# Patient Record
Sex: Female | Born: 1940
Health system: Southern US, Community
[De-identification: ages and names within clinical notes are randomized; demographics above are authoritative.]

## PROBLEM LIST (undated history)

## (undated) HISTORY — PX: APPENDECTOMY: SHX54

## (undated) HISTORY — PX: FOOT SURGERY: SHX648

## (undated) HISTORY — PX: CHOLECYSTECTOMY: SHX55

## (undated) HISTORY — PX: OTHER SURGICAL HISTORY: SHX169

---

## 1999-01-30 ENCOUNTER — Other Ambulatory Visit: Admission: RE | Admit: 1999-01-30 | Discharge: 1999-01-30 | Payer: Self-pay | Admitting: Family Medicine

## 2002-05-28 ENCOUNTER — Other Ambulatory Visit: Admission: RE | Admit: 2002-05-28 | Discharge: 2002-05-28 | Payer: Self-pay | Admitting: Family Medicine

## 2002-12-24 ENCOUNTER — Encounter: Admission: RE | Admit: 2002-12-24 | Discharge: 2002-12-24 | Payer: Self-pay | Admitting: Internal Medicine

## 2004-03-06 ENCOUNTER — Ambulatory Visit: Payer: Self-pay | Admitting: Family Medicine

## 2004-04-20 ENCOUNTER — Ambulatory Visit: Payer: Self-pay | Admitting: Family Medicine

## 2004-04-23 ENCOUNTER — Ambulatory Visit: Payer: Self-pay | Admitting: Family Medicine

## 2004-05-04 ENCOUNTER — Ambulatory Visit: Payer: Self-pay | Admitting: Family Medicine

## 2004-12-25 ENCOUNTER — Ambulatory Visit: Payer: Self-pay | Admitting: Family Medicine

## 2005-04-06 ENCOUNTER — Ambulatory Visit: Payer: Self-pay | Admitting: Family Medicine

## 2015-08-10 DIAGNOSIS — K219 Gastro-esophageal reflux disease without esophagitis: Secondary | ICD-10-CM

## 2015-08-10 DIAGNOSIS — J309 Allergic rhinitis, unspecified: Secondary | ICD-10-CM

## 2015-08-10 DIAGNOSIS — F3340 Major depressive disorder, recurrent, in remission, unspecified: Secondary | ICD-10-CM

## 2015-08-10 DIAGNOSIS — M1990 Unspecified osteoarthritis, unspecified site: Secondary | ICD-10-CM

## 2015-08-10 DIAGNOSIS — G894 Chronic pain syndrome: Secondary | ICD-10-CM

## 2015-08-10 HISTORY — DX: Allergic rhinitis, unspecified: J30.9

## 2015-08-10 HISTORY — DX: Major depressive disorder, recurrent, in remission, unspecified: F33.40

## 2015-08-10 HISTORY — DX: Unspecified osteoarthritis, unspecified site: M19.90

## 2015-08-10 HISTORY — DX: Gastro-esophageal reflux disease without esophagitis: K21.9

## 2015-08-10 HISTORY — DX: Chronic pain syndrome: G89.4

## 2015-08-18 DIAGNOSIS — I10 Essential (primary) hypertension: Secondary | ICD-10-CM

## 2015-08-18 HISTORY — DX: Essential (primary) hypertension: I10

## 2015-10-03 DIAGNOSIS — Z131 Encounter for screening for diabetes mellitus: Secondary | ICD-10-CM | POA: Insufficient documentation

## 2015-10-03 DIAGNOSIS — E782 Mixed hyperlipidemia: Secondary | ICD-10-CM

## 2015-10-03 DIAGNOSIS — M810 Age-related osteoporosis without current pathological fracture: Secondary | ICD-10-CM

## 2015-10-03 HISTORY — DX: Mixed hyperlipidemia: E78.2

## 2015-10-03 HISTORY — DX: Age-related osteoporosis without current pathological fracture: M81.0

## 2015-10-03 HISTORY — DX: Encounter for screening for diabetes mellitus: Z13.1

## 2016-07-17 DIAGNOSIS — J0100 Acute maxillary sinusitis, unspecified: Secondary | ICD-10-CM | POA: Diagnosis not present

## 2016-07-17 DIAGNOSIS — J209 Acute bronchitis, unspecified: Secondary | ICD-10-CM | POA: Diagnosis not present

## 2016-08-18 DIAGNOSIS — M722 Plantar fascial fibromatosis: Secondary | ICD-10-CM

## 2016-08-18 HISTORY — DX: Plantar fascial fibromatosis: M72.2

## 2016-08-21 DIAGNOSIS — I10 Essential (primary) hypertension: Secondary | ICD-10-CM | POA: Diagnosis not present

## 2016-08-21 DIAGNOSIS — R739 Hyperglycemia, unspecified: Secondary | ICD-10-CM | POA: Diagnosis not present

## 2016-08-29 DIAGNOSIS — F3340 Major depressive disorder, recurrent, in remission, unspecified: Secondary | ICD-10-CM | POA: Diagnosis not present

## 2016-08-29 DIAGNOSIS — G894 Chronic pain syndrome: Secondary | ICD-10-CM | POA: Diagnosis not present

## 2016-08-29 DIAGNOSIS — R7303 Prediabetes: Secondary | ICD-10-CM | POA: Diagnosis not present

## 2016-08-29 DIAGNOSIS — K219 Gastro-esophageal reflux disease without esophagitis: Secondary | ICD-10-CM | POA: Diagnosis not present

## 2016-08-29 DIAGNOSIS — Z13220 Encounter for screening for lipoid disorders: Secondary | ICD-10-CM | POA: Diagnosis not present

## 2016-08-29 DIAGNOSIS — Z298 Encounter for other specified prophylactic measures: Secondary | ICD-10-CM | POA: Diagnosis not present

## 2016-09-19 DIAGNOSIS — Z1231 Encounter for screening mammogram for malignant neoplasm of breast: Secondary | ICD-10-CM | POA: Diagnosis not present

## 2017-03-01 DIAGNOSIS — K219 Gastro-esophageal reflux disease without esophagitis: Secondary | ICD-10-CM | POA: Diagnosis not present

## 2017-03-01 DIAGNOSIS — Z131 Encounter for screening for diabetes mellitus: Secondary | ICD-10-CM | POA: Diagnosis not present

## 2017-03-01 DIAGNOSIS — R7303 Prediabetes: Secondary | ICD-10-CM | POA: Diagnosis not present

## 2017-03-01 DIAGNOSIS — F3340 Major depressive disorder, recurrent, in remission, unspecified: Secondary | ICD-10-CM | POA: Diagnosis not present

## 2017-03-01 DIAGNOSIS — I10 Essential (primary) hypertension: Secondary | ICD-10-CM | POA: Diagnosis not present

## 2017-03-01 DIAGNOSIS — Z13220 Encounter for screening for lipoid disorders: Secondary | ICD-10-CM | POA: Diagnosis not present

## 2017-03-01 DIAGNOSIS — G894 Chronic pain syndrome: Secondary | ICD-10-CM | POA: Diagnosis not present

## 2017-03-08 DIAGNOSIS — Z961 Presence of intraocular lens: Secondary | ICD-10-CM | POA: Diagnosis not present

## 2017-06-03 DIAGNOSIS — Z9181 History of falling: Secondary | ICD-10-CM | POA: Diagnosis not present

## 2017-06-03 DIAGNOSIS — F419 Anxiety disorder, unspecified: Secondary | ICD-10-CM | POA: Diagnosis not present

## 2017-06-03 DIAGNOSIS — Z6825 Body mass index (BMI) 25.0-25.9, adult: Secondary | ICD-10-CM | POA: Diagnosis not present

## 2017-06-03 DIAGNOSIS — K219 Gastro-esophageal reflux disease without esophagitis: Secondary | ICD-10-CM | POA: Diagnosis not present

## 2017-06-03 DIAGNOSIS — J45909 Unspecified asthma, uncomplicated: Secondary | ICD-10-CM | POA: Diagnosis not present

## 2017-06-03 DIAGNOSIS — Z1389 Encounter for screening for other disorder: Secondary | ICD-10-CM | POA: Diagnosis not present

## 2017-06-03 DIAGNOSIS — M1990 Unspecified osteoarthritis, unspecified site: Secondary | ICD-10-CM | POA: Diagnosis not present

## 2017-06-03 DIAGNOSIS — Z1331 Encounter for screening for depression: Secondary | ICD-10-CM | POA: Diagnosis not present

## 2017-06-03 DIAGNOSIS — E663 Overweight: Secondary | ICD-10-CM | POA: Diagnosis not present

## 2017-06-03 DIAGNOSIS — H9193 Unspecified hearing loss, bilateral: Secondary | ICD-10-CM | POA: Diagnosis not present

## 2017-06-03 DIAGNOSIS — F329 Major depressive disorder, single episode, unspecified: Secondary | ICD-10-CM | POA: Diagnosis not present

## 2017-09-10 DIAGNOSIS — Z0000 Encounter for general adult medical examination without abnormal findings: Secondary | ICD-10-CM | POA: Diagnosis not present

## 2017-09-10 DIAGNOSIS — E782 Mixed hyperlipidemia: Secondary | ICD-10-CM | POA: Diagnosis not present

## 2017-09-10 DIAGNOSIS — G894 Chronic pain syndrome: Secondary | ICD-10-CM | POA: Diagnosis not present

## 2017-09-10 DIAGNOSIS — I10 Essential (primary) hypertension: Secondary | ICD-10-CM | POA: Diagnosis not present

## 2017-09-10 DIAGNOSIS — F3340 Major depressive disorder, recurrent, in remission, unspecified: Secondary | ICD-10-CM | POA: Diagnosis not present

## 2017-09-10 DIAGNOSIS — Z131 Encounter for screening for diabetes mellitus: Secondary | ICD-10-CM | POA: Diagnosis not present

## 2017-09-10 DIAGNOSIS — K219 Gastro-esophageal reflux disease without esophagitis: Secondary | ICD-10-CM | POA: Diagnosis not present

## 2017-09-14 DIAGNOSIS — R21 Rash and other nonspecific skin eruption: Secondary | ICD-10-CM | POA: Diagnosis not present

## 2017-09-14 DIAGNOSIS — S40869A Insect bite (nonvenomous) of unspecified upper arm, initial encounter: Secondary | ICD-10-CM | POA: Diagnosis not present

## 2017-11-04 ENCOUNTER — Ambulatory Visit (INDEPENDENT_AMBULATORY_CARE_PROVIDER_SITE_OTHER): Payer: Medicare Other

## 2017-11-04 ENCOUNTER — Encounter: Payer: Self-pay | Admitting: Podiatry

## 2017-11-04 ENCOUNTER — Ambulatory Visit (INDEPENDENT_AMBULATORY_CARE_PROVIDER_SITE_OTHER): Payer: Medicare Other | Admitting: Podiatry

## 2017-11-04 VITALS — BP 141/71 | HR 79 | Temp 98.9°F | Resp 16 | Ht 66.0 in | Wt 160.0 lb

## 2017-11-04 DIAGNOSIS — M2041 Other hammer toe(s) (acquired), right foot: Secondary | ICD-10-CM

## 2017-11-04 DIAGNOSIS — Q828 Other specified congenital malformations of skin: Secondary | ICD-10-CM

## 2017-11-04 DIAGNOSIS — M2011 Hallux valgus (acquired), right foot: Secondary | ICD-10-CM | POA: Diagnosis not present

## 2017-11-04 NOTE — Progress Notes (Signed)
   Subjective:    Patient ID: Maria Navarro, female    DOB: August 06, 1940, 77 y.o.   MRN: [id number]  HPI    Review of Systems  All other systems reviewed and are negative.      Objective:   Physical Exam        Assessment & Plan:

## 2017-11-20 NOTE — Progress Notes (Signed)
  Subjective:  Patient ID: Maria Navarro, female    DOB: 1941-02-06,  MRN: [id number]  Chief Complaint  Patient presents with  . Callouses    R hallux lateral side and 2nd toe medial side x 15 years; tender Tx: trimming  and OTC padding   77 y.o. female presents with the above complaint.  Reports pain to the outside of the right great toe.  And inside of the second toe.  Present for 15 years tender.  Reports she gets calluses to the area.  Has tried trimming and over-the-counter pads. ABN on file.  No past medical history on file.   Current Outpatient Medications:  .  aspirin 81 MG chewable tablet, Chew by mouth daily., Disp: , Rfl:  .  buPROPion (WELLBUTRIN XL) 300 MG 24 hr tablet, Take 300 mg by mouth daily., Disp: , Rfl:  .  traMADol (ULTRAM) 50 MG tablet, Take by mouth every 6 (six) hours as needed., Disp: , Rfl:   Not on File Review of Systems: Negative except as noted in the HPI. Denies N/V/F/Ch. Objective:   Vitals:   11/04/17 1511  BP: (!) 141/71  Pulse: 79  Resp: 16  Temp: 98.9 F (37.2 C)   General AA&O x3. Normal mood and affect.  Vascular Dorsalis pedis and posterior tibial pulses  present 2+ bilaterally  Capillary refill normal to all digits. Pedal hair growth normal.  Neurologic Epicritic sensation grossly present.  Dermatologic No open lesions. Interspaces clear of maceration. Nails well groomed and normal in appearance.  Orthopedic: MMT 5/5 in dorsiflexion, plantarflexion, inversion, and eversion. Normal joint ROM without pain or crepitus.   Assessment & Plan:  Patient was evaluated and treated and all questions answered.  Heloma molle first second toes right foot.  Underlying hammertoe deformity second toe -X-rays taken reviewed.  Evidence of prior bunionectomy.  Degenerative changes noted to the first MPJ head.  Cerclage wire present.  Lesion markers pointing to the effacing sides of the first IPJ and second PIPJ -Lesion palliatively pared.s discussed  with patient that due to the above deformities would benefit from surgery to straighten the first toe.  Would likely benefit from first MPJ replacement with second hammertoe correction.  Patient states that she cannot pursue surgery at this time she has no one to assist her.  We will plan for surgery early next year.  Procedure: Paring of Lesion Rationale: painful hyperkeratotic lesion Type of Debridement: manual, sharp debridement. Instrumentation: 312 blade Number of Lesions: 2   Return in about 2 months (around 01/05/2018) for surgery planning.

## 2017-12-12 ENCOUNTER — Other Ambulatory Visit: Payer: Self-pay | Admitting: Podiatry

## 2017-12-12 DIAGNOSIS — M2011 Hallux valgus (acquired), right foot: Secondary | ICD-10-CM

## 2017-12-12 DIAGNOSIS — Q828 Other specified congenital malformations of skin: Secondary | ICD-10-CM

## 2017-12-12 DIAGNOSIS — M2041 Other hammer toe(s) (acquired), right foot: Secondary | ICD-10-CM

## 2018-01-06 ENCOUNTER — Ambulatory Visit (INDEPENDENT_AMBULATORY_CARE_PROVIDER_SITE_OTHER): Payer: Medicare Other | Admitting: Podiatry

## 2018-01-06 DIAGNOSIS — Q828 Other specified congenital malformations of skin: Secondary | ICD-10-CM | POA: Diagnosis not present

## 2018-01-06 DIAGNOSIS — M2011 Hallux valgus (acquired), right foot: Secondary | ICD-10-CM | POA: Diagnosis not present

## 2018-01-06 DIAGNOSIS — M2041 Other hammer toe(s) (acquired), right foot: Secondary | ICD-10-CM | POA: Diagnosis not present

## 2018-01-06 DIAGNOSIS — T8484XA Pain due to internal orthopedic prosthetic devices, implants and grafts, initial encounter: Secondary | ICD-10-CM

## 2018-01-06 DIAGNOSIS — M205X1 Other deformities of toe(s) (acquired), right foot: Secondary | ICD-10-CM

## 2018-01-06 DIAGNOSIS — M2021 Hallux rigidus, right foot: Secondary | ICD-10-CM | POA: Diagnosis not present

## 2018-01-06 NOTE — Progress Notes (Signed)
  Subjective:  Patient ID: Maria Navarro, female    DOB: 1940-08-30,  MRN: [id number]  Chief Complaint  Patient presents with  . surgery consultation    Surgery consulation for 1st MPJ replacement and 2nd HT correction    77 y.o. female presents with the above complaint.  Still having pain with the first and second toes especially due to rubbing.  Would like to plan for surgery mid November.  Review of Systems: Negative except as noted in the HPI. Denies N/V/F/Ch.  No past medical history on file.  Current Outpatient Medications:  .  aspirin 81 MG chewable tablet, Chew by mouth daily., Disp: , Rfl:  .  buPROPion (WELLBUTRIN XL) 300 MG 24 hr tablet, Take 300 mg by mouth daily., Disp: , Rfl:  .  traMADol (ULTRAM) 50 MG tablet, Take by mouth every 6 (six) hours as needed., Disp: , Rfl:   Social History   Tobacco Use  Smoking Status Never Smoker  Smokeless Tobacco Never Used    Not on File Objective:  There were no vitals filed for this visit. There is no height or weight on file to calculate BMI. Constitutional Well developed. Well nourished.  Vascular Dorsalis pedis pulses palpable bilaterally. Posterior tibial pulses palpable bilaterally. Capillary refill normal to all digits.  No cyanosis or clubbing noted. Pedal hair growth normal.  Neurologic Normal speech. Oriented to person, place, and time. Epicritic sensation to light touch grossly present bilaterally.  Dermatologic Nails well groomed and normal in appearance. No open wounds. Hyperkeratotic lesions to the lateral hallux IPJ medial second PIPJ  Orthopedic: Normal joint ROM without pain or crepitus bilaterally. HAV deformity right with second hammertoe.   Radiographs: None today prior x-rays reviewed. Assessment:   1. Hallux valgus, right   2. Hammer toe of right foot   3. Porokeratosis   4. Contracture of toe of right foot   5. Hallux rigidus of right foot   6. Painful orthopaedic hardware [facility])    Plan:    Patient was evaluated and treated and all questions answered.  Hallux valgus, hallux limitus, hammertoe second toe right foot -Prior x-rays reviewed. -Discussed surgical plan with patient.  Would benefit from first metatarsophalangeal joint replacement to correct her bunion deformity.  Would benefit from removal of the cerclage wire hardware.  Would benefit from second hammertoe correction with pin versus screw fixation.  Would consider screw for faster recovery.  Given consistent callus formation would benefit from hemi-condylectomy of the hallux to prevent further callus formation. -Patient has failed all conservative therapy and wishes to proceed with surgical intervention. All risks, benefits, and alternatives discussed with patient. No guarantees given. Consent reviewed and signed by patient. -Planned procedures: Right first metatarsal phalangeal joint replacement.  Second hammertoe correction with pin fixation versus screw.  Removal of hardware.  Return for aftter surgery.

## 2018-01-06 NOTE — Patient Instructions (Signed)
Pre-Operative Instructions  Congratulations, you have decided to take an important step towards improving your quality of life.  You can be assured that the doctors and staff at [facility] will be with you every step of the way.  Here are some important things you should know:  1. Plan to be at the surgery center/hospital at least 1 (one) hour prior to your scheduled time, unless otherwise directed by the surgical center/hospital staff.  You must have a responsible adult accompany you, remain during the surgery and drive you home.  Make sure you have directions to the surgical center/hospital to ensure you arrive on time. 2. If you are having surgery at [facility] or [facility], you will need a copy of your medical history and physical form from your family physician within one month prior to the date of surgery. We will give you a form for your primary physician to complete.  3. We make every effort to accommodate the date you request for surgery.  However, there are times where surgery dates or times have to be moved.  We will contact you as soon as possible if a change in schedule is required.   4. No aspirin/ibuprofen for one week before surgery.  If you are on aspirin, any non-steroidal anti-inflammatory medications (Mobic, Aleve, Ibuprofen) should not be taken seven (7) days prior to your surgery.  You make take Tylenol for pain prior to surgery.  5. Medications - If you are taking daily heart and blood pressure medications, seizure, reflux, allergy, asthma, anxiety, pain or diabetes medications, make sure you notify the surgery center/hospital before the day of surgery so they can tell you which medications you should take or avoid the day of surgery. 6. No food or drink after midnight the night before surgery unless directed otherwise by surgical center/hospital staff. 7. No alcoholic beverages 24-hours prior to surgery.  No smoking 24-hours prior or 24-hours after  surgery. 8. Wear loose pants or shorts. They should be loose enough to fit over bandages, boots, and casts. 9. Don't wear slip-on shoes. Sneakers are preferred. 10. Bring your boot with you to the surgery center/hospital.  Also bring crutches or a walker if your physician has prescribed it for you.  If you do not have this equipment, it will be provided for you after surgery. 11. If you have not been contacted by the surgery center/hospital by the day before your surgery, call to confirm the date and time of your surgery. 12. Leave-time from work may vary depending on the type of surgery you have.  Appropriate arrangements should be made prior to surgery with your employer. 13. Prescriptions will be provided immediately following surgery by your doctor.  Fill these as soon as possible after surgery and take the medication as directed. Pain medications will not be refilled on weekends and must be approved by the doctor. 14. Remove nail polish on the operative foot and avoid getting pedicures prior to surgery. 15. Wash the night before surgery.  The night before surgery wash the foot and leg well with water and the antibacterial soap provided. Be sure to pay special attention to beneath the toenails and in between the toes.  Wash for at least three (3) minutes. Rinse thoroughly with water and dry well with a towel.  Perform this wash unless told not to do so by your physician.  Enclosed: 1 Ice pack (please put in freezer the night before surgery)   1 Hibiclens skin cleaner     Pre-op instructions  If you have any questions regarding the instructions, please do not hesitate to call our office.  [place]: [address], [place], [place] [address] -- [phone]  [place]: [address] [place], [place] [address] -- [phone]  [place]: [address]  [place], [place] [address] -- [phone]  [place]: [address], [address], [place], [place] [address] -- [phone]  Website:  [email or web address] 

## 2018-01-08 ENCOUNTER — Telehealth: Payer: Self-pay | Admitting: *Deleted

## 2018-01-08 NOTE — Telephone Encounter (Signed)
"  I'm calling to schedule my surgery with Dr. Samuella Cota."  Have you signed consent forms?  "Yes, I have."  I need to call you back because I don't have your paperwork at this time.  Did Dr. Samuella Cota tell you which location?  "[facility] is what he told me.  I'd like to do it in mid-October."  I'll give you a call once I get your surgery information.

## 2018-01-09 NOTE — Telephone Encounter (Signed)
"  I'm calling to get a date for [facility] surgery at [facility]."  I can get a tentative date for you.  I can't guarantee because Dr. Samuella CotaPrice doesn't have any block time at [facility].  Do you have a date in mind?  "I'd like to do it mid-October."  He could possibly do it on October 21, 20, 15, or 14.  "I'd like to do it on October 21."  I can't guarantee the date.  Like I mentioned he does not have any block time at [facility].  I would not be able to verify it until the week before the surgery date.  "Well that is not helpful.  Can he do it in [place] quicker and without all these problems and on that date?"  Yes, he can do it at [facility] on October 23.  You will not need the history and physical form.  "Okay, let's do it there.  My surgery will be done that morning?"  Yes, your surgery will be done that morning.  "Anything I need to do?" Can you go by the [place] office to pick up the surgical center's brochure?  "Yes, I can go down there and pick it up."  You need to register with the surgical center online, instructions are in the brochure.  "I don't have a computer."  Well, they will call you to get the needed information.  "Okay, thank you so much."

## 2018-02-11 DIAGNOSIS — M791 Myalgia, unspecified site: Secondary | ICD-10-CM | POA: Diagnosis not present

## 2018-02-11 DIAGNOSIS — M9905 Segmental and somatic dysfunction of pelvic region: Secondary | ICD-10-CM | POA: Diagnosis not present

## 2018-02-11 DIAGNOSIS — M9902 Segmental and somatic dysfunction of thoracic region: Secondary | ICD-10-CM | POA: Diagnosis not present

## 2018-02-11 DIAGNOSIS — M9904 Segmental and somatic dysfunction of sacral region: Secondary | ICD-10-CM | POA: Diagnosis not present

## 2018-02-11 DIAGNOSIS — M9901 Segmental and somatic dysfunction of cervical region: Secondary | ICD-10-CM | POA: Diagnosis not present

## 2018-02-11 DIAGNOSIS — M50322 Other cervical disc degeneration at C5-C6 level: Secondary | ICD-10-CM | POA: Diagnosis not present

## 2018-02-11 DIAGNOSIS — M50321 Other cervical disc degeneration at C4-C5 level: Secondary | ICD-10-CM | POA: Diagnosis not present

## 2018-02-11 DIAGNOSIS — M5383 Other specified dorsopathies, cervicothoracic region: Secondary | ICD-10-CM | POA: Diagnosis not present

## 2018-02-12 DIAGNOSIS — M5383 Other specified dorsopathies, cervicothoracic region: Secondary | ICD-10-CM | POA: Diagnosis not present

## 2018-02-12 DIAGNOSIS — M9905 Segmental and somatic dysfunction of pelvic region: Secondary | ICD-10-CM | POA: Diagnosis not present

## 2018-02-12 DIAGNOSIS — M9904 Segmental and somatic dysfunction of sacral region: Secondary | ICD-10-CM | POA: Diagnosis not present

## 2018-02-12 DIAGNOSIS — M50321 Other cervical disc degeneration at C4-C5 level: Secondary | ICD-10-CM | POA: Diagnosis not present

## 2018-02-12 DIAGNOSIS — M50322 Other cervical disc degeneration at C5-C6 level: Secondary | ICD-10-CM | POA: Diagnosis not present

## 2018-02-12 DIAGNOSIS — M9901 Segmental and somatic dysfunction of cervical region: Secondary | ICD-10-CM | POA: Diagnosis not present

## 2018-02-12 DIAGNOSIS — M9902 Segmental and somatic dysfunction of thoracic region: Secondary | ICD-10-CM | POA: Diagnosis not present

## 2018-02-12 DIAGNOSIS — M791 Myalgia, unspecified site: Secondary | ICD-10-CM | POA: Diagnosis not present

## 2018-02-18 ENCOUNTER — Other Ambulatory Visit: Payer: Self-pay | Admitting: *Deleted

## 2018-02-18 DIAGNOSIS — M2011 Hallux valgus (acquired), right foot: Secondary | ICD-10-CM

## 2018-02-18 DIAGNOSIS — M2021 Hallux rigidus, right foot: Secondary | ICD-10-CM

## 2018-02-18 DIAGNOSIS — M2041 Other hammer toe(s) (acquired), right foot: Secondary | ICD-10-CM

## 2018-02-19 ENCOUNTER — Other Ambulatory Visit: Payer: Self-pay | Admitting: Podiatry

## 2018-02-19 DIAGNOSIS — Z4889 Encounter for other specified surgical aftercare: Secondary | ICD-10-CM

## 2018-02-19 DIAGNOSIS — M2041 Other hammer toe(s) (acquired), right foot: Secondary | ICD-10-CM

## 2018-02-19 DIAGNOSIS — M24574 Contracture, right foot: Secondary | ICD-10-CM | POA: Diagnosis not present

## 2018-02-19 DIAGNOSIS — K219 Gastro-esophageal reflux disease without esophagitis: Secondary | ICD-10-CM | POA: Diagnosis not present

## 2018-02-19 DIAGNOSIS — M2021 Hallux rigidus, right foot: Secondary | ICD-10-CM

## 2018-02-19 DIAGNOSIS — T8484XA Pain due to internal orthopedic prosthetic devices, implants and grafts, initial encounter: Secondary | ICD-10-CM | POA: Diagnosis not present

## 2018-02-19 DIAGNOSIS — Z472 Encounter for removal of internal fixation device: Secondary | ICD-10-CM | POA: Diagnosis not present

## 2018-02-19 DIAGNOSIS — M7751 Other enthesopathy of right foot: Secondary | ICD-10-CM

## 2018-02-19 MED ORDER — HYDROCODONE-ACETAMINOPHEN 10-325 MG PO TABS: 1.0000 | ORAL_TABLET | ORAL | 0 refills | Status: DC | PRN

## 2018-02-19 MED ORDER — ONDANSETRON HCL 4 MG PO TABS: 4.0000 mg | ORAL_TABLET | Freq: Three times a day (TID) | ORAL | 0 refills | Status: DC | PRN

## 2018-02-19 MED ORDER — CEPHALEXIN 500 MG PO CAPS: 500.0000 mg | ORAL_CAPSULE | Freq: Two times a day (BID) | ORAL | 0 refills | Status: DC

## 2018-02-19 NOTE — Progress Notes (Signed)
Rx sent to pharmacy for outpatient surgery. °

## 2018-02-20 DIAGNOSIS — M2011 Hallux valgus (acquired), right foot: Secondary | ICD-10-CM | POA: Diagnosis not present

## 2018-02-20 DIAGNOSIS — Z96698 Presence of other orthopedic joint implants: Secondary | ICD-10-CM | POA: Diagnosis not present

## 2018-02-20 DIAGNOSIS — L565 Disseminated superficial actinic porokeratosis (DSAP): Secondary | ICD-10-CM | POA: Diagnosis not present

## 2018-02-20 DIAGNOSIS — Z471 Aftercare following joint replacement surgery: Secondary | ICD-10-CM | POA: Diagnosis not present

## 2018-02-21 DIAGNOSIS — M2011 Hallux valgus (acquired), right foot: Secondary | ICD-10-CM | POA: Diagnosis not present

## 2018-02-21 DIAGNOSIS — Z471 Aftercare following joint replacement surgery: Secondary | ICD-10-CM | POA: Diagnosis not present

## 2018-02-21 DIAGNOSIS — Z96698 Presence of other orthopedic joint implants: Secondary | ICD-10-CM | POA: Diagnosis not present

## 2018-02-21 DIAGNOSIS — L565 Disseminated superficial actinic porokeratosis (DSAP): Secondary | ICD-10-CM | POA: Diagnosis not present

## 2018-02-24 ENCOUNTER — Ambulatory Visit (INDEPENDENT_AMBULATORY_CARE_PROVIDER_SITE_OTHER): Payer: Medicare Other | Admitting: Podiatry

## 2018-02-24 ENCOUNTER — Ambulatory Visit (INDEPENDENT_AMBULATORY_CARE_PROVIDER_SITE_OTHER): Payer: Medicare Other

## 2018-02-24 DIAGNOSIS — M2011 Hallux valgus (acquired), right foot: Secondary | ICD-10-CM

## 2018-02-24 DIAGNOSIS — L565 Disseminated superficial actinic porokeratosis (DSAP): Secondary | ICD-10-CM | POA: Diagnosis not present

## 2018-02-24 DIAGNOSIS — Z9889 Other specified postprocedural states: Secondary | ICD-10-CM

## 2018-02-24 DIAGNOSIS — M2021 Hallux rigidus, right foot: Secondary | ICD-10-CM

## 2018-02-24 DIAGNOSIS — M2041 Other hammer toe(s) (acquired), right foot: Secondary | ICD-10-CM

## 2018-02-24 DIAGNOSIS — Z96698 Presence of other orthopedic joint implants: Secondary | ICD-10-CM | POA: Diagnosis not present

## 2018-02-24 DIAGNOSIS — Z471 Aftercare following joint replacement surgery: Secondary | ICD-10-CM | POA: Diagnosis not present

## 2018-02-24 NOTE — Progress Notes (Signed)
  Subjective:  Patient ID: Maria Navarro, female    DOB: 1940-10-20,  MRN: [id number]  Chief Complaint  Patient presents with  . Routine Post Op    pov#1 dos 10.23.2019 Keller Bunion Implant Rt, Removal Fixation Deep Kwire/Screw Rt, Condylectomy Hallux Rt, Hammertoe Repair 2nd Rt    DOS: 02/19/18 Procedure: Lorenz Coaster Bunionectomy with Implant, Removal of Deep Fixation, Condylectomy Toe, Hammertoe Repair 2nd, Tenotomy and Capsulotomy  77 y.o. female returns for post-op check. Pain controlled, having issues with her boot but otherwiswe no pain or discomfort.  Review of Systems: Negative except as noted in the HPI. Denies N/V/F/Ch.  No past medical history on file.  Current Outpatient Medications:  .  aspirin 81 MG chewable tablet, Chew by mouth daily., Disp: , Rfl:  .  buPROPion (WELLBUTRIN XL) 300 MG 24 hr tablet, Take 300 mg by mouth daily., Disp: , Rfl:  .  cephALEXin (KEFLEX) 500 MG capsule, Take 1 capsule (500 mg total) by mouth 2 (two) times daily., Disp: 14 capsule, Rfl: 0 .  HYDROcodone-acetaminophen (NORCO) 10-325 MG tablet, Take 1 tablet by mouth every 4 (four) hours as needed., Disp: 20 tablet, Rfl: 0 .  ondansetron (ZOFRAN) 4 MG tablet, Take 1 tablet (4 mg total) by mouth every 8 (eight) hours as needed for nausea or vomiting., Disp: 20 tablet, Rfl: 0 .  traMADol (ULTRAM) 50 MG tablet, Take by mouth every 6 (six) hours as needed., Disp: , Rfl:   Social History   Tobacco Use  Smoking Status Never Smoker  Smokeless Tobacco Never Used    Not on File Objective:  There were no vitals filed for this visit. There is no height or weight on file to calculate BMI. Constitutional Well developed. Well nourished.  Vascular Foot warm and well perfused. Capillary refill normal to all digits.   Neurologic Normal speech. Oriented to person, place, and time. Epicritic sensation to light touch grossly present bilaterally.  Dermatologic Skin healing well without signs of infection. Skin  edges well coapted without signs of infection.  Orthopedic: Tenderness to palpation noted about the surgical site.   Radiographs: Taken and reviewed c/w post-op state Assessment:   1. Acquired hallux valgus of right foot   2. Hallux rigidus of right foot   3. Hammer toe of right foot   4. Status post right foot surgery    Plan:  Patient was evaluated and treated and all questions answered.  S/p foot surgery right -Progressing as expected post-operatively. -XR: As above -WB Status: WBAT in CAM Boot -Sutures: intact. -Medications: none refilled. -Foot redressed.  No follow-ups on file.

## 2018-02-25 DIAGNOSIS — L565 Disseminated superficial actinic porokeratosis (DSAP): Secondary | ICD-10-CM | POA: Diagnosis not present

## 2018-02-25 DIAGNOSIS — M2011 Hallux valgus (acquired), right foot: Secondary | ICD-10-CM | POA: Diagnosis not present

## 2018-02-25 DIAGNOSIS — Z96698 Presence of other orthopedic joint implants: Secondary | ICD-10-CM | POA: Diagnosis not present

## 2018-02-25 DIAGNOSIS — Z471 Aftercare following joint replacement surgery: Secondary | ICD-10-CM | POA: Diagnosis not present

## 2018-02-26 ENCOUNTER — Telehealth: Payer: Self-pay | Admitting: Podiatry

## 2018-02-26 DIAGNOSIS — Z96698 Presence of other orthopedic joint implants: Secondary | ICD-10-CM | POA: Diagnosis not present

## 2018-02-26 DIAGNOSIS — M2011 Hallux valgus (acquired), right foot: Secondary | ICD-10-CM | POA: Diagnosis not present

## 2018-02-26 DIAGNOSIS — Z471 Aftercare following joint replacement surgery: Secondary | ICD-10-CM | POA: Diagnosis not present

## 2018-02-26 DIAGNOSIS — L565 Disseminated superficial actinic porokeratosis (DSAP): Secondary | ICD-10-CM | POA: Diagnosis not present

## 2018-02-26 DIAGNOSIS — M2041 Other hammer toe(s) (acquired), right foot: Secondary | ICD-10-CM

## 2018-02-26 DIAGNOSIS — Z9889 Other specified postprocedural states: Secondary | ICD-10-CM

## 2018-02-26 NOTE — Addendum Note (Signed)
Addended by: Alphia Kava D on: 02/26/2018 10:57 AM   Modules accepted: Orders

## 2018-02-26 NOTE — Telephone Encounter (Signed)
Faxed order for knee walker to [facility].

## 2018-02-26 NOTE — Telephone Encounter (Signed)
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 I'm calling to see if we can get a script faxed to [place] for a knee walker as she is having trouble using a normal walker because of her shoulder problem. Our fax number is [phone].

## 2018-03-03 ENCOUNTER — Encounter: Payer: Self-pay | Admitting: Podiatry

## 2018-03-03 ENCOUNTER — Ambulatory Visit (INDEPENDENT_AMBULATORY_CARE_PROVIDER_SITE_OTHER): Payer: Medicare Other | Admitting: Podiatry

## 2018-03-03 VITALS — BP 145/74 | HR 74 | Temp 97.6°F | Resp 16

## 2018-03-03 DIAGNOSIS — Z9889 Other specified postprocedural states: Secondary | ICD-10-CM

## 2018-03-03 MED ORDER — HYDROCODONE-ACETAMINOPHEN 5-325 MG PO TABS: 1.0000 | ORAL_TABLET | Freq: Four times a day (QID) | ORAL | 0 refills | Status: DC | PRN

## 2018-03-03 NOTE — Progress Notes (Signed)
  Subjective:  Patient ID: Maria Navarro, female    DOB: 30-Jan-1941,  MRN: [id number]  Chief Complaint  Patient presents with  . Routine Post Op    PT. states," it's doing good, it'll swell up some and with a little soreness, otherwise it's doing fine." Tx: cam boot, and pain med -pt dneies N/V/F?Ch     DOS: 02/19/18 Procedure: Lorenz Coaster Bunionectomy with Implant, Removal of Deep Fixation, Condylectomy Toe, Hammertoe Repair 2nd, Tenotomy and Capsulotomy  77 y.o. female returns for post-op check. History as above. States she fell last week at home.  Review of Systems: Negative except as noted in the HPI. Denies N/V/F/Ch.  No past medical history on file.  Current Outpatient Medications:  .  aspirin 81 MG chewable tablet, Chew by mouth daily., Disp: , Rfl:  .  buPROPion (WELLBUTRIN XL) 300 MG 24 hr tablet, Take 300 mg by mouth daily., Disp: , Rfl:  .  cephALEXin (KEFLEX) 500 MG capsule, Take 1 capsule (500 mg total) by mouth 2 (two) times daily., Disp: 14 capsule, Rfl: 0 .  FLUAD 0.5 ML SUSY, , Disp: , Rfl:  .  ondansetron (ZOFRAN) 4 MG tablet, Take 1 tablet (4 mg total) by mouth every 8 (eight) hours as needed for nausea or vomiting., Disp: 20 tablet, Rfl: 0 .  traMADol (ULTRAM) 50 MG tablet, Take by mouth every 6 (six) hours as needed., Disp: , Rfl:  .  HYDROcodone-acetaminophen (NORCO) 5-325 MG tablet, Take 1 tablet by mouth every 6 (six) hours as needed for moderate pain., Disp: 30 tablet, Rfl: 0  Social History   Tobacco Use  Smoking Status Never Smoker  Smokeless Tobacco Never Used    Not on File Objective:   Vitals:   03/03/18 1324  BP: (!) 145/74  Pulse: 74  Resp: 16  Temp: 97.6 F (36.4 C)   There is no height or weight on file to calculate BMI. Constitutional Well developed. Well nourished.  Vascular Foot warm and well perfused. Capillary refill normal to all digits.   Neurologic Normal speech. Oriented to person, place, and time. Epicritic sensation to light  touch grossly present bilaterally.  Dermatologic Skin healing well without signs of infection. Skin edges well coapted without signs of infection.  Orthopedic: Tenderness to palpation noted about the surgical site.   Radiographs: Taken and reviewed c/w post-op state Assessment:   1. Status post right foot surgery    Plan:  Patient was evaluated and treated and all questions answered.  S/p foot surgery right -Progressing as expected post-operatively. -XR: As above -WB Status: WBAT in CAM Boot -Sutures: intact. -Medications: pain med refilled, lowered to Norco 5. -Foot redressed.  Return in about 1 week (around 03/10/2018) for Post-op. For suture removal

## 2018-03-04 DIAGNOSIS — M2011 Hallux valgus (acquired), right foot: Secondary | ICD-10-CM | POA: Diagnosis not present

## 2018-03-04 DIAGNOSIS — L565 Disseminated superficial actinic porokeratosis (DSAP): Secondary | ICD-10-CM | POA: Diagnosis not present

## 2018-03-04 DIAGNOSIS — Z471 Aftercare following joint replacement surgery: Secondary | ICD-10-CM | POA: Diagnosis not present

## 2018-03-04 DIAGNOSIS — Z96698 Presence of other orthopedic joint implants: Secondary | ICD-10-CM | POA: Diagnosis not present

## 2018-03-06 DIAGNOSIS — M2011 Hallux valgus (acquired), right foot: Secondary | ICD-10-CM | POA: Diagnosis not present

## 2018-03-06 DIAGNOSIS — Z96698 Presence of other orthopedic joint implants: Secondary | ICD-10-CM | POA: Diagnosis not present

## 2018-03-06 DIAGNOSIS — Z471 Aftercare following joint replacement surgery: Secondary | ICD-10-CM | POA: Diagnosis not present

## 2018-03-06 DIAGNOSIS — L565 Disseminated superficial actinic porokeratosis (DSAP): Secondary | ICD-10-CM | POA: Diagnosis not present

## 2018-03-10 ENCOUNTER — Encounter: Payer: Self-pay | Admitting: Podiatry

## 2018-03-10 ENCOUNTER — Ambulatory Visit (INDEPENDENT_AMBULATORY_CARE_PROVIDER_SITE_OTHER): Payer: Medicare Other | Admitting: Podiatry

## 2018-03-10 VITALS — BP 135/67 | HR 75 | Temp 98.2°F | Resp 16

## 2018-03-10 DIAGNOSIS — Z9889 Other specified postprocedural states: Secondary | ICD-10-CM

## 2018-03-10 DIAGNOSIS — M2011 Hallux valgus (acquired), right foot: Secondary | ICD-10-CM

## 2018-03-10 NOTE — Progress Notes (Signed)
  Subjective:  Patient ID: Maria Navarro, female    DOB: 1940-12-28,  MRN: [id number]  Chief Complaint  Patient presents with  . Routine Post Op    Pt. states," R 2nd toe is very tender, mostly at church and it still swells up." tx: narco, elevation and Cam boot -pt denies N/V/F/Ch    DOS: 02/19/18 Procedure: Lorenz Coaster Bunionectomy with Implant, Removal of Deep Fixation, Condylectomy Toe, Hammertoe Repair 2nd, Tenotomy and Capsulotomy  77 y.o. female returns for post-op check. History as above.   Review of Systems: Negative except as noted in the HPI. Denies N/V/F/Ch.  No past medical history on file.  Current Outpatient Medications:  .  aspirin 81 MG chewable tablet, Chew by mouth daily., Disp: , Rfl:  .  buPROPion (WELLBUTRIN XL) 300 MG 24 hr tablet, Take 300 mg by mouth daily., Disp: , Rfl:  .  cephALEXin (KEFLEX) 500 MG capsule, Take 1 capsule (500 mg total) by mouth 2 (two) times daily., Disp: 14 capsule, Rfl: 0 .  FLUAD 0.5 ML SUSY, , Disp: , Rfl:  .  HYDROcodone-acetaminophen (NORCO) 5-325 MG tablet, Take 1 tablet by mouth every 6 (six) hours as needed for moderate pain., Disp: 30 tablet, Rfl: 0 .  ondansetron (ZOFRAN) 4 MG tablet, Take 1 tablet (4 mg total) by mouth every 8 (eight) hours as needed for nausea or vomiting., Disp: 20 tablet, Rfl: 0 .  traMADol (ULTRAM) 50 MG tablet, Take by mouth every 6 (six) hours as needed., Disp: , Rfl:   Social History   Tobacco Use  Smoking Status Never Smoker  Smokeless Tobacco Never Used    Not on File Objective:   Vitals:   03/10/18 1338  BP: 135/67  Pulse: 75  Resp: 16  Temp: 98.2 F (36.8 C)   There is no height or weight on file to calculate BMI. Constitutional Well developed. Well nourished.  Vascular Foot warm and well perfused. Capillary refill normal to all digits.   Neurologic Normal speech. Oriented to person, place, and time. Epicritic sensation to light touch grossly present bilaterally.  Dermatologic Skin  healing well without signs of infection. Skin edges well coapted without signs of infection.  Orthopedic: Tenderness to palpation noted about the surgical site.   Radiographs: Taken and reviewed c/w post-op state Assessment:   1. Status post right foot surgery   2. Acquired hallux valgus of right foot    Plan:  Patient was evaluated and treated and all questions answered.  S/p foot surgery right -Progressing as expected post-operatively. -XR: As above -WB Status: WBAT in CAM Boot -Sutures: every other suture removed. -Medications: none refilled today -Foot redressed.  Return in about 1 week (around 03/17/2018) for Post-op.

## 2018-03-17 ENCOUNTER — Ambulatory Visit (INDEPENDENT_AMBULATORY_CARE_PROVIDER_SITE_OTHER): Payer: Medicare Other | Admitting: Podiatry

## 2018-03-17 ENCOUNTER — Ambulatory Visit (INDEPENDENT_AMBULATORY_CARE_PROVIDER_SITE_OTHER): Payer: Medicare Other

## 2018-03-17 DIAGNOSIS — Z9889 Other specified postprocedural states: Secondary | ICD-10-CM

## 2018-03-17 DIAGNOSIS — M7751 Other enthesopathy of right foot: Secondary | ICD-10-CM | POA: Diagnosis not present

## 2018-03-23 NOTE — Progress Notes (Signed)
  Subjective:  Patient ID: Maria Navarro, female    DOB: 10-12-1940,  MRN: [id number]  Chief Complaint  Patient presents with  . Routine Post Op    POst op return    DOS: 02/19/18 Procedure: Lorenz CoasterKeller Bunionectomy with Implant, Removal of Deep Fixation, Condylectomy Toe, Hammertoe Repair 2nd, Tenotomy and Capsulotomy  77 y.o. female returns for post-op check. History as above. Still having some pain in the second toe but otherwise doing okay.  Review of Systems: Negative except as noted in the HPI. Denies N/V/F/Ch.  No past medical history on file.  Current Outpatient Medications:  .  aspirin 81 MG chewable tablet, Chew by mouth daily., Disp: , Rfl:  .  buPROPion (WELLBUTRIN XL) 300 MG 24 hr tablet, Take 300 mg by mouth daily., Disp: , Rfl:  .  FLUAD 0.5 ML SUSY, , Disp: , Rfl:  .  HYDROcodone-acetaminophen (NORCO) 5-325 MG tablet, Take 1 tablet by mouth every 6 (six) hours as needed for moderate pain., Disp: 30 tablet, Rfl: 0 .  ondansetron (ZOFRAN) 4 MG tablet, Take 1 tablet (4 mg total) by mouth every 8 (eight) hours as needed for nausea or vomiting., Disp: 20 tablet, Rfl: 0 .  traMADol (ULTRAM) 50 MG tablet, Take by mouth every 6 (six) hours as needed., Disp: , Rfl:   Social History   Tobacco Use  Smoking Status Never Smoker  Smokeless Tobacco Never Used    Not on File Objective:   There were no vitals filed for this visit. There is no height or weight on file to calculate BMI. Constitutional Well developed. Well nourished.  Vascular Foot warm and well perfused. Capillary refill normal to all digits.   Neurologic Normal speech. Oriented to person, place, and time. Epicritic sensation to light touch grossly present bilaterally.  Dermatologic Skin healing well without signs of infection. Skin edges well coapted without signs of infection.  Orthopedic: Tenderness to palpation noted about the surgical site.   Radiographs: Taken and reviewed c/w post-op state Assessment:     1. Status post right foot surgery    Plan:  Patient was evaluated and treated and all questions answered.  S/p foot surgery right -Progressing as expected post-operatively. -XR: As above -WB Status: WBAT in surgical shoe -Sutures: remaining sutures removed, steris applied. -Medications: none refilled today -Foot redressed.  No follow-ups on file.

## 2018-03-31 ENCOUNTER — Encounter: Payer: Self-pay | Admitting: Podiatry

## 2018-03-31 ENCOUNTER — Ambulatory Visit (INDEPENDENT_AMBULATORY_CARE_PROVIDER_SITE_OTHER): Payer: Medicare Other | Admitting: Podiatry

## 2018-03-31 VITALS — BP 109/72 | HR 78 | Temp 97.7°F | Resp 16

## 2018-03-31 DIAGNOSIS — Z9889 Other specified postprocedural states: Secondary | ICD-10-CM

## 2018-03-31 DIAGNOSIS — M2011 Hallux valgus (acquired), right foot: Secondary | ICD-10-CM

## 2018-04-14 ENCOUNTER — Ambulatory Visit (INDEPENDENT_AMBULATORY_CARE_PROVIDER_SITE_OTHER): Payer: Medicare Other

## 2018-04-14 ENCOUNTER — Encounter: Payer: Self-pay | Admitting: Podiatry

## 2018-04-14 ENCOUNTER — Ambulatory Visit (INDEPENDENT_AMBULATORY_CARE_PROVIDER_SITE_OTHER): Payer: Medicare Other | Admitting: Podiatry

## 2018-04-14 VITALS — BP 145/70 | HR 78 | Temp 96.7°F | Resp 16

## 2018-04-14 DIAGNOSIS — E782 Mixed hyperlipidemia: Secondary | ICD-10-CM | POA: Diagnosis not present

## 2018-04-14 DIAGNOSIS — Z131 Encounter for screening for diabetes mellitus: Secondary | ICD-10-CM | POA: Diagnosis not present

## 2018-04-14 DIAGNOSIS — F3340 Major depressive disorder, recurrent, in remission, unspecified: Secondary | ICD-10-CM | POA: Diagnosis not present

## 2018-04-14 DIAGNOSIS — M2011 Hallux valgus (acquired), right foot: Secondary | ICD-10-CM

## 2018-04-14 DIAGNOSIS — K219 Gastro-esophageal reflux disease without esophagitis: Secondary | ICD-10-CM | POA: Diagnosis not present

## 2018-04-14 DIAGNOSIS — Z8249 Family history of ischemic heart disease and other diseases of the circulatory system: Secondary | ICD-10-CM

## 2018-04-14 DIAGNOSIS — Z9889 Other specified postprocedural states: Secondary | ICD-10-CM

## 2018-04-14 DIAGNOSIS — I10 Essential (primary) hypertension: Secondary | ICD-10-CM | POA: Diagnosis not present

## 2018-04-14 HISTORY — DX: Family history of ischemic heart disease and other diseases of the circulatory system: Z82.49

## 2018-04-14 NOTE — Progress Notes (Signed)
  Subjective:  Patient ID: Maria Navarro, female    DOB: October 17, 1940,  MRN: [id number]  Chief Complaint  Patient presents with  . Post-op Problem    Injury to sx toe: R hallux (bumped to agains step) x Tues; 7/10 stabbing-sharp pain, swelling, and light redness Tx: icing and neosporin Pt. states," my foot was doing very good before I bumped it." -pt deneis N/V/F?Ch   DOS: 02/19/18 Procedure: Lorenz CoasterKeller Bunionectomy with Implant, Removal of Deep Fixation, Condylectomy Toe, Hammertoe Repair 2nd, Tenotomy and Capsulotomy  77 y.o. female returns for post-op check. History as above. Was doing ok before she bumped it, with only slight pain in the second toe.  Review of Systems: Negative except as noted in the HPI. Denies N/V/F/Ch.  No past medical history on file.  Current Outpatient Medications:  .  aspirin 81 MG chewable tablet, Chew by mouth daily., Disp: , Rfl:  .  buPROPion (WELLBUTRIN XL) 300 MG 24 hr tablet, Take 300 mg by mouth daily., Disp: , Rfl:  .  FLUAD 0.5 ML SUSY, , Disp: , Rfl:  .  HYDROcodone-acetaminophen (NORCO) 5-325 MG tablet, Take 1 tablet by mouth every 6 (six) hours as needed for moderate pain., Disp: 30 tablet, Rfl: 0 .  ondansetron (ZOFRAN) 4 MG tablet, Take 1 tablet (4 mg total) by mouth every 8 (eight) hours as needed for nausea or vomiting., Disp: 20 tablet, Rfl: 0 .  traMADol (ULTRAM) 50 MG tablet, Take by mouth every 6 (six) hours as needed., Disp: , Rfl:   Social History   Tobacco Use  Smoking Status Never Smoker  Smokeless Tobacco Never Used    Not on File Objective:   Vitals:   04/14/18 0946  BP: (!) 145/70  Pulse: 78  Resp: 16  Temp: (!) 96.7 F (35.9 C)   There is no height or weight on file to calculate BMI. Constitutional Well developed. Well nourished.  Vascular Foot warm and well perfused. Capillary refill normal to all digits.   Neurologic Normal speech. Oriented to person, place, and time. Epicritic sensation to light touch grossly  present bilaterally.  Dermatologic Skin healing well without signs of infection. Skin edges well coapted without signs of infection.  Orthopedic: Tenderness to palpation noted about the surgical site.   Radiographs: Taken and reviewed evidence of screw loosening 2nd toe. Consolidation noted across the PIPJ. Assessment:   1. Status post right foot surgery   2. Acquired hallux valgus of right foot    Plan:  Patient was evaluated and treated and all questions answered.  S/p foot surgery right -Progressing as expected post-operatively. -XR: As above -WB Status: WBAT in surgical shoe, will continue in surgical shoe until after screw removal -Medications: none refilled today -Foot redressed.  Pain due to retained orthopedic hardware -XR taken screw loosening noted. No failure. -Having pain in the toe, likely from the screw -Will plan for minor procedure to remove the hardware. Advised it is best done in an OR but she would like to avoid having to go under anesthesia again and has difficulty finding someone to drive her home. Discussed we can do the procedure in the office but if the screw were to strip of have other complications we would have to do the procedure in the operating room. Patient verbalized understanding and wishes to proceed. Will plan for procedure to be done in the office.  No follow-ups on file.

## 2018-04-27 NOTE — Progress Notes (Signed)
  Subjective:  Patient ID: Maria Navarro, female    DOB: 04-17-1941,  MRN: [id number]  Chief Complaint  Patient presents with  . Routine Post Op    Pt. states," I think it's doing good, the only problem I'm having is the pin on my 2nd toe, it hurts when anyting pushes at the end of the toe." tx: toe splint, and post op shoes -pt deneis N/V?F?CH    DOS: 02/19/18 Procedure: Lorenz CoasterKeller Bunionectomy with Implant, Removal of Deep Fixation, Condylectomy Toe, Hammertoe Repair 2nd, Tenotomy and Capsulotomy  77 y.o. female returns for post-op check. History as above.  Review of Systems: Negative except as noted in the HPI. Denies N/V/F/Ch.  No past medical history on file.  Current Outpatient Medications:  .  aspirin 81 MG chewable tablet, Chew by mouth daily., Disp: , Rfl:  .  buPROPion (WELLBUTRIN XL) 300 MG 24 hr tablet, Take 300 mg by mouth daily., Disp: , Rfl:  .  FLUAD 0.5 ML SUSY, , Disp: , Rfl:  .  HYDROcodone-acetaminophen (NORCO) 5-325 MG tablet, Take 1 tablet by mouth every 6 (six) hours as needed for moderate pain., Disp: 30 tablet, Rfl: 0 .  ondansetron (ZOFRAN) 4 MG tablet, Take 1 tablet (4 mg total) by mouth every 8 (eight) hours as needed for nausea or vomiting., Disp: 20 tablet, Rfl: 0 .  traMADol (ULTRAM) 50 MG tablet, Take by mouth every 6 (six) hours as needed., Disp: , Rfl:   Social History   Tobacco Use  Smoking Status Never Smoker  Smokeless Tobacco Never Used    Not on File Objective:   Vitals:   03/31/18 1023  BP: 109/72  Pulse: 78  Resp: 16  Temp: 97.7 F (36.5 C)   There is no height or weight on file to calculate BMI. Constitutional Well developed. Well nourished.  Vascular Foot warm and well perfused. Capillary refill normal to all digits.   Neurologic Normal speech. Oriented to person, place, and time. Epicritic sensation to light touch grossly present bilaterally.  Dermatologic Skin healing well without signs of infection. Skin edges well coapted  without signs of infection.  Orthopedic: Tenderness to palpation noted about the surgical site.   Radiographs: Taken and reviewed c/w post-op state Assessment:   1. Status post right foot surgery   2. Acquired hallux valgus of right foot    Plan:  Patient was evaluated and treated and all questions answered.  S/p foot surgery right -Progressing as expected post-operatively. -XR: As above -WB Status: WBAT in surgical shoe -Sutures: out -Medications: none refilled today -Foot redressed.  Return in about 2 weeks (around 04/14/2018) for Post-op.

## 2018-05-05 ENCOUNTER — Ambulatory Visit (INDEPENDENT_AMBULATORY_CARE_PROVIDER_SITE_OTHER): Payer: Medicare Other | Admitting: Podiatry

## 2018-05-05 DIAGNOSIS — Z969 Presence of functional implant, unspecified: Secondary | ICD-10-CM

## 2018-05-05 MED ORDER — CEPHALEXIN 500 MG PO CAPS: 500.0000 mg | ORAL_CAPSULE | Freq: Two times a day (BID) | ORAL | 0 refills | Status: DC

## 2018-05-05 NOTE — Progress Notes (Signed)
Patient Name: Maria Navarro DOB: 10/14/1940  MRN: [id number]   Date of Service: 05/05/18   Surgeon: Dr. Ventura Sellers, DPM Assistants: None Pre-operative Diagnosis: Retained hardware Post-operative Diagnosis: same Procedures:             1) Removal of hardware Pathology/Specimens: * Cannot find log * Anesthesia: Local with 3 cc 50/50% lidocaine 1% plain, marcaine 0.5% plain Hemostasis: Anatomic Estimated Blood Loss: 3 ml Materials: None Medications: None Complications: Unable to be removed  Indications for Procedure:  This is a 78 y.o. female with a retained screw to the right second toe. Discussed removal but patient wanted to avoid having surgery again. Discussed trial of in-office removal.   Procedure in Detail: Patient was identified in pre-operative holding area. Formal consent was signed and the right lower extremity was marked. Patient was brought back to the operating room and placed on the operating room table in the supine position. Anesthesia was induced.   The extremity was prepped in the usual fashion. Procedure timeout was performed. Attention was then directed to the right second toe where a linear incision was made at the tip of the toe.  Dissection was carried down to the bone. The appropriate driver was introduced but unable to properly seated. The screw was unable to be backed out. It is likely that there was osseous ingrowth into the canulated screw. The incision was irrigated and closed with 4-0 monocryl. The foot was then dressed with 4x4, kerlix, and ACE bandage. Patient tolerated the procedure well.   Disposition: Will plan for operative removal at the surgical center with better equipment available and use of fluoroscopy. Consent reviewed. Will plan for procedure Wednesday.

## 2018-05-05 NOTE — Patient Instructions (Signed)
Pre-Operative Instructions  Congratulations, you have decided to take an important step towards improving your quality of life.  You can be assured that the doctors and staff at [facility] will be with you every step of the way.  Here are some important things you should know:  1. Plan to be at the surgery center/hospital at least 1 (one) hour prior to your scheduled time, unless otherwise directed by the surgical center/hospital staff.  You must have a responsible adult accompany you, remain during the surgery and drive you home.  Make sure you have directions to the surgical center/hospital to ensure you arrive on time. 2. If you are having surgery at [facility] or [facility] hospitals, you will need a copy of your medical history and physical form from your family physician within one month prior to the date of surgery. We will give you a form for your primary physician to complete.  3. We make every effort to accommodate the date you request for surgery.  However, there are times where surgery dates or times have to be moved.  We will contact you as soon as possible if a change in schedule is required.   4. No aspirin/ibuprofen for one week before surgery.  If you are on aspirin, any non-steroidal anti-inflammatory medications (Mobic, Aleve, Ibuprofen) should not be taken seven (7) days prior to your surgery.  You make take Tylenol for pain prior to surgery.  5. Medications - If you are taking daily heart and blood pressure medications, seizure, reflux, allergy, asthma, anxiety, pain or diabetes medications, make sure you notify the surgery center/hospital before the day of surgery so they can tell you which medications you should take or avoid the day of surgery. 6. No food or drink after midnight the night before surgery unless directed otherwise by surgical center/hospital staff. 7. No alcoholic beverages 24-hours prior to surgery.  No smoking 24-hours prior or 24-hours after  surgery. 8. Wear loose pants or shorts. They should be loose enough to fit over bandages, boots, and casts. 9. Don't wear slip-on shoes. Sneakers are preferred. 10. Bring your boot with you to the surgery center/hospital.  Also bring crutches or a walker if your physician has prescribed it for you.  If you do not have this equipment, it will be provided for you after surgery. 11. If you have not been contacted by the surgery center/hospital by the day before your surgery, call to confirm the date and time of your surgery. 12. Leave-time from work may vary depending on the type of surgery you have.  Appropriate arrangements should be made prior to surgery with your employer. 13. Prescriptions will be provided immediately following surgery by your doctor.  Fill these as soon as possible after surgery and take the medication as directed. Pain medications will not be refilled on weekends and must be approved by the doctor. 14. Remove nail polish on the operative foot and avoid getting pedicures prior to surgery. 15. Wash the night before surgery.  The night before surgery wash the foot and leg well with water and the antibacterial soap provided. Be sure to pay special attention to beneath the toenails and in between the toes.  Wash for at least three (3) minutes. Rinse thoroughly with water and dry well with a towel.  Perform this wash unless told not to do so by your physician.  Enclosed: 1 Ice pack (please put in freezer the night before surgery)   1 Hibiclens skin cleaner     Pre-op instructions  If you have any questions regarding the instructions, please do not hesitate to call our office.  [place]: [address], [place], [place] [address] -- [phone]  [place]: [address] [place], [place] [address] -- [phone]  [place]: [address]  , [place] [address] -- [phone]  [place]: [address], [address], [place], [place] [address] -- [phone]  Website:  [email or web address] 

## 2018-05-07 DIAGNOSIS — K219 Gastro-esophageal reflux disease without esophagitis: Secondary | ICD-10-CM | POA: Diagnosis not present

## 2018-05-07 DIAGNOSIS — T8484XA Pain due to internal orthopedic prosthetic devices, implants and grafts, initial encounter: Secondary | ICD-10-CM | POA: Diagnosis not present

## 2018-05-07 DIAGNOSIS — Z472 Encounter for removal of internal fixation device: Secondary | ICD-10-CM | POA: Diagnosis not present

## 2018-05-07 DIAGNOSIS — Z4889 Encounter for other specified surgical aftercare: Secondary | ICD-10-CM | POA: Diagnosis not present

## 2018-05-12 ENCOUNTER — Ambulatory Visit (INDEPENDENT_AMBULATORY_CARE_PROVIDER_SITE_OTHER): Payer: Medicare Other | Admitting: Podiatry

## 2018-05-12 ENCOUNTER — Encounter: Payer: Self-pay | Admitting: Podiatry

## 2018-05-12 ENCOUNTER — Ambulatory Visit (INDEPENDENT_AMBULATORY_CARE_PROVIDER_SITE_OTHER): Payer: Medicare Other

## 2018-05-12 VITALS — BP 145/73 | HR 89 | Temp 98.0°F | Resp 16

## 2018-05-12 DIAGNOSIS — Z9889 Other specified postprocedural states: Secondary | ICD-10-CM

## 2018-05-12 DIAGNOSIS — M2021 Hallux rigidus, right foot: Secondary | ICD-10-CM | POA: Diagnosis not present

## 2018-05-12 DIAGNOSIS — M2011 Hallux valgus (acquired), right foot: Secondary | ICD-10-CM

## 2018-05-12 DIAGNOSIS — M2041 Other hammer toe(s) (acquired), right foot: Secondary | ICD-10-CM

## 2018-05-12 DIAGNOSIS — Z969 Presence of functional implant, unspecified: Secondary | ICD-10-CM | POA: Diagnosis not present

## 2018-05-12 NOTE — Progress Notes (Signed)
  Subjective:  Patient ID: Maria Navarro, female    DOB: 1940/05/24,  MRN: [id number]  Chief Complaint  Patient presents with  . Routine Post Op    Pt.s tates," It's still a little sore; 3/10 soreness  but it's doing fine." Tx: post op shoe and tylenol --pt deneis N/V/?FCH    DOS: 05/07/2018 Procedure: Removal of hardware right 2nd toe  78 y.o. female returns for post-op check. History as above doing well only a little soreness noted.  Review of Systems: Negative except as noted in the HPI. Denies N/V/F/Ch.  No past medical history on file.  Current Outpatient Medications:  .  aspirin 81 MG chewable tablet, Chew by mouth daily., Disp: , Rfl:  .  buPROPion (WELLBUTRIN XL) 300 MG 24 hr tablet, Take 300 mg by mouth daily., Disp: , Rfl:  .  cephALEXin (KEFLEX) 500 MG capsule, Take 1 capsule (500 mg total) by mouth 2 (two) times daily., Disp: 14 capsule, Rfl: 0 .  FLUAD 0.5 ML SUSY, , Disp: , Rfl:  .  HYDROcodone-acetaminophen (NORCO) 5-325 MG tablet, Take 1 tablet by mouth every 6 (six) hours as needed for moderate pain., Disp: 30 tablet, Rfl: 0 .  ondansetron (ZOFRAN) 4 MG tablet, Take 1 tablet (4 mg total) by mouth every 8 (eight) hours as needed for nausea or vomiting., Disp: 20 tablet, Rfl: 0 .  traMADol (ULTRAM) 50 MG tablet, Take by mouth every 6 (six) hours as needed., Disp: , Rfl:   Social History   Tobacco Use  Smoking Status Never Smoker  Smokeless Tobacco Never Used    Not on File Objective:   Vitals:   05/12/18 0911  BP: (!) 145/73  Pulse: 89  Resp: 16  Temp: 98 F (36.7 C)   There is no height or weight on file to calculate BMI. Constitutional Well developed. Well nourished.  Vascular Foot warm and well perfused. Capillary refill normal to all digits.   Neurologic Normal speech. Oriented to person, place, and time. Epicritic sensation to light touch grossly present bilaterally.  Dermatologic Skin healing well without signs of infection. Skin edges well  coapted without signs of infection.  Orthopedic: Tenderness to palpation noted about the surgical site.   Radiographs: Taken and reviewed sucessful removal of the screw noted. Assessment:   1. Retained orthopedic hardware   2. Status post right foot surgery   3. Acquired hallux valgus of right foot   4. Hammer toe of right foot   5. Hallux rigidus of right foot    Plan:  Patient was evaluated and treated and all questions answered.  S/p foot surgery right -Progressing as expected post-operatively. -XR: As above -WB Status: WBAT in surgical shoe -Sutures: intact. Ok to shower. Apply abx ointment and band-aid daily.. -Medications: none refilled -Foot redressed.  No follow-ups on file.

## 2018-05-19 ENCOUNTER — Ambulatory Visit (INDEPENDENT_AMBULATORY_CARE_PROVIDER_SITE_OTHER): Payer: Medicare Other | Admitting: Podiatry

## 2018-05-19 ENCOUNTER — Encounter: Payer: Self-pay | Admitting: Podiatry

## 2018-05-19 VITALS — BP 128/105 | HR 84 | Temp 98.0°F | Resp 16

## 2018-05-19 DIAGNOSIS — Z969 Presence of functional implant, unspecified: Secondary | ICD-10-CM

## 2018-05-19 NOTE — Progress Notes (Signed)
  Subjective:  Patient ID: Maria Navarro, female    DOB: [birth year],  MRN: [id number]  Chief Complaint  Patient presents with  . Routine Post Op    POV # 2 PT. states," it's doing good, still when I step down it wants to hurt; 2/10 pain." tx: neosporin and bandaid -pt deneis N/V//F/HC    DOS: 05/07/2018 Procedure: Removal of hardware right 2nd toe  78 y.o. female returns for post-op check. Doing well states the toe is feeling better than it was prior to surgery.  Review of Systems: Negative except as noted in the HPI. Denies N/V/F/Ch.  No past medical history on file.  Current Outpatient Medications:  .  aspirin 81 MG chewable tablet, Chew by mouth daily., Disp: , Rfl:  .  buPROPion (WELLBUTRIN XL) 300 MG 24 hr tablet, Take 300 mg by mouth daily., Disp: , Rfl:  .  cephALEXin (KEFLEX) 500 MG capsule, Take 1 capsule (500 mg total) by mouth 2 (two) times daily., Disp: 14 capsule, Rfl: 0 .  FLUAD 0.5 ML SUSY, , Disp: , Rfl:  .  HYDROcodone-acetaminophen (NORCO) 5-325 MG tablet, Take 1 tablet by mouth every 6 (six) hours as needed for moderate pain., Disp: 30 tablet, Rfl: 0 .  ondansetron (ZOFRAN) 4 MG tablet, Take 1 tablet (4 mg total) by mouth every 8 (eight) hours as needed for nausea or vomiting., Disp: 20 tablet, Rfl: 0 .  traMADol (ULTRAM) 50 MG tablet, Take by mouth every 6 (six) hours as needed., Disp: , Rfl:   Social History   Tobacco Use  Smoking Status Never Smoker  Smokeless Tobacco Never Used    Not on File Objective:   Vitals:   05/19/18 0955  BP: (!) 128/105  Pulse: 84  Resp: 16  Temp: 98 F (36.7 C)   There is no height or weight on file to calculate BMI. Constitutional Well developed. Well nourished.  Vascular Foot warm and well perfused. Capillary refill normal to all digits.   Neurologic Normal speech. Oriented to person, place, and time. Epicritic sensation to light touch grossly present bilaterally.  Dermatologic Skin healing well without signs of  infection. Skin edges well coapted without signs of infection.  Orthopedic: Tenderness to palpation noted about the surgical site.   Radiographs: Taken and reviewed sucessful removal of the screw noted. Assessment:   1. Retained orthopedic hardware    Plan:  Patient was evaluated and treated and all questions answered.  S/p foot surgery right -Progressing as expected post-operatively. -XR: As above -WB Status: WBAT in normal shoe -Sutures: intact. Ok to shower. Apply abx ointment and band-aid daily.. -Medications: none refilled -Foot redressed.  Return in about 1 week (around 05/26/2018) for Suture removal..

## 2018-05-26 ENCOUNTER — Encounter: Payer: Self-pay | Admitting: Podiatry

## 2018-05-26 ENCOUNTER — Ambulatory Visit (INDEPENDENT_AMBULATORY_CARE_PROVIDER_SITE_OTHER): Payer: Medicare Other | Admitting: Podiatry

## 2018-05-26 VITALS — BP 130/71 | HR 80 | Temp 97.8°F | Resp 16

## 2018-05-26 DIAGNOSIS — Z9889 Other specified postprocedural states: Secondary | ICD-10-CM

## 2018-05-26 NOTE — Progress Notes (Signed)
  Subjective:  Patient ID: Maria Navarro, female    DOB: 09-25-1940,  MRN: [id number]  Chief Complaint  Patient presents with  . Routine Post Op    POV #3 PT.s tates," toe have been very tender (3/10) mostly when I twist my foot certain ways." Tx: abx and bandaid -pt denies N/V/F/CH    DOS: 05/07/2018 Procedure: Removal of hardware right 2nd toe  78 y.o. female returns for post-op check. Has some tenderness in the toe when she twists the toe certain ways otherwise doing ok.  Review of Systems: Negative except as noted in the HPI. Denies N/V/F/Ch.  No past medical history on file.  Current Outpatient Medications:  .  aspirin 81 MG chewable tablet, Chew by mouth daily., Disp: , Rfl:  .  buPROPion (WELLBUTRIN XL) 300 MG 24 hr tablet, Take 300 mg by mouth daily., Disp: , Rfl:  .  cephALEXin (KEFLEX) 500 MG capsule, Take 1 capsule (500 mg total) by mouth 2 (two) times daily., Disp: 14 capsule, Rfl: 0 .  FLUAD 0.5 ML SUSY, , Disp: , Rfl:  .  HYDROcodone-acetaminophen (NORCO) 5-325 MG tablet, Take 1 tablet by mouth every 6 (six) hours as needed for moderate pain., Disp: 30 tablet, Rfl: 0 .  ondansetron (ZOFRAN) 4 MG tablet, Take 1 tablet (4 mg total) by mouth every 8 (eight) hours as needed for nausea or vomiting., Disp: 20 tablet, Rfl: 0 .  traMADol (ULTRAM) 50 MG tablet, Take by mouth every 6 (six) hours as needed., Disp: , Rfl:   Social History   Tobacco Use  Smoking Status Never Smoker  Smokeless Tobacco Never Used    Not on File Objective:   Vitals:   05/26/18 1325  BP: 130/71  Pulse: 80  Resp: 16  Temp: 97.8 F (36.6 C)   There is no height or weight on file to calculate BMI. Constitutional Well developed. Well nourished.  Vascular Foot warm and well perfused. Capillary refill normal to all digits.   Neurologic Normal speech. Oriented to person, place, and time. Epicritic sensation to light touch grossly present bilaterally.  Dermatologic Skin healing well without  signs of infection. Skin edges well coapted without signs of infection.  Orthopedic: Slight tenderness to palpation noted about the surgical site.   Radiographs: Taken and reviewed sucessful removal of the screw noted. Assessment:   No diagnosis found. Plan:  Patient was evaluated and treated and all questions answered.  S/p foot surgery right -Progressing as expected post-operatively. -XR: As above -WB Status: WBAT in normal shoe -Sutures: out. Steri strips applied. -Medications: none refilled -Foot redressed.  Return in about 4 weeks (around 06/23/2018) for XRs, Post-op.

## 2018-06-23 ENCOUNTER — Other Ambulatory Visit: Payer: Self-pay

## 2018-06-23 ENCOUNTER — Ambulatory Visit (INDEPENDENT_AMBULATORY_CARE_PROVIDER_SITE_OTHER): Payer: Medicare Other | Admitting: Podiatry

## 2018-06-23 ENCOUNTER — Ambulatory Visit (INDEPENDENT_AMBULATORY_CARE_PROVIDER_SITE_OTHER): Payer: Medicare Other

## 2018-06-23 ENCOUNTER — Encounter: Payer: Self-pay | Admitting: Podiatry

## 2018-06-23 VITALS — BP 143/73 | HR 74 | Temp 98.0°F | Resp 16

## 2018-06-23 DIAGNOSIS — M2011 Hallux valgus (acquired), right foot: Secondary | ICD-10-CM | POA: Diagnosis not present

## 2018-06-23 DIAGNOSIS — Z9889 Other specified postprocedural states: Secondary | ICD-10-CM

## 2018-06-23 NOTE — Progress Notes (Signed)
  Subjective:  Patient ID: Maria Navarro, female    DOB: 05-24-1940,  MRN: [id number]  Chief Complaint  Patient presents with  . Routine Post Op    POV PT. states," doing fine." Tx: none -pt denies N/V/F/Ch -pt deneis any issues    DOS: 05/07/2018 Procedure: Removal of hardware right 2nd toe  78 y.o. female returns for post-op check. Denies pain or issues. Ambulating in normal shoes without problems.  Review of Systems: Negative except as noted in the HPI. Denies N/V/F/Ch.  No past medical history on file.  Current Outpatient Medications:  .  aspirin 81 MG chewable tablet, Chew by mouth daily., Disp: , Rfl:  .  buPROPion (WELLBUTRIN XL) 300 MG 24 hr tablet, Take 300 mg by mouth daily., Disp: , Rfl:  .  cephALEXin (KEFLEX) 500 MG capsule, Take 1 capsule (500 mg total) by mouth 2 (two) times daily., Disp: 14 capsule, Rfl: 0 .  FLUAD 0.5 ML SUSY, , Disp: , Rfl:  .  HYDROcodone-acetaminophen (NORCO) 5-325 MG tablet, Take 1 tablet by mouth every 6 (six) hours as needed for moderate pain., Disp: 30 tablet, Rfl: 0 .  ondansetron (ZOFRAN) 4 MG tablet, Take 1 tablet (4 mg total) by mouth every 8 (eight) hours as needed for nausea or vomiting., Disp: 20 tablet, Rfl: 0 .  pantoprazole (PROTONIX) 40 MG tablet, , Disp: , Rfl:  .  traMADol (ULTRAM) 50 MG tablet, Take by mouth every 6 (six) hours as needed., Disp: , Rfl:   Social History   Tobacco Use  Smoking Status Never Smoker  Smokeless Tobacco Never Used    Not on File Objective:   Vitals:   06/23/18 1055  BP: (!) 143/73  Pulse: 74  Resp: 16  Temp: 98 F (36.7 C)   There is no height or weight on file to calculate BMI. Constitutional Well developed. Well nourished.  Vascular Foot warm and well perfused. Capillary refill normal to all digits.   Neurologic Normal speech. Oriented to person, place, and time. Epicritic sensation to light touch grossly present bilaterally.  Dermatologic Skin healing well without signs of  infection. Skin edges well coapted without signs of infection.  Orthopedic: Slight tenderness to palpation noted about the surgical site.   Radiographs: Taken and reviewed slight bridging across 2nd PIPJ. Assessment:   1. Post-operative state    Plan:  Patient was evaluated and treated and all questions answered.  S/p foot surgery right -Progressing as expected post-operatively. -XR: As above -WB Status: WBAT in normal shoe -F/u PRN  No follow-ups on file.

## 2018-06-26 ENCOUNTER — Other Ambulatory Visit: Payer: Self-pay | Admitting: Podiatry

## 2018-06-26 DIAGNOSIS — Z969 Presence of functional implant, unspecified: Secondary | ICD-10-CM

## 2018-06-26 DIAGNOSIS — Z9889 Other specified postprocedural states: Secondary | ICD-10-CM

## 2018-08-29 DIAGNOSIS — N201 Calculus of ureter: Secondary | ICD-10-CM | POA: Diagnosis not present

## 2018-08-29 DIAGNOSIS — N132 Hydronephrosis with renal and ureteral calculous obstruction: Secondary | ICD-10-CM | POA: Diagnosis not present

## 2018-11-05 DIAGNOSIS — Z0000 Encounter for general adult medical examination without abnormal findings: Secondary | ICD-10-CM | POA: Diagnosis not present

## 2018-11-05 DIAGNOSIS — I10 Essential (primary) hypertension: Secondary | ICD-10-CM | POA: Diagnosis not present

## 2018-11-05 DIAGNOSIS — K219 Gastro-esophageal reflux disease without esophagitis: Secondary | ICD-10-CM | POA: Diagnosis not present

## 2018-11-05 DIAGNOSIS — F3340 Major depressive disorder, recurrent, in remission, unspecified: Secondary | ICD-10-CM | POA: Diagnosis not present

## 2018-11-05 DIAGNOSIS — Z131 Encounter for screening for diabetes mellitus: Secondary | ICD-10-CM | POA: Diagnosis not present

## 2018-11-05 DIAGNOSIS — E782 Mixed hyperlipidemia: Secondary | ICD-10-CM | POA: Diagnosis not present

## 2019-02-03 DIAGNOSIS — Z23 Encounter for immunization: Secondary | ICD-10-CM | POA: Diagnosis not present

## 2019-05-14 DIAGNOSIS — F3340 Major depressive disorder, recurrent, in remission, unspecified: Secondary | ICD-10-CM | POA: Diagnosis not present

## 2019-05-14 DIAGNOSIS — G894 Chronic pain syndrome: Secondary | ICD-10-CM | POA: Diagnosis not present

## 2019-05-14 DIAGNOSIS — I10 Essential (primary) hypertension: Secondary | ICD-10-CM | POA: Diagnosis not present

## 2019-05-14 DIAGNOSIS — E782 Mixed hyperlipidemia: Secondary | ICD-10-CM | POA: Diagnosis not present

## 2019-05-14 DIAGNOSIS — I358 Other nonrheumatic aortic valve disorders: Secondary | ICD-10-CM

## 2019-05-14 DIAGNOSIS — Z131 Encounter for screening for diabetes mellitus: Secondary | ICD-10-CM | POA: Diagnosis not present

## 2019-05-14 DIAGNOSIS — K219 Gastro-esophageal reflux disease without esophagitis: Secondary | ICD-10-CM | POA: Diagnosis not present

## 2019-05-14 HISTORY — DX: Other nonrheumatic aortic valve disorders: I35.8

## 2019-11-19 DIAGNOSIS — K222 Esophageal obstruction: Secondary | ICD-10-CM | POA: Diagnosis not present

## 2019-11-19 DIAGNOSIS — R1310 Dysphagia, unspecified: Secondary | ICD-10-CM | POA: Diagnosis not present

## 2019-11-19 DIAGNOSIS — K644 Residual hemorrhoidal skin tags: Secondary | ICD-10-CM | POA: Diagnosis not present

## 2019-11-23 DIAGNOSIS — I10 Essential (primary) hypertension: Secondary | ICD-10-CM | POA: Diagnosis not present

## 2019-11-23 DIAGNOSIS — Z79899 Other long term (current) drug therapy: Secondary | ICD-10-CM | POA: Diagnosis not present

## 2019-11-23 DIAGNOSIS — Z131 Encounter for screening for diabetes mellitus: Secondary | ICD-10-CM | POA: Diagnosis not present

## 2019-11-23 DIAGNOSIS — G894 Chronic pain syndrome: Secondary | ICD-10-CM | POA: Diagnosis not present

## 2019-11-23 DIAGNOSIS — E782 Mixed hyperlipidemia: Secondary | ICD-10-CM | POA: Diagnosis not present

## 2019-11-23 DIAGNOSIS — F3340 Major depressive disorder, recurrent, in remission, unspecified: Secondary | ICD-10-CM | POA: Diagnosis not present

## 2019-11-23 DIAGNOSIS — M810 Age-related osteoporosis without current pathological fracture: Secondary | ICD-10-CM | POA: Diagnosis not present

## 2019-12-04 DIAGNOSIS — Z1159 Encounter for screening for other viral diseases: Secondary | ICD-10-CM | POA: Diagnosis not present

## 2019-12-11 DIAGNOSIS — K529 Noninfective gastroenteritis and colitis, unspecified: Secondary | ICD-10-CM | POA: Diagnosis not present

## 2019-12-11 DIAGNOSIS — Z8711 Personal history of peptic ulcer disease: Secondary | ICD-10-CM | POA: Diagnosis not present

## 2019-12-11 DIAGNOSIS — Z800 Family history of malignant neoplasm of digestive organs: Secondary | ICD-10-CM | POA: Diagnosis not present

## 2019-12-11 DIAGNOSIS — K222 Esophageal obstruction: Secondary | ICD-10-CM | POA: Diagnosis not present

## 2019-12-11 DIAGNOSIS — Z8719 Personal history of other diseases of the digestive system: Secondary | ICD-10-CM | POA: Diagnosis not present

## 2019-12-11 DIAGNOSIS — K55049 Acute infarction of large intestine, extent unspecified: Secondary | ICD-10-CM | POA: Diagnosis not present

## 2019-12-11 DIAGNOSIS — R1310 Dysphagia, unspecified: Secondary | ICD-10-CM | POA: Diagnosis not present

## 2019-12-11 DIAGNOSIS — K644 Residual hemorrhoidal skin tags: Secondary | ICD-10-CM | POA: Diagnosis not present

## 2019-12-11 DIAGNOSIS — K633 Ulcer of intestine: Secondary | ICD-10-CM | POA: Diagnosis not present

## 2019-12-11 DIAGNOSIS — Z931 Gastrostomy status: Secondary | ICD-10-CM | POA: Diagnosis not present

## 2019-12-11 DIAGNOSIS — Z87442 Personal history of urinary calculi: Secondary | ICD-10-CM | POA: Diagnosis not present

## 2019-12-11 DIAGNOSIS — F329 Major depressive disorder, single episode, unspecified: Secondary | ICD-10-CM | POA: Diagnosis not present

## 2019-12-11 DIAGNOSIS — K219 Gastro-esophageal reflux disease without esophagitis: Secondary | ICD-10-CM | POA: Diagnosis not present

## 2019-12-11 DIAGNOSIS — K449 Diaphragmatic hernia without obstruction or gangrene: Secondary | ICD-10-CM | POA: Diagnosis not present

## 2019-12-11 DIAGNOSIS — K5900 Constipation, unspecified: Secondary | ICD-10-CM | POA: Diagnosis not present

## 2019-12-14 DIAGNOSIS — N959 Unspecified menopausal and perimenopausal disorder: Secondary | ICD-10-CM | POA: Diagnosis not present

## 2019-12-14 DIAGNOSIS — Z780 Asymptomatic menopausal state: Secondary | ICD-10-CM | POA: Diagnosis not present

## 2020-01-18 DIAGNOSIS — U071 COVID-19: Secondary | ICD-10-CM | POA: Diagnosis not present

## 2020-01-18 DIAGNOSIS — Z20822 Contact with and (suspected) exposure to covid-19: Secondary | ICD-10-CM | POA: Diagnosis not present

## 2020-01-21 ENCOUNTER — Other Ambulatory Visit: Payer: Self-pay | Admitting: Oncology

## 2020-01-21 DIAGNOSIS — U071 COVID-19: Secondary | ICD-10-CM

## 2020-01-21 NOTE — Progress Notes (Signed)
I connected by phone with  Maria Navarro to discuss the potential use of an new treatment for mild to moderate COVID-19 viral infection in non-hospitalized patients.   This patient is a age/sex that meets the FDA criteria for Emergency Use Authorization of casirivimab\imdevimab.  Has a (+) direct SARS-CoV-2 viral test result 1. Has mild or moderate COVID-19  2. Is ? 78 years of age and weighs ? 40 kg 3. Is NOT hospitalized due to COVID-19 4. Is NOT requiring oxygen therapy or requiring an increase in baseline oxygen flow rate due to COVID-19 5. Is within 10 days of symptom onset 6. Has at least one of the high risk factor(s) for progression to severe COVID-19 and/or hospitalization as defined in EUA. ? Specific high risk criteria :No past medical history on file. ? HIGH RISK- AGE   Symptom onset  01/17/2020   I have spoken and communicated the following to the patient or parent/caregiver:   1. FDA has authorized the emergency use of casirivimab\imdevimab for the treatment of mild to moderate COVID-19 in adults and pediatric patients with positive results of direct SARS-CoV-2 viral testing who are 70 years of age and older weighing at least 40 kg, and who are at high risk for progressing to severe COVID-19 and/or hospitalization.   2. The significant known and potential risks and benefits of casirivimab\imdevimab, and the extent to which such potential risks and benefits are unknown.   3. Information on available alternative treatments and the risks and benefits of those alternatives, including clinical trials.   4. Patients treated with casirivimab\imdevimab should continue to self-isolate and use infection control measures (e.g., wear mask, isolate, social distance, avoid sharing personal items, clean and disinfect "high touch" surfaces, and frequent handwashing) according to CDC guidelines.    5. The patient or parent/caregiver has the option to accept or refuse casirivimab\imdevimab .    After reviewing this information with the patient, The patient agreed to proceed with receiving casirivimab\imdevimab infusion and will be provided a copy of the Fact sheet prior to receiving the infusion.Mignon Pine, AGNP-C [phone] (Infusion Center Hotline)

## 2020-01-22 ENCOUNTER — Ambulatory Visit (HOSPITAL_COMMUNITY): Payer: Self-pay

## 2020-01-22 DIAGNOSIS — U071 COVID-19: Secondary | ICD-10-CM | POA: Diagnosis not present

## 2020-03-28 DIAGNOSIS — T1512XA Foreign body in conjunctival sac, left eye, initial encounter: Secondary | ICD-10-CM | POA: Diagnosis not present

## 2020-03-28 DIAGNOSIS — S0502XA Injury of conjunctiva and corneal abrasion without foreign body, left eye, initial encounter: Secondary | ICD-10-CM | POA: Diagnosis not present

## 2020-05-30 DIAGNOSIS — M4722 Other spondylosis with radiculopathy, cervical region: Secondary | ICD-10-CM | POA: Insufficient documentation

## 2020-05-30 DIAGNOSIS — M545 Low back pain, unspecified: Secondary | ICD-10-CM

## 2020-05-30 DIAGNOSIS — G8929 Other chronic pain: Secondary | ICD-10-CM | POA: Insufficient documentation

## 2020-05-30 DIAGNOSIS — M519 Unspecified thoracic, thoracolumbar and lumbosacral intervertebral disc disorder: Secondary | ICD-10-CM

## 2020-05-30 HISTORY — DX: Low back pain, unspecified: M54.50

## 2020-05-30 HISTORY — DX: Unspecified thoracic, thoracolumbar and lumbosacral intervertebral disc disorder: M51.9

## 2020-05-30 HISTORY — DX: Other chronic pain: G89.29

## 2020-06-01 DIAGNOSIS — I361 Nonrheumatic tricuspid (valve) insufficiency: Secondary | ICD-10-CM | POA: Diagnosis not present

## 2020-10-26 DIAGNOSIS — I361 Nonrheumatic tricuspid (valve) insufficiency: Secondary | ICD-10-CM | POA: Diagnosis not present

## 2020-10-26 DIAGNOSIS — I340 Nonrheumatic mitral (valve) insufficiency: Secondary | ICD-10-CM | POA: Diagnosis not present

## 2020-10-26 DIAGNOSIS — I4891 Unspecified atrial fibrillation: Secondary | ICD-10-CM

## 2020-10-26 HISTORY — DX: Unspecified atrial fibrillation: I48.91

## 2020-10-27 DIAGNOSIS — I10 Essential (primary) hypertension: Secondary | ICD-10-CM | POA: Diagnosis not present

## 2020-10-27 DIAGNOSIS — I350 Nonrheumatic aortic (valve) stenosis: Secondary | ICD-10-CM | POA: Diagnosis not present

## 2020-10-27 DIAGNOSIS — I4891 Unspecified atrial fibrillation: Secondary | ICD-10-CM | POA: Diagnosis not present

## 2020-10-28 DIAGNOSIS — I4891 Unspecified atrial fibrillation: Secondary | ICD-10-CM | POA: Diagnosis not present

## 2020-10-28 DIAGNOSIS — I350 Nonrheumatic aortic (valve) stenosis: Secondary | ICD-10-CM | POA: Diagnosis not present

## 2020-10-28 DIAGNOSIS — I10 Essential (primary) hypertension: Secondary | ICD-10-CM | POA: Diagnosis not present

## 2020-11-01 ENCOUNTER — Telehealth: Payer: Self-pay | Admitting: Cardiology

## 2020-11-01 NOTE — Telephone Encounter (Signed)
New Message:     Pt needs a [facility] appt with Dr Bing Matter.He does not have any appointments until September at either office. Please advise.

## 2020-11-14 ENCOUNTER — Other Ambulatory Visit: Payer: Self-pay

## 2020-11-15 ENCOUNTER — Ambulatory Visit (INDEPENDENT_AMBULATORY_CARE_PROVIDER_SITE_OTHER): Payer: Medicare Other | Admitting: Cardiology

## 2020-11-15 ENCOUNTER — Encounter: Payer: Self-pay | Admitting: Cardiology

## 2020-11-15 ENCOUNTER — Other Ambulatory Visit: Payer: Self-pay

## 2020-11-15 VITALS — BP 112/62 | HR 109 | Ht 67.0 in | Wt 138.2 lb

## 2020-11-15 DIAGNOSIS — I358 Other nonrheumatic aortic valve disorders: Secondary | ICD-10-CM

## 2020-11-15 DIAGNOSIS — D649 Anemia, unspecified: Secondary | ICD-10-CM

## 2020-11-15 DIAGNOSIS — I10 Essential (primary) hypertension: Secondary | ICD-10-CM | POA: Diagnosis not present

## 2020-11-15 DIAGNOSIS — I4891 Unspecified atrial fibrillation: Secondary | ICD-10-CM

## 2020-11-15 HISTORY — DX: Anemia, unspecified: D64.9

## 2020-11-15 NOTE — Progress Notes (Signed)
Cardiology Office Note:    Date:  11/15/2020   ID:  [name], DOB [birth year], MRN [id number]  PCP:  Olive Bass, MD  Cardiologist:  Gypsy Balsam, MD    Referring MD: Olive Bass, MD   Chief Complaint  Patient presents with   rapid HR   Palpitations    History of Present Illness:    Maria Navarro is a 80 y.o. female with past medical history significant for aortic stenosis, GERD, osteoarthritis, recently she was admitted to [facility] for elective neck surgery however she was noted to be in atrial fibrillation with fast ventricular rate.  Surgery has been consulted with being asked to see her in evaluation.  She was noted to be in new onset of atrial fibrillation with poorly controlled ventricular rate.  Echocardiogram was done at that time which showed only very mild stenosis however valve appears to be quite significantly calcified.  There was no critical stenosis, she is being put on anticoagulation her AV node has been controlled with calcium channel blocker and beta-blocker.  She is coming today to our office to talk about options.  Overall she is doing well still described to have some palpitations no dizziness no passing out no chest pain.  Past Medical History:  Diagnosis Date   Allergic rhinitis 08/10/2015   Aortic valve sclerosis 05/14/2019   Formatting of this note might be different from the original. 06/02/2020: ECHO, mild   Atrial fibrillation (HCC) 10/26/2020   Formatting of this note might be different from the original. 10/26/2020: noted pre-op by anesthesia, rate 118, to ED   Benign hypertension 08/18/2015   Chronic bilateral low back pain without sciatica 05/30/2020   Family history of premature coronary artery disease 04/14/2018   GERD (gastroesophageal reflux disease) 08/10/2015   Lumbar disc disease 05/30/2020   Mixed hyperlipidemia 10/03/2015   2018: declined rx 2019: declinded rx   Osteoarthritis 08/10/2015   Osteoporosis 10/03/2015   2008: -1.5  2017: -2.5 hip  Formatting of this note might be different from the original. 2008: -1.5 2017: -2.5 hip 2021: -2.6   Pain syndrome, chronic 08/10/2015   (2004) DX/RX: NSAID (2006) DX: NSAID Gastric ulcer with esoph stricture  (2006) RX: tramadol   Plantar fasciitis 08/18/2016   Recurrent major depressive disorder, in remission (HCC) 08/10/2015   Screening for diabetes mellitus (DM) 10/03/2015    History reviewed. No pertinent surgical history.  Current Medications: Current Meds  Medication Sig   BOSWELLIA-GLUCOSAMINE-VIT D PO Take 1 tablet by mouth daily. Unknown strength   buPROPion (WELLBUTRIN XL) 300 MG 24 hr tablet Take 300 mg by mouth daily.   cefdinir (OMNICEF) 300 MG capsule Take 300 mg by mouth 2 (two) times daily.   diltiazem (CARDIZEM CD) 180 MG 24 hr capsule Take 180 mg by mouth daily.   DULoxetine (CYMBALTA) 30 MG capsule Take 30 mg by mouth 2 (two) times daily.   ELIQUIS 5 MG TABS tablet Take 5 mg by mouth 2 (two) times daily.   fexofenadine (ALLEGRA) 180 MG tablet Take 180 mg by mouth daily.   iron polysaccharides (NIFEREX) 150 MG capsule Take 150 mg by mouth daily.   metoprolol tartrate (LOPRESSOR) 50 MG tablet Take 50 mg by mouth 2 (two) times daily.   pantoprazole (PROTONIX) 40 MG tablet Take 40 mg by mouth 2 (two) times daily.   PERCOCET 5-325 MG tablet Take 1 tablet by mouth every 6 (six) hours as needed for pain.   traMADol Janean Sark)  50 MG tablet Take 50 mg by mouth every 6 (six) hours as needed for moderate pain or severe pain.   Turmeric (QC TUMERIC COMPLEX PO) Take 1 tablet by mouth daily. Unknown strenght     Allergies:   Escitalopram   Social History   Socioeconomic History   Marital status: Widowed    Spouse name: Not on file   Number of children: Not on file   Years of education: Not on file   Highest education level: Not on file  Occupational History   Not on file  Tobacco Use   Smoking status: Never   Smokeless tobacco: Never  Substance and Sexual  Activity   Alcohol use: Not on file   Drug use: Not on file   Sexual activity: Not on file  Other Topics Concern   Not on file  Social History Narrative   Not on file   Social Determinants of Health   Financial Resource Strain: Not on file  Food Insecurity: Not on file  Transportation Needs: Not on file  Physical Activity: Not on file  Stress: Not on file  Social Connections: Not on file     Family History: The patient's family history is not on file. ROS:   Please see the history of present illness.    All 14 point review of systems negative except as described per history of present illness  EKGs/Labs/Other Studies Reviewed:    Echocardiogram done in the hospital in October 26, 2020 showed overall left ventricle ejection fraction be normal, left atrium however was severely dilated by volume.  Right atrium was moderately dilated.  There is aortic stenosis with mean gradient being very low however dimensionless index seems to be significant.  We will make arrangements for repeating echocardiogram with specific look at aortic stenosis. Recent Labs: No results found for requested labs within last 8760 hours.  Recent Lipid Panel No results found for: CHOL, TRIG, HDL, CHOLHDL, VLDL, LDLCALC, LDLDIRECT  Physical Exam:    VS:  BP 112/62 (BP Location: Left Arm, Patient Position: Sitting)   Pulse (!) 109   Ht 5\' 7"  (1.702 m)   Wt 138 lb 3.2 oz (62.7 kg) Comment: 138 lb  SpO2 94%   BMI 21.65 kg/m     Wt Readings from Last 3 Encounters:  11/15/20 138 lb 3.2 oz (62.7 kg)  11/04/17 160 lb (72.6 kg)     GEN:  Well nourished, well developed in no acute distress HEENT: Normal NECK: No JVD; No carotid bruits LYMPHATICS: No lymphadenopathy CARDIAC: Irregularly irregular, there is systolic ejection murmur best heard at right upper portion of the sternum, no rubs, no gallops RESPIRATORY:  Clear to auscultation without rales, wheezing or rhonchi  ABDOMEN: Soft, non-tender,  non-distended MUSCULOSKELETAL:  No edema; No deformity  SKIN: Warm and dry LOWER EXTREMITIES: no swelling NEUROLOGIC:  Alert and oriented x 3 PSYCHIATRIC:  Normal affect   ASSESSMENT:    1. Atrial fibrillation, unspecified type (HCC)   2. Aortic valve sclerosis    PLAN:    In order of problems listed above:  Atrial fibrillation which is persistent.  Her CHADS2 vascular is 4, continue anticoagulation.  We did talk about options for her scenario I think the best option would be to perform electrical cardioversion she will be ready in about 10 days.  Procedure being explained to her including all risk benefits as well as alternative she is ready to continue.  We will continue Eliquis.  Her rate appears to be  controlled continue AV blockade. Aortic stenosis only mild.  Continue present management. Essential hypertension blood pressure seems well controlled continue present management. Chronic neck pain.  I told her should be ready for surgery in about 3 weeks after cardioversion we will but will be able to interrupt anticoagulation for few days   Medication Adjustments/Labs and Tests Ordered: Current medicines are reviewed at length with the patient today.  Concerns regarding medicines are outlined above.  Orders Placed This Encounter  Procedures   Basic metabolic panel   CBC with Differential/Platelet   EKG 12-Lead   Medication changes: No orders of the defined types were placed in this encounter.   Signed, Georgeanna Lea, MD, [facility] 11/15/2020 11:54 AM    [facility]

## 2020-11-15 NOTE — Patient Instructions (Signed)
Medication Instructions:  Your physician recommends that you continue on your current medications as directed. Please refer to the Current Medication list given to you today.  *If you need a refill on your cardiac medications before your next appointment, please call your pharmacy*   Lab Work: Your physician recommends that you return for lab work in:  NEXT WEEK: BMET, CBC If you have labs (blood work) drawn today and your tests are completely normal, you will receive your results only by: MyChart Message (if you have MyChart) OR A paper copy in the mail If you have any lab test that is abnormal or we need to change your treatment, we will call you to review the results.   Testing/Procedures: Your physician has recommended that you have a Cardioversion (DCCV). Electrical Cardioversion uses a jolt of electricity to your heart either through paddles or wired patches attached to your chest. This is a controlled, usually prescheduled, procedure. Defibrillation is done under light anesthesia in the hospital, and you usually go home the day of the procedure. This is done to get your heart back into a normal rhythm. You are not awake for the procedure. Please see the instruction sheet given to you today.  You are scheduled for a Cardioversion on August 1st at [facility]. They will call you with further information.   DIET: Nothing to eat or drink after midnight except a sip of water with medications (see medication instructions below)  FYI: For your safety, and to allow [place] to monitor your vital signs accurately during the surgery/procedure we request that   if you have artificial nails, gel coating, SNS etc. Please have those removed prior to your surgery/procedure. Not having the nail coverings /polish removed may result in cancellation or delay of your surgery/procedure.   Medication Instructions: Continue your anticoagulant: Eliquis You will need to continue your anticoagulant after your  procedure until you  are told by your provider that it is safe to stop   Labs: NEXT WEEK  You must have a responsible person to drive you home and stay in the waiting area during your procedure. Failure to do so could result in cancellation.  Bring your insurance cards.  *Special Note: Every effort is made to have your procedure done on time. Occasionally there are emergencies that occur at the hospital that may cause delays. Please be patient if a delay does occur.     Follow-Up: At [facility] you and your health needs are our priority.  As part of our continuing mission to provide you with exceptional heart care, we have created designated Provider Care Teams.  These Care Teams include your primary Cardiologist (physician) and Advanced Practice Providers (APPs -  Physician Assistants and Nurse Practitioners) who all work together to provide you with the care you need, when you need it.  We recommend signing up for the patient portal called "MyChart".  Sign up information is provided on this After Visit Summary.  MyChart is used to connect with patients for Virtual Visits (Telemedicine).  Patients are able to view lab/test results, encounter notes, upcoming appointments, etc.  Non-urgent messages can be sent to your provider as well.   To learn more about what you can do with MyChart, go to [email or web address].    Your next appointment:   3 week(s) after Cardioversion  The format for your next appointment:   In Person  Provider:   Gypsy Balsam, MD   Other Instructions

## 2020-11-16 ENCOUNTER — Other Ambulatory Visit: Payer: Self-pay

## 2020-11-16 DIAGNOSIS — I358 Other nonrheumatic aortic valve disorders: Secondary | ICD-10-CM

## 2020-11-24 ENCOUNTER — Ambulatory Visit (INDEPENDENT_AMBULATORY_CARE_PROVIDER_SITE_OTHER): Payer: Medicare Other

## 2020-11-24 ENCOUNTER — Other Ambulatory Visit: Payer: Self-pay

## 2020-11-24 ENCOUNTER — Other Ambulatory Visit: Payer: Self-pay | Admitting: Cardiology

## 2020-11-24 DIAGNOSIS — I358 Other nonrheumatic aortic valve disorders: Secondary | ICD-10-CM

## 2020-11-24 LAB — ECHOCARDIOGRAM COMPLETE
AR max vel: 0.84 cm2
AV Area VTI: 0.74 cm2
AV Area mean vel: 0.8 cm2
AV Mean grad: 8 mmHg
AV Peak grad: 13.5 mmHg
Ao pk vel: 1.84 m/s
Area-P 1/2: 10.99 cm2
Calc EF: 50.5 %
S' Lateral: 3.5 cm
Single Plane A2C EF: 64.6 %
Single Plane A4C EF: 39.2 %

## 2020-11-24 NOTE — Progress Notes (Signed)
Complete echocardiogram performed.  Jimmy Asanti Craigo RDCS, RVT  

## 2020-11-28 ENCOUNTER — Telehealth: Payer: Self-pay

## 2020-11-28 DIAGNOSIS — I4891 Unspecified atrial fibrillation: Secondary | ICD-10-CM | POA: Diagnosis not present

## 2020-11-28 MED ORDER — DILTIAZEM HCL ER COATED BEADS 180 MG PO CP24: 180.0000 mg | ORAL_CAPSULE | Freq: Every day | ORAL | 2 refills | Status: DC

## 2020-11-28 MED ORDER — METOPROLOL TARTRATE 50 MG PO TABS: 50.0000 mg | ORAL_TABLET | Freq: Two times a day (BID) | ORAL | 2 refills | Status: DC

## 2020-11-28 MED ORDER — DILTIAZEM HCL ER COATED BEADS 180 MG PO CP24: 180.0000 mg | ORAL_CAPSULE | Freq: Every day | ORAL | 0 refills | Status: DC

## 2020-11-28 MED ORDER — ELIQUIS 5 MG PO TABS
5.0000 mg | ORAL_TABLET | Freq: Two times a day (BID) | ORAL | 2 refills | Status: DC
Start: 2020-11-28 — End: 2021-08-07

## 2020-11-28 NOTE — Telephone Encounter (Signed)
   [facility] Pre-operative Risk Assessment    Patient Name: Maria Navarro  DOB: [birth year] MRN: [id number]  HEARTCARE STAFF:  - IMPORTANT!!!!!! Under Visit Info/Reason for Call, type in Other and utilize the format Clearance MM/DD/YY or Clearance TBD. Do not use dashes or single digits. - Please review there is not already an duplicate clearance open for this procedure. - If request is for dental extraction, please clarify the # of teeth to be extracted. - If the patient is currently at the dentist's office, call Pre-Op Callback Staff (MA/nurse) to input urgent request.  - If the patient is not currently in the dentist office, please route to the Pre-Op pool.  Request for surgical clearance:  What type of surgery is being performed? Cervical 4-7 anterior cervial discectomy and interbody fusion and other procedures as indicated.   When is this surgery scheduled? TBD  What type of clearance is required (medical clearance vs. Pharmacy clearance to hold med vs. Both)? Both  Are there any medications that need to be held prior to surgery and how long? None noted  Practice name and name of physician performing surgery? [facility]. Dr. Charlott Holler  What is the office phone number? [phone]   7.   What is the office fax number? [phone]  8.   Anesthesia type (None, local, MAC, general) ? General   Gita Kudo 11/28/2020, 4:30 PM  _________________________________________________________________   (provider comments below)

## 2020-11-28 NOTE — Telephone Encounter (Signed)
Called and spoke with patient about making sure she is taking her medications in order for her to be cardioverted as she was supposed to be cardioverted today and unfortunately could not due to lack of medication and not taking it properly.  I have sent refills as the patient requested to pharmacy of choice and she will pick them up and take them accordingly.  Patient has a follow up appointment with Dr. Bing Matter on 12/19/20.

## 2020-11-30 NOTE — Telephone Encounter (Signed)
Based on the previous cardiology note, the current plan is for the patient to undergo cardioversion and delay the neck surgery until several weeks after the cardioversion.  Will defer ultimate timing of the surgery to MD. Probably will be September at the earliest time.   Also note that her cardioversion on 11/28/2020 was canceled as she missed her medication.  This will likely further delay the surgery.

## 2020-12-08 NOTE — Telephone Encounter (Signed)
   Patient Name: Maria Navarro  DOB: Jul 24, 1940 MRN: [id number]  Primary Cardiologist: Dr. Bing Matter  Chart reviewed as part of pre-operative protocol coverage. I am coming on board covering the box today. Per prior cardiologist notes, the plan was to address her atrial fib then consider clearing for surgery. It would be best for this to be formally addressed in follow-up. Would keep appt as scheduled 12/19/20 with MD to discuss further but in the meantime this message has also been routed to MD regarding missed cardioversion.  I have added pre-op clearance to her appt notes on 12/19/20, otherwise no separate APP input needed at this time. Per office protocol, the provider should assess clearance at time of office visit and should forward their finalized clearance decision to requesting party below. I will remove this message from the pre-op box and route update to requesting surgeon.  Laurann Montana, PA-C 12/08/2020, 9:52 AM

## 2020-12-19 ENCOUNTER — Ambulatory Visit (INDEPENDENT_AMBULATORY_CARE_PROVIDER_SITE_OTHER): Payer: Medicare Other | Admitting: Cardiology

## 2020-12-19 ENCOUNTER — Other Ambulatory Visit: Payer: Self-pay

## 2020-12-19 ENCOUNTER — Encounter: Payer: Self-pay | Admitting: Cardiology

## 2020-12-19 VITALS — BP 118/68 | HR 72 | Ht 65.0 in | Wt 128.8 lb

## 2020-12-19 DIAGNOSIS — I358 Other nonrheumatic aortic valve disorders: Secondary | ICD-10-CM

## 2020-12-19 DIAGNOSIS — K219 Gastro-esophageal reflux disease without esophagitis: Secondary | ICD-10-CM | POA: Diagnosis not present

## 2020-12-19 DIAGNOSIS — I10 Essential (primary) hypertension: Secondary | ICD-10-CM | POA: Diagnosis not present

## 2020-12-19 DIAGNOSIS — I4819 Other persistent atrial fibrillation: Secondary | ICD-10-CM | POA: Diagnosis not present

## 2020-12-19 NOTE — Patient Instructions (Addendum)
Medication Instructions:  Your physician recommends that you continue on your current medications as directed. Please refer to the Current Medication list given to you today.  *If you need a refill on your cardiac medications before your next appointment, please call your pharmacy*   Lab Work: Your physician recommends that you return for lab work in: next week for bmp and cbc  If you have labs (blood work) drawn today and your tests are completely normal, you will receive your results only by: MyChart Message (if you have MyChart) OR A paper copy in the mail If you have any lab test that is abnormal or we need to change your treatment, we will call you to review the results.   Testing/Procedures: Your physician has recommended that you have a Cardioversion (DCCV). Electrical Cardioversion uses a jolt of electricity to your heart either through paddles or wired patches attached to your chest. This is a controlled, usually prescheduled, procedure. Defibrillation is done under light anesthesia in the hospital, and you usually go home the day of the procedure. This is done to get your heart back into a normal rhythm. You are not awake for the procedure. Please see the instruction sheet given to you today. [facility] will call day before procedure with time to arrive. DATE OF CARDIOVERSION IS SEPT. 1ST AT [facility] HOSPTAL    Follow-Up: At [facility], you and your health needs are our priority.  As part of our continuing mission to provide you with exceptional heart care, we have created designated Provider Care Teams.  These Care Teams include your primary Cardiologist (physician) and Advanced Practice Providers (APPs -  Physician Assistants and Nurse Practitioners) who all work together to provide you with the care you need, when you need it.  We recommend signing up for the patient portal called "MyChart".  Sign up information is provided on this After Visit Summary.  MyChart is  used to connect with patients for Virtual Visits (Telemedicine).  Patients are able to view lab/test results, encounter notes, upcoming appointments, etc.  Non-urgent messages can be sent to your provider as well.   To learn more about what you can do with MyChart, go to [email or web address].    Your next appointment:   1 month(s)  The format for your next appointment:   In Person  Provider:   Gypsy Balsam, MD   Other Instructions  DIET: Nothing to eat or drink after midnight except a sip of water with medications (see medication instructions below)  Continue your anticoagulant: Eliquis You will need to continue your anticoagulant after your procedure until you  are told by your  Provider that it is safe to stop   Labs: IEarly next week You must have a responsible person to drive you home and stay in the waiting area during your procedure. Failure to do so could result in cancellation.  Bring your insurance cards.  *Special Note: Every effort is made to have your procedure done on time. Occasionally there are emergencies that occur at the hospital that may cause delays. Please be patient if a delay does occur.

## 2020-12-19 NOTE — Progress Notes (Signed)
Cardiology Office Note:    Date:  12/19/2020   ID:  Maria Navarro, DOB 1940-05-11, MRN [id number]  PCP:  Olive Bass, MD  Cardiologist:  Gypsy Balsam, MD    Referring MD: Olive Bass, MD   Chief Complaint  Patient presents with   Pre op for cardioversion    History of Present Illness:    Maria Navarro is a 80 y.o. female with past medical history significant for mild aortic stenosis, GERD, she does have a neck problem she was scheduled to have, elective neck surgery she came to hospital she was found to be atrial fibrillation with a controlled ventricular rate.  That being appropriately controlled with AV blocking agents she was put on anticoagulation she was scheduled to have cardioversion done however when she went for cardioversion it came to realization that she missed few dosages of Eliquis.  Therefore cardioversion has been canceled.  She comes today to my office talk about potential cardioversion again we will try to do it next week who will be 4 weeks of uninterrupted anticoagulation.  Overall she is doing well.  She denies have any chest pain tightness squeezing pressure burning chest.  She even tried to work in the garden because problem is neck pain and she is anxious where we are waiting for neck surgery.  Past Medical History:  Diagnosis Date   Allergic rhinitis 08/10/2015   Anemia 11/15/2020   Formatting of this note might be different from the original. 10/2020: incidental HGB 11.1, MCV 96, RDW 14   Aortic valve sclerosis 05/14/2019   Formatting of this note might be different from the original. 06/02/2020: ECHO, mild   Atrial fibrillation (HCC) 10/26/2020   Formatting of this note might be different from the original. 10/26/2020: noted pre-op by anesthesia, rate 118, to ED   Benign hypertension 08/18/2015   Chronic bilateral low back pain without sciatica 05/30/2020   Family history of premature coronary artery disease 04/14/2018   GERD (gastroesophageal reflux  disease) 08/10/2015   Lumbar disc disease 05/30/2020   Mixed hyperlipidemia 10/03/2015   2018: declined rx 2019: declinded rx   Osteoarthritis 08/10/2015   Osteoporosis 10/03/2015   2008: -1.5 2017: -2.5 hip  Formatting of this note might be different from the original. 2008: -1.5 2017: -2.5 hip 2021: -2.6   Pain syndrome, chronic 08/10/2015   (2004) DX/RX: NSAID (2006) DX: NSAID Gastric ulcer with esoph stricture  (2006) RX: tramadol   Plantar fasciitis 08/18/2016   Recurrent major depressive disorder, in remission (HCC) 08/10/2015   Screening for diabetes mellitus (DM) 10/03/2015    History reviewed. No pertinent surgical history.  Current Medications: Current Meds  Medication Sig   BOSWELLIA-GLUCOSAMINE-VIT D PO Take 1 tablet by mouth daily. Unknown strength   buPROPion (WELLBUTRIN XL) 300 MG 24 hr tablet Take 300 mg by mouth daily.   cefdinir (OMNICEF) 300 MG capsule Take 300 mg by mouth 2 (two) times daily.   cephALEXin (KEFLEX) 500 MG capsule Take 1 capsule (500 mg total) by mouth 2 (two) times daily.   diltiazem (CARDIZEM CD) 180 MG 24 hr capsule Take 1 capsule (180 mg total) by mouth daily.   DULoxetine (CYMBALTA) 30 MG capsule Take 30 mg by mouth 2 (two) times daily.   ELIQUIS 5 MG TABS tablet Take 1 tablet (5 mg total) by mouth 2 (two) times daily.   fexofenadine (ALLEGRA) 180 MG tablet Take 180 mg by mouth daily.   iron polysaccharides (NIFEREX) 150 MG capsule  Take 150 mg by mouth daily.   metoprolol tartrate (LOPRESSOR) 50 MG tablet Take 1 tablet (50 mg total) by mouth 2 (two) times daily.   pantoprazole (PROTONIX) 40 MG tablet Take 40 mg by mouth 2 (two) times daily.   PERCOCET 5-325 MG tablet Take 1 tablet by mouth every 6 (six) hours as needed for pain.   traMADol (ULTRAM) 50 MG tablet Take 50 mg by mouth every 6 (six) hours as needed for moderate pain or severe pain.   Turmeric (QC TUMERIC COMPLEX PO) Take 1 tablet by mouth daily. Unknown strenght     Allergies:   Escitalopram    Social History   Socioeconomic History   Marital status: Widowed    Spouse name: Not on file   Number of children: Not on file   Years of education: Not on file   Highest education level: Not on file  Occupational History   Not on file  Tobacco Use   Smoking status: Never   Smokeless tobacco: Never  Substance and Sexual Activity   Alcohol use: Not on file   Drug use: Not on file   Sexual activity: Not on file  Other Topics Concern   Not on file  Social History Narrative   Not on file   Social Determinants of Health   Financial Resource Strain: Not on file  Food Insecurity: Not on file  Transportation Needs: Not on file  Physical Activity: Not on file  Stress: Not on file  Social Connections: Not on file     Family History: The patient's family history is not on file. ROS:   Please see the history of present illness.    All 14 point review of systems negative except as described per history of present illness  EKGs/Labs/Other Studies Reviewed:      Recent Labs: No results found for requested labs within last 8760 hours.  Recent Lipid Panel No results found for: CHOL, TRIG, HDL, CHOLHDL, VLDL, LDLCALC, LDLDIRECT  Physical Exam:    VS:  BP 118/68 (BP Location: Right Arm, Patient Position: Sitting)   Pulse 72   Ht 5\' 5"  (1.651 m)   Wt 128 lb 12.8 oz (58.4 kg)   SpO2 93%   BMI 21.43 kg/m     Wt Readings from Last 3 Encounters:  12/19/20 128 lb 12.8 oz (58.4 kg)  11/15/20 138 lb 3.2 oz (62.7 kg)  11/04/17 160 lb (72.6 kg)     GEN:  Well nourished, well developed in no acute distress HEENT: Normal NECK: No JVD; No carotid bruits LYMPHATICS: No lymphadenopathy CARDIAC: Irregularly irregular, soft systolic murmur grade 2 out of 6 best heard at right upper portion of the sternum, no rubs, no gallops RESPIRATORY:  Clear to auscultation without rales, wheezing or rhonchi  ABDOMEN: Soft, non-tender, non-distended MUSCULOSKELETAL:  No edema; No deformity   SKIN: Warm and dry LOWER EXTREMITIES: no swelling NEUROLOGIC:  Alert and oriented x 3 PSYCHIATRIC:  Normal affect   ASSESSMENT:    1. Persistent atrial fibrillation (HCC)   2. Aortic valve sclerosis   3. Benign hypertension   4. Gastroesophageal reflux disease without esophagitis    PLAN:    In order of problems listed above:  Persistent atrial fibrillation rate appears to be controlled she is anticoagulant Eliquis which I will continue.  We will make arrangements for cardioversion next week. Aortic sclerosis with only mild stenosis we will continue monitoring. Benign essential hypertension blood pressure seems well controlled continue present management Gastroesophageal  reflux disease stable from that point review.  Again we discussed the procedure of cardioversion also told her that 3 to 4 weeks after cardioversion she will be able to have her neck surgery.  Her ability to exercise is decent enough to say that she is able to do 4 METS therefore should be fine for surgery.  I told her also that surgery can be done with atrial fibrillation however risks will be slightly higher therefore, I will be better to keep her in sinus rhythm.  I did review her multiple hospital visits for this appointment   Medication Adjustments/Labs and Tests Ordered: Current medicines are reviewed at length with the patient today.  Concerns regarding medicines are outlined above.  No orders of the defined types were placed in this encounter.  Medication changes: No orders of the defined types were placed in this encounter.   Signed, Georgeanna Lea, MD, [facility] 12/19/2020 12:57 PM    [facility]

## 2020-12-19 NOTE — Addendum Note (Signed)
Addended by: Roosvelt Harps R on: 12/19/2020 01:12 PM   Modules accepted: Orders

## 2020-12-23 LAB — BASIC METABOLIC PANEL
BUN/Creatinine Ratio: 16 (ref 12–28)
BUN: 16 mg/dL (ref 8–27)
CO2: 24 mmol/L (ref 20–29)
Calcium: 9.8 mg/dL (ref 8.7–10.3)
Chloride: 94 mmol/L — ABNORMAL LOW (ref 96–106)
Creatinine, Ser: 0.97 mg/dL (ref 0.57–1.00)
Glucose: 108 mg/dL — ABNORMAL HIGH (ref 65–99)
Potassium: 4.3 mmol/L (ref 3.5–5.2)
Sodium: 132 mmol/L — ABNORMAL LOW (ref 134–144)
eGFR: 59 mL/min/{1.73_m2} — ABNORMAL LOW (ref >59)

## 2020-12-23 LAB — CBC
Hematocrit: 41.5 % (ref 34.0–46.6)
Hemoglobin: 13.6 g/dL (ref 11.1–15.9)
MCH: 30.5 pg (ref 26.6–33.0)
MCHC: 32.8 g/dL (ref 31.5–35.7)
MCV: 93 fL (ref 79–97)
Platelets: 263 10*3/uL (ref 150–450)
RBC: 4.46 x10E6/uL (ref 3.77–5.28)
RDW: 12.6 % (ref 11.7–15.4)
WBC: 14.3 10*3/uL — ABNORMAL HIGH (ref 3.4–10.8)

## 2020-12-27 ENCOUNTER — Telehealth: Payer: Self-pay | Admitting: *Deleted

## 2020-12-27 NOTE — Telephone Encounter (Signed)
Faxed blood work to [facility] for Cardioversion scheduled for Sept. 1st 2022

## 2020-12-29 DIAGNOSIS — I4891 Unspecified atrial fibrillation: Secondary | ICD-10-CM | POA: Diagnosis not present

## 2020-12-29 HISTORY — PX: CARDIOVERSION: SHX1299

## 2021-01-17 ENCOUNTER — Ambulatory Visit (INDEPENDENT_AMBULATORY_CARE_PROVIDER_SITE_OTHER): Payer: Medicare Other | Admitting: Cardiology

## 2021-01-17 ENCOUNTER — Other Ambulatory Visit: Payer: Self-pay

## 2021-01-17 ENCOUNTER — Encounter: Payer: Self-pay | Admitting: Cardiology

## 2021-01-17 ENCOUNTER — Encounter: Payer: Self-pay | Admitting: *Deleted

## 2021-01-17 VITALS — BP 116/70 | HR 61 | Ht 63.5 in | Wt 134.6 lb

## 2021-01-17 DIAGNOSIS — I358 Other nonrheumatic aortic valve disorders: Secondary | ICD-10-CM

## 2021-01-17 DIAGNOSIS — I10 Essential (primary) hypertension: Secondary | ICD-10-CM | POA: Diagnosis not present

## 2021-01-17 DIAGNOSIS — I4819 Other persistent atrial fibrillation: Secondary | ICD-10-CM

## 2021-01-17 NOTE — Telephone Encounter (Signed)
Patient given detailed instructions per Myocardial Perfusion Study Information Sheet for the test on 01/18/21 at 1100. Patient notified to arrive 15 minutes early and that it is imperative to arrive on time for appointment to keep from having the test rescheduled.  If you need to cancel or reschedule your appointment, please call the office within 24 hours of your appointment. . Patient verbalized understanding.Maria Navarro, [name]

## 2021-01-17 NOTE — Progress Notes (Signed)
Cardiology Office Note:    Date:  01/17/2021   ID:  Maria Navarro, DOB 27-Jun-1940, MRN [id number]  PCP:  Olive Bass, MD  Cardiologist:  Gypsy Balsam, MD    Referring MD: Olive Bass, MD   Chief Complaint  Patient presents with   Cardioversion follow up    History of Present Illness:    Maria Navarro is a 80 y.o. female with past medical history significant for mild aortic stenosis, GERD, she does have a neck problem and she was scheduled to have neck surgery when she came to hospital she was find to be in atrial fibrillation with controlled ventricular rate she was appropriately controlled with AV blocking agent and she was put on anticoagulation she was scheduled to have elective cardioversion however when she went for cardioversion we did realize that she missed few dosages of Eliquis.  Therefore, cardioversion has been consult after that she came to me to talk about this issue she being successfully anticoagulated for 4 weeks she was sent for cardioversion.  In spite of 2 shocks of direct kind of 100 J and then 200 J we were unable to convert her to sinus rhythm.  She did not A. fib had 1 normal sinus beats.  She comes today to my office to talk about options for the situation.  Overall she seems to be doing well.  She denies have any chest pain tightness squeezing pressure burning chest no palpitations.  She still complaining of having neck pain and she still wants to have neck surgery done.  Past Medical History:  Diagnosis Date   Allergic rhinitis 08/10/2015   Anemia 11/15/2020   Formatting of this note might be different from the original. 10/2020: incidental HGB 11.1, MCV 96, RDW 14   Aortic valve sclerosis 05/14/2019   Formatting of this note might be different from the original. 06/02/2020: ECHO, mild   Atrial fibrillation (HCC) 10/26/2020   Formatting of this note might be different from the original. 10/26/2020: noted pre-op by anesthesia, rate 118, to ED   Benign  hypertension 08/18/2015   Chronic bilateral low back pain without sciatica 05/30/2020   Family history of premature coronary artery disease 04/14/2018   GERD (gastroesophageal reflux disease) 08/10/2015   Lumbar disc disease 05/30/2020   Mixed hyperlipidemia 10/03/2015   2018: declined rx 2019: declinded rx   Osteoarthritis 08/10/2015   Osteoporosis 10/03/2015   2008: -1.5 2017: -2.5 hip  Formatting of this note might be different from the original. 2008: -1.5 2017: -2.5 hip 2021: -2.6   Pain syndrome, chronic 08/10/2015   (2004) DX/RX: NSAID (2006) DX: NSAID Gastric ulcer with esoph stricture  (2006) RX: tramadol   Plantar fasciitis 08/18/2016   Recurrent major depressive disorder, in remission (HCC) 08/10/2015   Screening for diabetes mellitus (DM) 10/03/2015    Past Surgical History:  Procedure Laterality Date   APPENDECTOMY     CARDIOVERSION  12/29/2020   CHOLECYSTECTOMY     FOOT SURGERY     Hernia repaired      Current Medications: Current Meds  Medication Sig   aspirin 81 MG chewable tablet Chew by mouth daily.   BOSWELLIA-GLUCOSAMINE-VIT D PO Take 1 tablet by mouth daily. Unknown strength   buPROPion (WELLBUTRIN XL) 300 MG 24 hr tablet Take 300 mg by mouth daily.   cefdinir (OMNICEF) 300 MG capsule Take 300 mg by mouth 2 (two) times daily.   cephALEXin (KEFLEX) 500 MG capsule Take 1 capsule (500 mg total)  by mouth 2 (two) times daily.   diltiazem (CARDIZEM CD) 180 MG 24 hr capsule Take 1 capsule (180 mg total) by mouth daily.   DULoxetine (CYMBALTA) 30 MG capsule Take 30 mg by mouth 2 (two) times daily.   ELIQUIS 5 MG TABS tablet Take 1 tablet (5 mg total) by mouth 2 (two) times daily.   fexofenadine (ALLEGRA) 180 MG tablet Take 180 mg by mouth daily.   iron polysaccharides (NIFEREX) 150 MG capsule Take 150 mg by mouth daily.   metoprolol tartrate (LOPRESSOR) 50 MG tablet Take 1 tablet (50 mg total) by mouth 2 (two) times daily.   ondansetron (ZOFRAN) 4 MG tablet Take 1 tablet (4 mg  total) by mouth every 8 (eight) hours as needed for nausea or vomiting.   pantoprazole (PROTONIX) 40 MG tablet Take 40 mg by mouth 2 (two) times daily.   PERCOCET 5-325 MG tablet Take 1 tablet by mouth every 6 (six) hours as needed for pain.   traMADol (ULTRAM) 50 MG tablet Take 50 mg by mouth every 6 (six) hours as needed for moderate pain or severe pain.   Turmeric (QC TUMERIC COMPLEX PO) Take 1 tablet by mouth daily. Unknown strenght     Allergies:   Escitalopram   Social History   Socioeconomic History   Marital status: Widowed    Spouse name: Not on file   Number of children: Not on file   Years of education: Not on file   Highest education level: Not on file  Occupational History   Not on file  Tobacco Use   Smoking status: Never   Smokeless tobacco: Never  Substance and Sexual Activity   Alcohol use: Not on file   Drug use: Not on file   Sexual activity: Not on file  Other Topics Concern   Not on file  Social History Narrative   Not on file   Social Determinants of Health   Financial Resource Strain: Not on file  Food Insecurity: Not on file  Transportation Needs: Not on file  Physical Activity: Not on file  Stress: Not on file  Social Connections: Not on file     Family History: The patient's Family history is unknown by patient. ROS:   Please see the history of present illness.    All 14 point review of systems negative except as described per history of present illness  EKGs/Labs/Other Studies Reviewed:      Recent Labs: 12/23/2020: BUN 16; Creatinine, Ser 0.97; Hemoglobin 13.6; Platelets 263; Potassium 4.3; Sodium 132  Recent Lipid Panel No results found for: CHOL, TRIG, HDL, CHOLHDL, VLDL, LDLCALC, LDLDIRECT  Physical Exam:    VS:  BP 116/70 (BP Location: Right Arm, Patient Position: Sitting)   Pulse 61   Ht 5' 3.5" (1.613 m)   Wt 134 lb 9.6 oz (61.1 kg)   SpO2 95%   BMI 23.47 kg/m     Wt Readings from Last 3 Encounters:  01/17/21 134 lb  9.6 oz (61.1 kg)  12/19/20 128 lb 12.8 oz (58.4 kg)  11/15/20 138 lb 3.2 oz (62.7 kg)     GEN:  Well nourished, well developed in no acute distress HEENT: Normal NECK: No JVD; No carotid bruits LYMPHATICS: No lymphadenopathy CARDIAC: irreg irreg  does some, no murmurs, no rubs, no gallops RESPIRATORY:  Clear to auscultation without rales, wheezing or rhonchi  ABDOMEN: Soft, non-tender, non-distended MUSCULOSKELETAL:  No edema; No deformity  SKIN: Warm and dry LOWER EXTREMITIES: no swelling NEUROLOGIC:  Alert and oriented x 3 PSYCHIATRIC:  Normal affect   ASSESSMENT:    1. Persistent atrial fibrillation (HCC)   2. Aortic valve sclerosis   3. Benign hypertension    PLAN:    In order of problems listed above:  Persistent atrial fibrillation.  Unsuccessful cardioversion.  We discussed option for this scenario option being continuation of AV controlling agents as well as anticoagulation.  Of course she wants to have her neck surgery and I told her it is still visible to do surgery with atrial fibrillation of course anticoagulation need to be interrupted.  She still want to pursue a rhythm control strategy.  Therefore I will schedule her to have a stress test and that will help me to make a choice of antiarrhythmic therapy she is 74 is also probably amiodarone will be the choice. Aortic valve sclerosis no significant stenosis Benign essential hypertension blood pressure seems to be well controlled.   Medication Adjustments/Labs and Tests Ordered: Current medicines are reviewed at length with the patient today.  Concerns regarding medicines are outlined above.  Orders Placed This Encounter  Procedures   MYOCARDIAL PERFUSION IMAGING   EKG 12-Lead   Medication changes: No orders of the defined types were placed in this encounter.   Signed, Georgeanna Lea, MD, [facility] 01/17/2021 1:23 PM    [facility]

## 2021-01-17 NOTE — Patient Instructions (Signed)
Medication Instructions:  Your physician recommends that you continue on your current medications as directed. Please refer to the Current Medication list given to you today.  *If you need a refill on your cardiac medications before your next appointment, please call your pharmacy*   Lab Work: None If you have labs (blood work) drawn today and your tests are completely normal, you will receive your results only by: MyChart Message (if you have MyChart) OR A paper copy in the mail If you have any lab test that is abnormal or we need to change your treatment, we will call you to review the results.   Testing/Procedures: Your physician has requested that you have a lexiscan myoview. For further information please visit [email or web address] Please follow instruction sheet, as given.    Follow-Up: At [facility], you and your health needs are our priority.  As part of our continuing mission to provide you with exceptional heart care, we have created designated Provider Care Teams.  These Care Teams include your primary Cardiologist (physician) and Advanced Practice Providers (APPs -  Physician Assistants and Nurse Practitioners) who all work together to provide you with the care you need, when you need it.  We recommend signing up for the patient portal called "MyChart".  Sign up information is provided on this After Visit Summary.  MyChart is used to connect with patients for Virtual Visits (Telemedicine).  Patients are able to view lab/test results, encounter notes, upcoming appointments, etc.  Non-urgent messages can be sent to your provider as well.   To learn more about what you can do with MyChart, go to [email or web address].    Your next appointment:   2 months  The format for your next appointment:   In Person  Provider:   Gypsy Balsam, MD   Other Instructions

## 2021-01-18 ENCOUNTER — Ambulatory Visit (INDEPENDENT_AMBULATORY_CARE_PROVIDER_SITE_OTHER): Payer: Medicare Other

## 2021-01-18 DIAGNOSIS — I4819 Other persistent atrial fibrillation: Secondary | ICD-10-CM | POA: Diagnosis not present

## 2021-01-18 LAB — MYOCARDIAL PERFUSION IMAGING
LV dias vol: 55 mL (ref 46–106)
LV sys vol: 23 mL
Nuc Stress EF: 58 %
Peak HR: 96 {beats}/min
Rest HR: 86 {beats}/min
Rest Nuclear Isotope Dose: 10.9 mCi
SDS: 0
SRS: 3
SSS: 3
ST Depression (mm): 0 mm
Stress Nuclear Isotope Dose: 31.4 mCi
TID: 0.8

## 2021-01-18 MED ORDER — AMINOPHYLLINE 25 MG/ML IV SOLN
75.0000 mg | Freq: Once | INTRAVENOUS | Status: AC
  Administered 2021-01-18: 75 mg via INTRAVENOUS

## 2021-01-18 MED ORDER — REGADENOSON 0.4 MG/5ML IV SOLN
0.4000 mg | Freq: Once | INTRAVENOUS | Status: AC
  Administered 2021-01-18: 0.4 mg via INTRAVENOUS

## 2021-01-18 MED ORDER — TECHNETIUM TC 99M TETROFOSMIN IV KIT
10.9000 | PACK | Freq: Once | INTRAVENOUS | Status: AC | PRN
  Administered 2021-01-18: 10.9 via INTRAVENOUS

## 2021-01-18 MED ORDER — TECHNETIUM TC 99M TETROFOSMIN IV KIT
31.4000 | PACK | Freq: Once | INTRAVENOUS | Status: AC | PRN
  Administered 2021-01-18: 31.4 via INTRAVENOUS

## 2021-01-19 ENCOUNTER — Telehealth: Payer: Self-pay

## 2021-01-19 NOTE — Telephone Encounter (Signed)
Left message on patients voicemail to please return our call.   

## 2021-01-19 NOTE — Telephone Encounter (Signed)
-----   Message from Georgeanna Lea, MD sent at 01/19/2021  1:32 PM EDT ----- Stress test showing no evidence of ischemia

## 2021-01-19 NOTE — Telephone Encounter (Signed)
Patient is returning call to discuss stress test results. 

## 2021-01-19 NOTE — Telephone Encounter (Signed)
Spoke with patient regarding results and recommendation.  Patient verbalizes understanding and is agreeable to plan of care.  She would like to know if she is cleared for surgery since this came back normal. I told her that I would reach out to Dr. Bing Matter to get his input on this.

## 2021-01-20 NOTE — Telephone Encounter (Signed)
Spoke to the patient just now and let her know Dr. Krasowski's recommendations. She verbalizes understanding and thanks me for the call back.  

## 2021-01-20 NOTE — Telephone Encounter (Signed)
Patient returning call. She states she will be home the rest of the day.

## 2021-01-20 NOTE — Telephone Encounter (Signed)
Left message on patients voicemail to please return our call.   

## 2021-01-20 NOTE — Telephone Encounter (Signed)
Spoke to the patient just now and let her know these two options from Dr. Bing Matter. I let her know that the better/safer option is to start the amiodarone and then get her set up for an outpatient cardioversion. She tells me that she does not want to do this and is determined to get this surgery out of the way. She states that she will worry about her heart rhythm after the surgery if she needs to do so.   I will route to Dr. Bing Matter so that he can get her cleared for surgery with the pre-op team.

## 2021-01-24 ENCOUNTER — Telehealth: Payer: Self-pay | Admitting: Cardiology

## 2021-01-24 NOTE — Telephone Encounter (Signed)
   [facility] Pre-operative Risk Assessment    Patient Name: Maria Navarro  DOB: [birth year] MRN: [id number]  HEARTCARE STAFF:  - IMPORTANT!!!!!! Under Visit Info/Reason for Call, type in Other and utilize the format Clearance MM/DD/YY or Clearance TBD. Do not use dashes or single digits. - Please review there is not already an duplicate clearance open for this procedure. - If request is for dental extraction, please clarify the # of teeth to be extracted. - If the patient is currently at the dentist's office, call Pre-Op Callback Staff (MA/nurse) to input urgent request.  - If the patient is not currently in the dentist office, please route to the Pre-Op pool.  Request for surgical clearance:  What type of surgery is being performed? Neck Fusion C4-C7   When is this surgery scheduled? TBD pending clearance   What type of clearance is required (medical clearance vs. Pharmacy clearance to hold med vs. Both)? Medical   Are there any medications that need to be held prior to surgery and how long? none  Practice name and name of physician performing surgery? Dr. Donivan Scull, [place]  What is the office phone number? [phone]   7.   What is the office fax number? [phone] Attn: Ivin Booty   8.   Anesthesia type (None, local, MAC, general) ? General    Johnna Acosta 01/24/2021, 8:47 AM  _________________________________________________________________   (provider comments below)

## 2021-01-25 NOTE — Telephone Encounter (Signed)
Patient with diagnosis of afib on Eliquis for anticoagulation.    Procedure: Neck Fusion C4-C7  Date of procedure: TBD  CHA2DS2-VASc Score = 4  This indicates a 4.8% annual risk of stroke. The patient's score is based upon: CHF History: 0 HTN History: 1 Diabetes History: 0 Stroke History: 0 Vascular Disease History: 0 Age Score: 2 Gender Score: 1   CrCl 83mL/min Platelet count 263K  Per office protocol, patient can hold Eliquis for 3 days prior to procedure.

## 2021-01-26 NOTE — Telephone Encounter (Signed)
    Patient Name: Maria Navarro  DOB: [birth year] MRN: [id number]  Primary Cardiologist: None  Chart reviewed as part of pre-operative protocol coverage.    The patient was seen by Dr. Bing Matter on 01/17/21. Per note "Persistent atrial fibrillation.  Unsuccessful cardioversion.  We discussed option for this scenario option being continuation of AV controlling agents as well as anticoagulation.  Of course she wants to have her neck surgery and I told her it is still visible to do surgery with atrial fibrillation of course anticoagulation need to be interrupted.  She still want to pursue a rhythm control strategy.  Therefore I will schedule her to have a stress test and that will help me to make a choice of antiarrhythmic therapy she is 14 is also probably amiodarone will be the choice".  Patient had low risk stress test on 01/18/21.  Given past medical history and time since last visit, based on ACC/AHA guidelines, [name] would be at acceptable risk for the planned procedure without further cardiovascular testing.   I will route this recommendation to the requesting party via Epic fax function and remove from pre-op pool.  Please call with questions.  [place], [place] 01/26/2021, 9:25 AM

## 2021-02-02 ENCOUNTER — Telehealth: Payer: Self-pay | Admitting: Cardiology

## 2021-02-02 NOTE — Telephone Encounter (Signed)
Called patient back informed her there is no note from a call. She doesn't need anything further.

## 2021-02-02 NOTE — Telephone Encounter (Signed)
Patient states she is returning a call from today.  

## 2021-03-21 ENCOUNTER — Encounter: Payer: Self-pay | Admitting: Cardiology

## 2021-03-21 ENCOUNTER — Ambulatory Visit (INDEPENDENT_AMBULATORY_CARE_PROVIDER_SITE_OTHER): Payer: Medicare Other | Admitting: Cardiology

## 2021-03-21 ENCOUNTER — Other Ambulatory Visit: Payer: Self-pay

## 2021-03-21 VITALS — BP 120/66 | HR 88 | Ht 63.0 in | Wt 133.0 lb

## 2021-03-21 DIAGNOSIS — I10 Essential (primary) hypertension: Secondary | ICD-10-CM

## 2021-03-21 DIAGNOSIS — I4819 Other persistent atrial fibrillation: Secondary | ICD-10-CM

## 2021-03-21 DIAGNOSIS — M4722 Other spondylosis with radiculopathy, cervical region: Secondary | ICD-10-CM | POA: Diagnosis not present

## 2021-03-21 DIAGNOSIS — I358 Other nonrheumatic aortic valve disorders: Secondary | ICD-10-CM | POA: Diagnosis not present

## 2021-03-21 NOTE — Progress Notes (Signed)
Cardiology Office Note:    Date:  03/21/2021   ID:  Maria Navarro, DOB 21-Jun-1940, MRN [id number]  PCP:  Olive Bass, MD  Cardiologist:  Gypsy Balsam, MD    Referring MD: Olive Bass, MD   Chief Complaint  Patient presents with   Follow-up  Doing much better after my neck surgery  History of Present Illness:    Maria Navarro is a 80 y.o. female  with past medical history significant for mild aortic stenosis, GERD, she does have a neck problem and she was scheduled to have neck surgery when she came to hospital she was find to be in atrial fibrillation with controlled ventricular rate she was appropriately controlled with AV blocking agent and she was put on anticoagulation she was scheduled to have elective cardioversion however when she went for cardioversion we did realize that she missed few dosages of Eliquis.  Therefore, cardioversion has been consult after that she came to me to talk about this issue she being successfully anticoagulated for 4 weeks she was sent for cardioversion.  In spite of 2 shocks of direct kind of 100 J and then 200 J we were unable to convert her to sinus rhythm. She comes today 2 months of follow-up.  She ended up having her surgery on the neck a month ago.  Since that time she is doing well.  Denies have any chest pain tightness squeezing pressure burning chest no palpitations no dizziness no swelling of lower extremities.  Overall she is doing well.  Past Medical History:  Diagnosis Date   Allergic rhinitis 08/10/2015   Anemia 11/15/2020   Formatting of this note might be different from the original. 10/2020: incidental HGB 11.1, MCV 96, RDW 14   Aortic valve sclerosis 05/14/2019   Formatting of this note might be different from the original. 06/02/2020: ECHO, mild   Atrial fibrillation (HCC) 10/26/2020   Formatting of this note might be different from the original. 10/26/2020: noted pre-op by anesthesia, rate 118, to ED   Benign hypertension  08/18/2015   Chronic bilateral low back pain without sciatica 05/30/2020   Family history of premature coronary artery disease 04/14/2018   GERD (gastroesophageal reflux disease) 08/10/2015   Lumbar disc disease 05/30/2020   Mixed hyperlipidemia 10/03/2015   2018: declined rx 2019: declinded rx   Osteoarthritis 08/10/2015   Osteoporosis 10/03/2015   2008: -1.5 2017: -2.5 hip  Formatting of this note might be different from the original. 2008: -1.5 2017: -2.5 hip 2021: -2.6   Pain syndrome, chronic 08/10/2015   (2004) DX/RX: NSAID (2006) DX: NSAID Gastric ulcer with esoph stricture  (2006) RX: tramadol   Plantar fasciitis 08/18/2016   Recurrent major depressive disorder, in remission (HCC) 08/10/2015   Screening for diabetes mellitus (DM) 10/03/2015    Past Surgical History:  Procedure Laterality Date   APPENDECTOMY     CARDIOVERSION  12/29/2020   CHOLECYSTECTOMY     FOOT SURGERY     Hernia repaired      Current Medications: Current Meds  Medication Sig   aspirin 81 MG chewable tablet Chew by mouth daily.   BOSWELLIA-GLUCOSAMINE-VIT D PO Take 1 tablet by mouth daily. Unknown strength   buPROPion (WELLBUTRIN XL) 300 MG 24 hr tablet Take 300 mg by mouth daily.   diltiazem (CARDIZEM CD) 180 MG 24 hr capsule Take 1 capsule (180 mg total) by mouth daily.   DULoxetine (CYMBALTA) 30 MG capsule Take 30 mg by mouth 2 (two) times  daily.   ELIQUIS 5 MG TABS tablet Take 1 tablet (5 mg total) by mouth 2 (two) times daily.   fexofenadine (ALLEGRA) 180 MG tablet Take 180 mg by mouth daily.   iron polysaccharides (NIFEREX) 150 MG capsule Take 150 mg by mouth daily.   metoprolol tartrate (LOPRESSOR) 50 MG tablet Take 1 tablet (50 mg total) by mouth 2 (two) times daily.   ondansetron (ZOFRAN) 4 MG tablet Take 1 tablet (4 mg total) by mouth every 8 (eight) hours as needed for nausea or vomiting.   pantoprazole (PROTONIX) 40 MG tablet Take 40 mg by mouth 2 (two) times daily.   PERCOCET 5-325 MG tablet Take 1  tablet by mouth every 6 (six) hours as needed for pain.   traMADol (ULTRAM) 50 MG tablet Take 50 mg by mouth every 6 (six) hours as needed for moderate pain or severe pain.   Turmeric (QC TUMERIC COMPLEX PO) Take 1 tablet by mouth daily. Unknown strenght     Allergies:   Escitalopram   Social History   Socioeconomic History   Marital status: Widowed    Spouse name: Not on file   Number of children: Not on file   Years of education: Not on file   Highest education level: Not on file  Occupational History   Not on file  Tobacco Use   Smoking status: Never   Smokeless tobacco: Never  Substance and Sexual Activity   Alcohol use: Not on file   Drug use: Not on file   Sexual activity: Not on file  Other Topics Concern   Not on file  Social History Narrative   Not on file   Social Determinants of Health   Financial Resource Strain: Not on file  Food Insecurity: Not on file  Transportation Needs: Not on file  Physical Activity: Not on file  Stress: Not on file  Social Connections: Not on file     Family History: The patient's Family history is unknown by patient. ROS:   Please see the history of present illness.    All 14 point review of systems negative except as described per history of present illness  EKGs/Labs/Other Studies Reviewed:      Recent Labs: 12/23/2020: BUN 16; Creatinine, Ser 0.97; Hemoglobin 13.6; Platelets 263; Potassium 4.3; Sodium 132  Recent Lipid Panel No results found for: CHOL, TRIG, HDL, CHOLHDL, VLDL, LDLCALC, LDLDIRECT  Physical Exam:    VS:  BP 120/66 (BP Location: Right Arm, Patient Position: Sitting, Cuff Size: Normal)   Pulse 88   Ht 5\' 3"  (1.6 m)   Wt 133 lb (60.3 kg)   SpO2 99%   BMI 23.56 kg/m     Wt Readings from Last 3 Encounters:  03/21/21 133 lb (60.3 kg)  01/18/21 134 lb (60.8 kg)  01/17/21 134 lb 9.6 oz (61.1 kg)     GEN:  Well nourished, well developed in no acute distress HEENT: Normal NECK: No JVD; No carotid  bruits LYMPHATICS: No lymphadenopathy CARDIAC: Irregularly irregular, no murmurs, no rubs, no gallops RESPIRATORY:  Clear to auscultation without rales, wheezing or rhonchi  ABDOMEN: Soft, non-tender, non-distended MUSCULOSKELETAL:  No edema; No deformity  SKIN: Warm and dry LOWER EXTREMITIES: no swelling NEUROLOGIC:  Alert and oriented x 3 PSYCHIATRIC:  Normal affect   ASSESSMENT:    1. Persistent atrial fibrillation (HCC)   2. Aortic valve sclerosis   3. Benign hypertension   4. Cervical spondylosis with radiculopathy    PLAN:    In  order of problems listed above:  Persistent atrial fibrillation.  Rate appears to be controlled she is anticoagulated with Eliquis which I will continue.  We will discontinue her aspirin.  We did discuss option for the situation rate control strategy versus rhythm control strategy she still want to explore rhythm control strategy, therefore, I will refer her to our EP colleagues for evaluation for potential antiarrhythmic therapy.  And then hopefully with antiarrhythmic therapy will be able to convert her to sinus rhythm.  In the meantime we will continue anticoagulation Aortic stenosis only mild, echocardiogram reviewed from the hospital.  Continue present management. Essential hypertension, blood pressure well controlled continue present management. Cervical spondylosis with radiculopathy, she did have surgery recovering doing well   Medication Adjustments/Labs and Tests Ordered: Current medicines are reviewed at length with the patient today.  Concerns regarding medicines are outlined above.  No orders of the defined types were placed in this encounter.  Medication changes: No orders of the defined types were placed in this encounter.   Signed, Georgeanna Lea, MD, [facility] 03/21/2021 3:21 PM    [facility]

## 2021-03-21 NOTE — Patient Instructions (Signed)

## 2021-03-22 ENCOUNTER — Telehealth (HOSPITAL_COMMUNITY): Payer: Self-pay

## 2021-03-22 NOTE — Telephone Encounter (Signed)
Tried to reach patient regarding ED follow up appointment to be seen at the [facility]. Left message for patient to call back.

## 2021-03-30 NOTE — Progress Notes (Signed)
Primary Care Physician: Olive Bass, MD Primary Cardiologist: Dr Bing Matter Primary Electrophysiologist: none Referring Physician: Dr Genevieve Norlander is a 80 y.o. female with a history of HTN, aortic stenosis, atrial fibrillation who presents for consultation in the [facility]. She was admitted to [facility] for elective neck surgery however she was noted to be in atrial fibrillation with fast ventricular rate. She surgery was postponed and she was referred to cardiology. She underwent DCCV on 12/29/20 which was unsuccessful. Patient elected to defer rhythm control and have neck surgery. Patient is on Eliquis for a CHADS2VASC score of 4. She remains in rate controlled afib today. She does report intermittent SOB and dizziness.   Today, she denies symptoms of palpitations, chest pain, orthopnea, PND, lower extremity edema, presyncope, syncope, snoring, daytime somnolence, bleeding, or neurologic sequela. The patient is tolerating medications without difficulties and is otherwise without complaint today.    Atrial Fibrillation Risk Factors:  she does not have symptoms or diagnosis of sleep apnea. she does not have a history of rheumatic fever. she does not have a history of alcohol use.   she has a BMI of Body mass index is 23.35 kg/m.Marland Kitchen Filed Weights   03/31/21 0904  Weight: 59.8 kg    Family History  Family history unknown: Yes     Atrial Fibrillation Management history:  Previous antiarrhythmic drugs: none Previous cardioversions: 12/29/20 Previous ablations: none CHADS2VASC score: 4 Anticoagulation history: Eliquis   Past Medical History:  Diagnosis Date   Allergic rhinitis 08/10/2015   Anemia 11/15/2020   Formatting of this note might be different from the original. 10/2020: incidental HGB 11.1, MCV 96, RDW 14   Aortic valve sclerosis 05/14/2019   Formatting of this note might be different from the original. 06/02/2020: ECHO,  mild   Atrial fibrillation (HCC) 10/26/2020   Formatting of this note might be different from the original. 10/26/2020: noted pre-op by anesthesia, rate 118, to ED   Benign hypertension 08/18/2015   Chronic bilateral low back pain without sciatica 05/30/2020   Family history of premature coronary artery disease 04/14/2018   GERD (gastroesophageal reflux disease) 08/10/2015   Lumbar disc disease 05/30/2020   Mixed hyperlipidemia 10/03/2015   2018: declined rx 2019: declinded rx   Osteoarthritis 08/10/2015   Osteoporosis 10/03/2015   2008: -1.5 2017: -2.5 hip  Formatting of this note might be different from the original. 2008: -1.5 2017: -2.5 hip 2021: -2.6   Pain syndrome, chronic 08/10/2015   (2004) DX/RX: NSAID (2006) DX: NSAID Gastric ulcer with esoph stricture  (2006) RX: tramadol   Plantar fasciitis 08/18/2016   Recurrent major depressive disorder, in remission (HCC) 08/10/2015   Screening for diabetes mellitus (DM) 10/03/2015   Past Surgical History:  Procedure Laterality Date   APPENDECTOMY     CARDIOVERSION  12/29/2020   CHOLECYSTECTOMY     FOOT SURGERY     Hernia repaired      Current Outpatient Medications  Medication Sig Dispense Refill   acetaminophen (TYLENOL) 500 MG tablet Take 500 mg by mouth as needed.     Boswellia-Glucosamine-Vit D (OSTEO BI-FLEX ONE PER DAY PO) Take 2 tablets by mouth every morning.     buPROPion (WELLBUTRIN XL) 300 MG 24 hr tablet Take 300 mg by mouth daily.     Casanthranol-Docusate Sodium (LAXATIVE PLUS STOOL SOFTENER PO) Take 2 tablets by mouth every morning.     Cholecalciferol (VITAMIN D3) 50 MCG (2000 UT) TABS  Take 1 tablet by mouth every morning.     Cyanocobalamin (VITAMIN B12) 1000 MCG TBCR Take 1 tablet by mouth every morning.     diltiazem (CARDIZEM CD) 180 MG 24 hr capsule Take 1 capsule (180 mg total) by mouth daily. 90 capsule 2   DOCUSATE SODIUM PO Take by mouth. Taking 2 tablets by mouth daily     DULoxetine (CYMBALTA) 30 MG capsule Take 30 mg  by mouth 2 (two) times daily.     ELIQUIS 5 MG TABS tablet Take 1 tablet (5 mg total) by mouth 2 (two) times daily. 180 tablet 2   fexofenadine (ALLEGRA) 180 MG tablet Take 180 mg by mouth daily.     iron polysaccharides (NIFEREX) 150 MG capsule Take 150 mg by mouth every other day.     metoprolol tartrate (LOPRESSOR) 50 MG tablet Take 1 tablet (50 mg total) by mouth 2 (two) times daily. 180 tablet 2   Multiple Vitamins-Minerals (VISION FORMULA PO) Take 1 tablet by mouth every morning.     Omega-3 Fatty Acids (OMEGA-3 FISH OIL PO) Take 1,000 mg by mouth every morning.     pantoprazole (PROTONIX) 40 MG tablet Take 40 mg by mouth 2 (two) times daily.     Probiotic Product (PHILLIPS COLON HEALTH) CAPS Take 1 capsule by mouth every morning.     traMADol (ULTRAM) 50 MG tablet Take 50 mg by mouth every 6 (six) hours as needed for moderate pain or severe pain.     Turmeric (QC TUMERIC COMPLEX PO) Take 1 tablet by mouth daily. Unknown strenght     No current facility-administered medications for this encounter.    Allergies  Allergen Reactions   Escitalopram Other (See Comments)    sleepy    Social History   Socioeconomic History   Marital status: Widowed    Spouse name: Not on file   Number of children: Not on file   Years of education: Not on file   Highest education level: Not on file  Occupational History   Not on file  Tobacco Use   Smoking status: Never   Smokeless tobacco: Never  Substance and Sexual Activity   Alcohol use: Not on file   Drug use: Not on file   Sexual activity: Not on file  Other Topics Concern   Not on file  Social History Narrative   Not on file   Social Determinants of Health   Financial Resource Strain: Not on file  Food Insecurity: Not on file  Transportation Needs: Not on file  Physical Activity: Not on file  Stress: Not on file  Social Connections: Not on file  Intimate Partner Violence: Not on file     ROS- All systems are reviewed and  negative except as per the HPI above.  Physical Exam: Vitals:   03/31/21 0904  BP: 134/66  Pulse: 94  Weight: 59.8 kg  Height: 5\' 3"  (1.6 m)    GEN- The patient is a well appearing elderly female, alert and oriented x 3 today.   Head- normocephalic, atraumatic Eyes-  Sclera clear, conjunctiva pink Ears- hearing intact Oropharynx- clear Neck- supple  Lungs- Clear to ausculation bilaterally, normal work of breathing Heart- irregular rate and rhythm, no murmurs, rubs or gallops  GI- soft, NT, ND, + BS Extremities- no clubbing, cyanosis, or edema MS- no significant deformity or atrophy Skin- no rash or lesion Psych- euthymic mood, full affect Neuro- strength and sensation are intact  Wt Readings from Last 3 Encounters:  03/31/21 59.8 kg  03/21/21 60.3 kg  01/18/21 60.8 kg    EKG today demonstrates  Atypical atrial flutter with variable block Vent. rate 94 BPM PR interval * ms QRS duration 122 ms QT/QTcB 380/475 ms  Echo 02/13/21 demonstrated  EF 60-65% Severely dilated LA, mod RAE Mild AS Mild MR   Epic records are reviewed at length today  CHA2DS2-VASc Score = 4  The patient's score is based upon: CHF History: 0 HTN History: 1 Diabetes History: 0 Stroke History: 0 Vascular Disease History: 0 Age Score: 2 Gender Score: 1       ASSESSMENT AND PLAN: 1. Persistent Atrial Fibrillation (ICD10:  I48.19) The patient's CHA2DS2-VASc score is 4, indicating a 4.8% annual risk of stroke.   We discussed rate vs rhythm control today. We specifically discussed flecainide, dofetilide, and amiodarone for rhythm control. She would like to avoid amiodarone given off target effects. Could consider flecainide but would be limited to lowest dose given she is on Wellbutrin. Information about dofetilide admission given today. She would like to take time to consider her options. She is unsure if her symptoms warrant a hospitalization for dofetilide at this time.  Continue Lopressor  50 mg BID Continue diltiazem 180 mg daily Continue Eliquis 5 mg BID  2. Secondary Hypercoagulable State (ICD10:  D68.69) The patient is at significant risk for stroke/thromboembolism based upon her CHA2DS2-VASc Score of 4.  Continue Apixaban (Eliquis).   3. HTN Stable, no changes today.   Follow up in the AF clinic in one month to revisit dofetilide loading.    Jorja Loa PA-C Afib [facility] [address] [place], [place] [address] [phone] 03/31/2021 3:37 PM

## 2021-03-31 ENCOUNTER — Telehealth: Payer: Self-pay | Admitting: Cardiology

## 2021-03-31 ENCOUNTER — Ambulatory Visit (HOSPITAL_COMMUNITY)
Admission: RE | Admit: 2021-03-31 | Discharge: 2021-03-31 | Disposition: A | Discharge status: Home / Self Care | Payer: Medicare Other | Source: Ambulatory Visit | Attending: Physician Assistant | Admitting: Physician Assistant

## 2021-03-31 ENCOUNTER — Other Ambulatory Visit: Payer: Self-pay

## 2021-03-31 VITALS — BP 134/66 | HR 94 | Ht 63.0 in | Wt 131.8 lb

## 2021-03-31 DIAGNOSIS — D6869 Other thrombophilia: Secondary | ICD-10-CM | POA: Diagnosis not present

## 2021-03-31 DIAGNOSIS — I4819 Other persistent atrial fibrillation: Secondary | ICD-10-CM | POA: Diagnosis not present

## 2021-03-31 DIAGNOSIS — Z7901 Long term (current) use of anticoagulants: Secondary | ICD-10-CM | POA: Diagnosis not present

## 2021-03-31 DIAGNOSIS — Z79899 Other long term (current) drug therapy: Secondary | ICD-10-CM | POA: Insufficient documentation

## 2021-03-31 DIAGNOSIS — R42 Dizziness and giddiness: Secondary | ICD-10-CM

## 2021-03-31 DIAGNOSIS — I350 Nonrheumatic aortic (valve) stenosis: Secondary | ICD-10-CM | POA: Diagnosis not present

## 2021-03-31 DIAGNOSIS — I10 Essential (primary) hypertension: Secondary | ICD-10-CM | POA: Insufficient documentation

## 2021-03-31 NOTE — Telephone Encounter (Signed)
felt sick got hot and started shaking.. not sure if this is the reaction to a medication or not. She says it feels as if shes drunk... please advise

## 2021-03-31 NOTE — Telephone Encounter (Signed)
Spoke to patient she reports after going to mailbox today she became "sick, feeling shaky and hot. She states she felt drunk. She got cold first then hot. This has been happening for awhile. She is going to come Monday for a monitor.

## 2021-04-03 ENCOUNTER — Other Ambulatory Visit: Payer: Self-pay

## 2021-04-03 ENCOUNTER — Ambulatory Visit (INDEPENDENT_AMBULATORY_CARE_PROVIDER_SITE_OTHER): Payer: Medicare Other

## 2021-04-03 DIAGNOSIS — R42 Dizziness and giddiness: Secondary | ICD-10-CM | POA: Diagnosis not present

## 2021-04-26 MED ORDER — DILTIAZEM HCL ER COATED BEADS 240 MG PO CP24
240.0000 mg | ORAL_CAPSULE | Freq: Every day | ORAL | 3 refills | Status: DC
Start: 2021-04-26 — End: 2021-05-16

## 2021-04-26 NOTE — Addendum Note (Signed)
Encounter addended by: Eleonore Chiquito, RN on: 04/26/2021 2:41 PM  Actions taken: Pharmacy for encounter modified, Order list changed

## 2021-05-10 ENCOUNTER — Ambulatory Visit (HOSPITAL_COMMUNITY)
Admission: RE | Admit: 2021-05-10 | Discharge: 2021-05-10 | Disposition: A | Discharge status: Home / Self Care | Payer: Medicare Other | Source: Ambulatory Visit | Attending: Physician Assistant | Admitting: Physician Assistant

## 2021-05-10 ENCOUNTER — Other Ambulatory Visit: Payer: Self-pay

## 2021-05-10 ENCOUNTER — Telehealth: Payer: Self-pay | Admitting: Pharmacist

## 2021-05-10 VITALS — BP 136/78 | HR 82 | Ht 66.0 in | Wt 137.0 lb

## 2021-05-10 DIAGNOSIS — I10 Essential (primary) hypertension: Secondary | ICD-10-CM | POA: Insufficient documentation

## 2021-05-10 DIAGNOSIS — Z79899 Other long term (current) drug therapy: Secondary | ICD-10-CM | POA: Insufficient documentation

## 2021-05-10 DIAGNOSIS — R040 Epistaxis: Secondary | ICD-10-CM | POA: Insufficient documentation

## 2021-05-10 DIAGNOSIS — D6869 Other thrombophilia: Secondary | ICD-10-CM | POA: Diagnosis not present

## 2021-05-10 DIAGNOSIS — I4819 Other persistent atrial fibrillation: Secondary | ICD-10-CM | POA: Insufficient documentation

## 2021-05-10 DIAGNOSIS — I484 Atypical atrial flutter: Secondary | ICD-10-CM | POA: Diagnosis not present

## 2021-05-10 DIAGNOSIS — I350 Nonrheumatic aortic (valve) stenosis: Secondary | ICD-10-CM | POA: Diagnosis not present

## 2021-05-10 DIAGNOSIS — Z7901 Long term (current) use of anticoagulants: Secondary | ICD-10-CM | POA: Diagnosis not present

## 2021-05-10 DIAGNOSIS — I4510 Unspecified right bundle-branch block: Secondary | ICD-10-CM | POA: Insufficient documentation

## 2021-05-10 LAB — BASIC METABOLIC PANEL
Anion gap: 9 (ref 5–15)
BUN: 17 mg/dL (ref 8–23)
CO2: 29 mmol/L (ref 22–32)
Calcium: 9.8 mg/dL (ref 8.9–10.3)
Chloride: 101 mmol/L (ref 98–111)
Creatinine, Ser: 0.97 mg/dL (ref 0.44–1.00)
GFR, Estimated: 59 mL/min — ABNORMAL LOW (ref >60)
Glucose, Bld: 100 mg/dL — ABNORMAL HIGH (ref 70–99)
Potassium: 4.2 mmol/L (ref 3.5–5.1)
Sodium: 139 mmol/L (ref 135–145)

## 2021-05-10 LAB — MAGNESIUM: Magnesium: 2.2 mg/dL (ref 1.7–2.4)

## 2021-05-10 NOTE — Progress Notes (Signed)
Primary Care Physician: Algis Greenhouse, MD Primary Cardiologist: Dr Agustin Cree Primary Electrophysiologist: none Referring Physician: Dr Selena Lesser is a 81 y.o. female with a history of HTN, aortic stenosis, atrial fibrillation who presents for follow up in the [facility]. She was admitted to [facility] for elective neck surgery however she was noted to be in atrial fibrillation with fast ventricular rate. She surgery was postponed and she was referred to cardiology. She underwent DCCV on 12/29/20 which was unsuccessful. Patient elected to defer rhythm control and have neck surgery. Patient is on Eliquis for a CHADS2VASC score of 4.   On follow up today, patient reports that her fatigue as worsened since her last visit. She is not able to work in her yard like she could 6 months ago. She did miss a dose of Eliquis on 05/02/21.   Today, she denies symptoms of palpitations, chest pain, orthopnea, PND, lower extremity edema, presyncope, syncope, snoring, daytime somnolence, bleeding, or neurologic sequela. The patient is tolerating medications without difficulties and is otherwise without complaint today.    Atrial Fibrillation Risk Factors:  she does not have symptoms or diagnosis of sleep apnea. she does not have a history of rheumatic fever. she does not have a history of alcohol use.   she has a BMI of Body mass index is 22.11 kg/m.Marland Kitchen Filed Weights   05/10/21 1136  Weight: 62.1 kg    Family History  Family history unknown: Yes     Atrial Fibrillation Management history:  Previous antiarrhythmic drugs: none Previous cardioversions: 12/29/20 Previous ablations: none CHADS2VASC score: 4 Anticoagulation history: Eliquis   Past Medical History:  Diagnosis Date   Allergic rhinitis 08/10/2015   Anemia 11/15/2020   Formatting of this note might be different from the original. 10/2020: incidental HGB 11.1, MCV 96, RDW 14   Aortic  valve sclerosis 05/14/2019   Formatting of this note might be different from the original. 06/02/2020: ECHO, mild   Atrial fibrillation ([place]) 10/26/2020   Formatting of this note might be different from the original. 10/26/2020: noted pre-op by anesthesia, rate 118, to ED   Benign hypertension 08/18/2015   Chronic bilateral low back pain without sciatica 05/30/2020   Family history of premature coronary artery disease 04/14/2018   GERD (gastroesophageal reflux disease) 08/10/2015   Lumbar disc disease 05/30/2020   Mixed hyperlipidemia 10/03/2015   2018: declined rx 2019: declinded rx   Osteoarthritis 08/10/2015   Osteoporosis 10/03/2015   2008: -1.5 2017: -2.5 hip  Formatting of this note might be different from the original. 2008: -1.5 2017: -2.5 hip 2021: -2.6   Pain syndrome, chronic 08/10/2015   (2004) DX/RX: NSAID (2006) DX: NSAID Gastric ulcer with esoph stricture  (2006) RX: tramadol   Plantar fasciitis 08/18/2016   Recurrent major depressive disorder, in remission ([place]) 08/10/2015   Screening for diabetes mellitus (DM) 10/03/2015   Past Surgical History:  Procedure Laterality Date   APPENDECTOMY     CARDIOVERSION  12/29/2020   CHOLECYSTECTOMY     FOOT SURGERY     Hernia repaired      Current Outpatient Medications  Medication Sig Dispense Refill   acetaminophen (TYLENOL) 500 MG tablet Take 500 mg by mouth as needed.     Boswellia-Glucosamine-Vit D (OSTEO BI-FLEX ONE PER DAY PO) Take 2 tablets by mouth every morning.     buPROPion (WELLBUTRIN XL) 300 MG 24 hr tablet Take 300 mg by mouth daily.  Casanthranol-Docusate Sodium (LAXATIVE PLUS STOOL SOFTENER PO) Take 2 tablets by mouth every morning.     Cholecalciferol (VITAMIN D3) 50 MCG (2000 UT) TABS Take 1 tablet by mouth every morning.     Cyanocobalamin (VITAMIN B12) 1000 MCG TBCR Take 1 tablet by mouth every morning.     diltiazem (CARDIZEM CD) 240 MG 24 hr capsule Take 1 capsule (240 mg total) by mouth daily. 90 capsule 3   DOCUSATE  SODIUM PO Take by mouth. Taking 2 tablets by mouth daily     DULoxetine (CYMBALTA) 30 MG capsule Take 30 mg by mouth 2 (two) times daily.     ELIQUIS 5 MG TABS tablet Take 1 tablet (5 mg total) by mouth 2 (two) times daily. 180 tablet 2   fexofenadine (ALLEGRA) 180 MG tablet Take 180 mg by mouth daily.     iron polysaccharides (NIFEREX) 150 MG capsule Take 150 mg by mouth every other day.     metoprolol tartrate (LOPRESSOR) 50 MG tablet Take 1 tablet (50 mg total) by mouth 2 (two) times daily. 180 tablet 2   Multiple Vitamins-Minerals (VISION FORMULA PO) Take 1 tablet by mouth every morning.     Omega-3 Fatty Acids (OMEGA-3 FISH OIL PO) Take 1,000 mg by mouth every morning.     pantoprazole (PROTONIX) 40 MG tablet Take 40 mg by mouth 2 (two) times daily.     Probiotic Product ([place]) CAPS Take 1 capsule by mouth every morning.     traMADol (ULTRAM) 50 MG tablet Take 50 mg by mouth every 6 (six) hours as needed for moderate pain or severe pain.     Turmeric (QC TUMERIC COMPLEX PO) Take 1 tablet by mouth daily. Unknown strenght     No current facility-administered medications for this encounter.    Allergies  Allergen Reactions   Escitalopram Other (See Comments)    sleepy    Social History   Socioeconomic History   Marital status: Widowed    Spouse name: Not on file   Number of children: Not on file   Years of education: Not on file   Highest education level: Not on file  Occupational History   Not on file  Tobacco Use   Smoking status: Never   Smokeless tobacco: Never  Substance and Sexual Activity   Alcohol use: Not on file   Drug use: Not on file   Sexual activity: Not on file  Other Topics Concern   Not on file  Social History Narrative   Not on file   Social Determinants of Health   Financial Resource Strain: Not on file  Food Insecurity: Not on file  Transportation Needs: Not on file  Physical Activity: Not on file  Stress: Not on file  Social  Connections: Not on file  Intimate Partner Violence: Not on file     ROS- All systems are reviewed and negative except as per the HPI above.  Physical Exam: Vitals:   05/10/21 1136  BP: 136/78  Pulse: 82  Weight: 62.1 kg  Height: 5\' 6"  (1.676 m)    GEN- The patient is a well appearing elderly female, alert and oriented x 3 today.   HEENT-head normocephalic, atraumatic, sclera clear, conjunctiva pink, hearing intact, trachea midline. Lungs- Clear to ausculation bilaterally, normal work of breathing Heart- irregular rate and rhythm, no murmurs, rubs or gallops  GI- soft, NT, ND, + BS Extremities- no clubbing, cyanosis, or edema MS- no significant deformity or atrophy Skin- no rash  or lesion Psych- euthymic mood, full affect Neuro- strength and sensation are intact   Wt Readings from Last 3 Encounters:  05/10/21 62.1 kg  04/03/21 59.4 kg  03/31/21 59.8 kg    EKG today demonstrates  Atypical atrial flutter with variable block vs coarse afib, RBBB Vent. rate 82 BPM PR interval * ms QRS duration 134 ms QT/QTcB 388/453 ms  Echo 02/13/21 demonstrated  EF 60-65% Severely dilated LA, mod RAE Mild AS Mild MR   Epic records are reviewed at length today  CHA2DS2-VASc Score = 4  The patient's score is based upon: CHF History: 0 HTN History: 1 Diabetes History: 0 Stroke History: 0 Vascular Disease History: 0 Age Score: 2 Gender Score: 1        ASSESSMENT AND PLAN: 1. Persistent Atrial Fibrillation (ICD10:  I48.19) The patient's CHA2DS2-VASc score is 4, indicating a 4.8% annual risk of stroke.   We discussed rate vs rhythm control again today.  She feels her fatigue has worsened and she would like to pursue rhythm control.  Patient wants to pursue dofetilide. Continue Eliquis 5 mg BID, stressed importance of not missing any doses. No recent benadryl use PharmD to screen medications QTc acceptable with RBBB. Check bmet/mag today. Continue Lopressor 50 mg  BID Continue diltiazem 240 mg daily  2. Secondary Hypercoagulable State (ICD10:  D68.69) The patient is at significant risk for stroke/thromboembolism based upon her CHA2DS2-VASc Score of 4.  Continue Apixaban (Eliquis). Patient inquires about Watchman today. She is concerned about her bleeding risk on anticoagulation given her h/o epistaxis. Will d/w Dr Quentin Ore when she comes in for dofetilide loading.   3. HTN Stable, no changes today.   Follow up in the AF clinic 05/30/21 for dofetilide loading.    [facility] [address] [place], [place] [address] [phone] 05/10/2021 12:18 PM

## 2021-05-10 NOTE — Telephone Encounter (Signed)
Medication list reviewed in anticipation of upcoming Tikosyn initiation. Patient is not taking any contraindicated or QTc prolonging medications.   Patient is anticoagulated on Eliquis 5mg BID on the appropriate dose. Please ensure that patient has not missed any anticoagulation doses in the 3 weeks prior to Tikosyn initiation.   Patient will need to be counseled to avoid use of Benadryl while on Tikosyn and in the 2-3 days prior to Tikosyn initiation. 

## 2021-05-16 ENCOUNTER — Telehealth: Payer: Self-pay | Admitting: Cardiology

## 2021-05-16 ENCOUNTER — Telehealth (HOSPITAL_COMMUNITY): Payer: Self-pay | Admitting: *Deleted

## 2021-05-16 DIAGNOSIS — I4819 Other persistent atrial fibrillation: Secondary | ICD-10-CM

## 2021-05-16 MED ORDER — DILTIAZEM HCL ER COATED BEADS 240 MG PO CP24: 240.0000 mg | ORAL_CAPSULE | Freq: Every day | ORAL | 3 refills | Status: DC

## 2021-05-16 NOTE — Telephone Encounter (Signed)
Refill sent in per request.  

## 2021-05-16 NOTE — Telephone Encounter (Signed)
°*  STAT* If patient is at the pharmacy, call can be transferred to refill team.   1. Which medications need to be refilled? (please list name of each medication and dose if known) new prescription for Diltiazem 240 mg  2. Which pharmacy/location (including street and city if local pharmacy) is medication to be sent to? [facility]  3. Do they need a 30 day or 90 day supply? 90 days and refills

## 2021-05-16 NOTE — Telephone Encounter (Addendum)
Patient called in this morning stated she has decided she would like to discuss watchman prior to committing to tikosyn admission. Attempted to educate patient that watchman did nothing to effect her heart rhythm but states she feels getting off Eliquis is her main concern at this time. Stated she cut her finger yesterday and it took 2 hours to completely stop and this is affecting her lifestyle of gardening being on Eliquis.  Admission canceled per patient request and referral placed to discuss with Dr. Lalla Brothers regarding watchman.

## 2021-05-30 ENCOUNTER — Ambulatory Visit (HOSPITAL_COMMUNITY): Payer: Medicare Other | Admitting: Physician Assistant

## 2021-06-13 ENCOUNTER — Encounter (INDEPENDENT_AMBULATORY_CARE_PROVIDER_SITE_OTHER): Payer: Self-pay

## 2021-06-13 ENCOUNTER — Other Ambulatory Visit: Payer: Self-pay

## 2021-06-13 ENCOUNTER — Encounter: Payer: Self-pay | Admitting: *Deleted

## 2021-06-13 ENCOUNTER — Encounter: Payer: Self-pay | Admitting: Cardiology

## 2021-06-13 ENCOUNTER — Ambulatory Visit (INDEPENDENT_AMBULATORY_CARE_PROVIDER_SITE_OTHER): Payer: Medicare Other | Admitting: Cardiology

## 2021-06-13 VITALS — BP 132/64 | HR 88 | Ht 66.0 in | Wt 138.0 lb

## 2021-06-13 DIAGNOSIS — Z01818 Encounter for other preprocedural examination: Secondary | ICD-10-CM

## 2021-06-13 DIAGNOSIS — R040 Epistaxis: Secondary | ICD-10-CM | POA: Diagnosis not present

## 2021-06-13 DIAGNOSIS — I4819 Other persistent atrial fibrillation: Secondary | ICD-10-CM

## 2021-06-13 MED ORDER — AMIODARONE HCL 200 MG PO TABS: ORAL_TABLET | ORAL | 3 refills | Status: DC

## 2021-06-13 NOTE — Progress Notes (Signed)
Electrophysiology Office Note:    Date:  06/13/2021   ID:  [name], DOB 1940/07/14, MRN [id number]  PCP:  Algis Greenhouse, MD  [facility] Cardiologist:  None  [facility] Electrophysiologist:  Vickie Epley, MD   Referring MD: Oliver Barre, PA   Chief Complaint: Consult for Watchman device  History of Present Illness:    Maria Navarro is a 81 y.o. female who presents for an evaluation and discussion of Watchman device at the request of Adline Peals, [place]. Their medical history includes atrial fibrillation, aortic valve sclerosis, hypertension, hyperlipidemia, GERD, anemia,   She was last seen by Adline Peals, PA on 05/10/2021. She was concerned about her bleeding risk on anticoagulation given her history of epistaxis. Therefore she is interested in [place].  Overall, she is feeling very fatigued. She is only able to vacuum a little bit at a time. When walking to the mailbox she is initially okay, but she becomes severely fatigued and short of breath by the time she returns to her house.  She is unsure how long she has been in atrial fibrillation. She states it was first discovered when she was preparing to have a neck fusion procedure.  Also, she notes recent bleeding issues including epistaxis and hematuria.  Usually she remains active with yard work and gardening. Her fatigue and shortness of breath has limited her activity.  She denies any palpitations, chest pain, or peripheral edema. No lightheadedness, headaches, syncope, orthopnea, or PND.      Past Medical History:  Diagnosis Date   Allergic rhinitis 08/10/2015   Anemia 11/15/2020   Formatting of this note might be different from the original. 10/2020: incidental HGB 11.1, MCV 96, RDW 14   Aortic valve sclerosis 05/14/2019   Formatting of this note might be different from the original. 06/02/2020: ECHO, mild   Atrial fibrillation ([place]) 10/26/2020   Formatting of this note might be different from the  original. 10/26/2020: noted pre-op by anesthesia, rate 118, to ED   Benign hypertension 08/18/2015   Chronic bilateral low back pain without sciatica 05/30/2020   Family history of premature coronary artery disease 04/14/2018   GERD (gastroesophageal reflux disease) 08/10/2015   Lumbar disc disease 05/30/2020   Mixed hyperlipidemia 10/03/2015   2018: declined rx 2019: declinded rx   Osteoarthritis 08/10/2015   Osteoporosis 10/03/2015   2008: -1.5 2017: -2.5 hip  Formatting of this note might be different from the original. 2008: -1.5 2017: -2.5 hip 2021: -2.6   Pain syndrome, chronic 08/10/2015   (2004) DX/RX: NSAID (2006) DX: NSAID Gastric ulcer with esoph stricture  (2006) RX: tramadol   Plantar fasciitis 08/18/2016   Recurrent major depressive disorder, in remission ([place]) 08/10/2015   Screening for diabetes mellitus (DM) 10/03/2015    Past Surgical History:  Procedure Laterality Date   APPENDECTOMY     CARDIOVERSION  12/29/2020   CHOLECYSTECTOMY     FOOT SURGERY     Hernia repaired      Current Medications: Current Meds  Medication Sig   acetaminophen (TYLENOL) 500 MG tablet Take 500 mg by mouth as needed.   amiodarone (PACERONE) 200 MG tablet 400 mg (2 tablets) daily for 10 days, then 200 mg (1 tablet) daily from there   Boswellia-Glucosamine-Vit D (OSTEO BI-FLEX ONE PER DAY PO) Take 2 tablets by mouth every morning.   buPROPion (WELLBUTRIN XL) 300 MG 24 hr tablet Take 300 mg by mouth daily.   Casanthranol-Docusate Sodium (LAXATIVE PLUS STOOL  SOFTENER PO) Take 2 tablets by mouth every morning.   Cholecalciferol (VITAMIN D3) 50 MCG (2000 UT) TABS Take 1 tablet by mouth every morning.   Cyanocobalamin (VITAMIN B12) 1000 MCG TBCR Take 1 tablet by mouth every morning.   diltiazem (CARDIZEM CD) 240 MG 24 hr capsule Take 1 capsule (240 mg total) by mouth daily.   DULoxetine (CYMBALTA) 30 MG capsule Take 30 mg by mouth 2 (two) times daily.   ELIQUIS 5 MG TABS tablet Take 1 tablet (5 mg total) by  mouth 2 (two) times daily.   fexofenadine (ALLEGRA) 180 MG tablet Take 180 mg by mouth daily.   iron polysaccharides (NIFEREX) 150 MG capsule Take 150 mg by mouth every other day.   metoprolol tartrate (LOPRESSOR) 50 MG tablet Take 1 tablet (50 mg total) by mouth 2 (two) times daily.   Multiple Vitamins-Minerals (VISION FORMULA PO) Take 1 tablet by mouth every morning.   Omega-3 Fatty Acids (OMEGA-3 FISH OIL PO) Take 1,000 mg by mouth every morning.   pantoprazole (PROTONIX) 40 MG tablet Take 40 mg by mouth 2 (two) times daily.   Probiotic Product ([place]) CAPS Take 1 capsule by mouth every morning.   traMADol (ULTRAM) 50 MG tablet Take 50 mg by mouth every 6 (six) hours as needed for moderate pain or severe pain.   Turmeric (QC TUMERIC COMPLEX PO) Take 1 tablet by mouth daily. Unknown strenght     Allergies:   Escitalopram   Social History   Socioeconomic History   Marital status: Widowed    Spouse name: Not on file   Number of children: Not on file   Years of education: Not on file   Highest education level: Not on file  Occupational History   Not on file  Tobacco Use   Smoking status: Never   Smokeless tobacco: Never  Substance and Sexual Activity   Alcohol use: Not on file   Drug use: Not on file   Sexual activity: Not on file  Other Topics Concern   Not on file  Social History Narrative   Not on file   Social Determinants of Health   Financial Resource Strain: Not on file  Food Insecurity: Not on file  Transportation Needs: Not on file  Physical Activity: Not on file  Stress: Not on file  Social Connections: Not on file     Family History: The patient's Family history is unknown by patient.  ROS:   Please see the history of present illness.    (+) Fatigue (+) Shortness of breath (+) Epistaxis (+) Hematuria All other systems reviewed and are negative.  EKGs/Labs/Other Studies Reviewed:    The following studies were reviewed  today:  Monitor 03/2021: Patch Wear Time:  8 days and 11 hours (2022-12-05T09:05:20-0500 to 2022-12-13T20:45:30-0500)   Atrial Fibrillation occurred continuously (100% burden), ranging from 60-183 bpm (avg of 95 bpm). Isolated VEs were rare (<1.0%), and no VE Couplets or VE Triplets were present.   Summary conclusion: Atrial fibrillation with average heart rate of 95 bpm  Echo 02/13/2021: Conclusions: Overall left ventricular systolic function is normal with an EF between 60-65%. Left atrium is severely dilated by volume. The right atrium is moderately enlarged. There is mild aortic stenosis present. Peak/mean gradient across the valve is Mild mitral regurgitation is present. Mild tricuspid regurgitation present. The right ventricular systolic pressure as measured by Doppler is 37 mmhg.  Lexiscan Myoview 01/18/2021:    The study is normal. The study is low  risk.   No ST deviation was noted.   LV perfusion is normal.   Left ventricular function is normal. Nuclear stress EF: 58 %. The left ventricular ejection fraction is normal (55-65%).   Prior study available for comparison from 10/07/2002.   EKG:   EKG is personally reviewed.  06/13/2021: Atrial fibrillation, right bundle branch block, ventricular rate 88 bpm   Recent Labs: 12/23/2020: Hemoglobin 13.6; Platelets 263 05/10/2021: BUN 17; Creatinine, Ser 0.97; Magnesium 2.2; Potassium 4.2; Sodium 139   Recent Lipid Panel No results found for: CHOL, TRIG, HDL, CHOLHDL, VLDL, LDLCALC, LDLDIRECT  Physical Exam:    VS:  BP 132/64    Pulse 88    Ht 5\' 6"  (1.676 m)    Wt 138 lb (62.6 kg)    LMP  (LMP Unknown)    SpO2 97%    BMI 22.27 kg/m     Wt Readings from Last 3 Encounters:  06/13/21 138 lb (62.6 kg)  05/10/21 137 lb (62.1 kg)  04/03/21 131 lb (59.4 kg)     GEN: Well nourished, well developed in no acute distress HEENT: Normal NECK: No JVD; No carotid bruits LYMPHATICS: No lymphadenopathy CARDIAC: Irregularly irregular,  no murmurs, rubs, gallops RESPIRATORY:  Clear to auscultation without rales, wheezing or rhonchi  ABDOMEN: Soft, non-tender, non-distended MUSCULOSKELETAL:  No edema; No deformity  SKIN: Warm and dry NEUROLOGIC:  Alert and oriented x 3 PSYCHIATRIC:  Normal affect       ASSESSMENT:    1. Pre-op evaluation   2. Persistent atrial fibrillation (HCC)   3. Epistaxis    PLAN:    In order of problems listed above:  #Persistent atrial fibrillation #Epistaxis  Patient has symptomatic atrial fibrillation with fatigue.  It is not exactly clear to me how long she has been in atrial fibrillation.  She has previously failed cardioversion without an antiarrhythmic on board.  I discussed antiarrhythmic options with the patient including dofetilide and amiodarone.  She was previously scheduled for Tikosyn admission but ended up canceling because of concern of the proarrhythmic effects.  I discussed amiodarone during today's visit and she is interested in proceeding with amiodarone loading followed by cardioversion trial.  We discussed the possibility that the cardioversion would be unsuccessful.  If this occurs she will be deemed permanent atrial fibrillation.  We also discussed her risk of stroke during today's visit.  She has had trouble with epistaxis and hematuria while taking Eliquis.  She is interested in a long-term stroke mitigation strategy that avoids long-term exposure anticoagulation which I think is very reasonable.  We discussed the Watchman procedure in detail during today's visit including the risks, recovery and likelihood of success.  We also discussed the need for short-term anticoagulation.  She would like to proceed with scheduling.  Start amiodarone 400 mg daily for 10 days, then 200 mg daily. Cardioversion in 6 weeks Tentatively planning for Watchman in 9-10 weeks  ---------------------------------------------  I have seen Tahra A Weilbacher in the office today who is being  considered for a Watchman left atrial appendage closure device. I believe they will benefit from this procedure given their history of atrial fibrillation, CHA2DS2-VASc score of 4 and unadjusted ischemic stroke rate of 4.8% per year. Unfortunately, the patient is not felt to be a long term anticoagulation candidate secondary to history of epistaxis and hematuria. The patient's chart has been reviewed and I feel that they would be a candidate for short term oral anticoagulation after Watchman implant.   It  is my belief that after undergoing a LAA closure procedure, Ahjanae A Racette will not need long term anticoagulation which eliminates anticoagulation side effects and major bleeding risk.   Procedural risks for the Watchman implant have been reviewed with the patient including a 0.5% risk of stroke, <1% risk of perforation and <1% risk of device embolization.    The published clinical data on the safety and effectiveness of WATCHMAN include but are not limited to the following: - Holmes DR, Mechele Claude, Sick P et al. for the PROTECT AF Investigators. Percutaneous closure of the left atrial appendage versus warfarin therapy for prevention of stroke in patients with atrial fibrillation: a randomised non-inferiority trial. Lancet 2009; 374: 534-42. Mechele Claude, Doshi SK, Abelardo Diesel D et al. on behalf of the PROTECT AF Investigators. Percutaneous Left Atrial Appendage Closure for Stroke Prophylaxis in Patients With Atrial Fibrillation 2.3-Year Follow-up of the PROTECT AF (Watchman Left Atrial Appendage System for Embolic Protection in Patients With Atrial Fibrillation) Trial. Circulation 2013; 127:720-729. - Alli O, Doshi S,  Kar S, Reddy VY, Sievert H et al. Quality of Life Assessment in the Randomized PROTECT AF (Percutaneous Closure of the Left Atrial Appendage Versus Warfarin Therapy for Prevention of Stroke in Patients With Atrial Fibrillation) Trial of Patients at Risk for Stroke With Nonvalvular Atrial  Fibrillation. J Am Coll Cardiol 2013; N8865744. Vertell Limber DR, Tarri Abernethy, Price M, [place], Sievert H, Doshi S, Huber K, Reddy V. Prospective randomized evaluation of the Watchman left atrial appendage Device in patients with atrial fibrillation versus long-term warfarin therapy; the PREVAIL trial. Journal of the [employer] of Cardiology, Vol. 4, No. 1, 2014, 1-11. - Kar S, Doshi SK, Sadhu A, Horton R, Osorio J et al. Primary outcome evaluation of a next-generation left atrial appendage closure device: results from the PINNACLE FLX trial. Circulation 2021;143(18)1754-1762.    After today's visit with the patient which was dedicated solely for shared decision making visit regarding LAA closure device, the patient decided to proceed with the LAA appendage closure procedure scheduled to be done in the near future at [facility]. Prior to the procedure, I would like to obtain a gated CT scan of the chest with contrast timed for PV/LA visualization.     HAS-BLED score 3 Hypertension Yes  Abnormal renal and liver function (Dialysis, transplant, Cr >2.26 mg/dL /Cirrhosis or Bilirubin >2x Normal or AST/ALT/AP >3x Normal) No  Stroke No  Bleeding Yes  Labile INR (Unstable/high INR) No  Elderly (>65) Yes  Drugs or alcohol (? 8 drinks/week, anti-plt or NSAID) No   CHA2DS2-VASc Score = 4  The patient's score is based upon: CHF History: 0 HTN History: 1 Diabetes History: 0 Stroke History: 0 Vascular Disease History: 0 Age Score: 2 Gender Score: 1    Total time spent with patient today 60 minutes. This includes reviewing records, evaluating the patient and coordinating care.  Medication Adjustments/Labs and Tests Ordered: Current medicines are reviewed at length with the patient today.  Concerns regarding medicines are outlined above.  Orders Placed This Encounter  Procedures   CT CARDIAC MORPH/PULM VEIN W/CM&W/O CA SCORE   Basic Metabolic Panel (BMET)   EKG 12-Lead   Meds  ordered this encounter  Medications   amiodarone (PACERONE) 200 MG tablet    Sig: 400 mg (2 tablets) daily for 10 days, then 200 mg (1 tablet) daily from there    Dispense:  90 tablet    Refill:  3  I,Mathew Stumpf,acting as a Education administrator for Vickie Epley, MD.,have documented all relevant documentation on the behalf of Vickie Epley, MD,as directed by  Vickie Epley, MD while in the presence of Vickie Epley, MD.  I, Vickie Epley, MD, have reviewed all documentation for this visit. The documentation on 06/13/21 for the exam, diagnosis, procedures, and orders are all accurate and complete.   Signed, Hilton Cork. Quentin Ore, MD, [facility], [facility] 06/13/2021 8:33 PM    Electrophysiology  [facility]

## 2021-06-13 NOTE — Patient Instructions (Addendum)
Medication Instructions:  Start Amiodarone 400 mg daily for 10 days then 200 mg daily there after.  Your physician recommends that you continue on your current medications as directed. Please refer to the Current Medication list given to you today. *If you need a refill on your cardiac medications before your next appointment, please call your pharmacy*  Lab Work: None. If you have labs (blood work) drawn today and your tests are completely normal, you will receive your results only by: [place] (if you have MyChart) OR A paper copy in the mail If you have any lab test that is abnormal or we need to change your treatment, we will call you to review the results.  Testing/Procedures: Your physician has recommended that you have a Cardioversion (DCCV). Electrical Cardioversion uses a jolt of electricity to your heart either through paddles or wired patches attached to your chest. This is a controlled, usually prescheduled, procedure. Defibrillation is done under light anesthesia in the hospital, and you usually go home the day of the procedure. This is done to get your heart back into a normal rhythm. You are not awake for the procedure. Please see the instruction sheet given to you today.   Follow-Up: At [facility], you and your health needs are our priority.  As part of our continuing mission to provide you with exceptional heart care, we have created designated Provider Care Teams.  These Care Teams include your primary Cardiologist (physician) and Advanced Practice Providers (APPs -  Physician Assistants and Nurse Practitioners) who all work together to provide you with the care you need, when you need it.  Your physician wants you to follow-up in: Lenice Llamas, the [facility] Nurse Navigator, will call you after your CT once the [facility] Team has reviewed your imaging for an update on proceedings. [place]'s direct number is [phone] if you need assistance.  We recommend signing up for  the patient portal called "MyChart".  Sign up information is provided on this After Visit Summary.  MyChart is used to connect with patients for Virtual Visits (Telemedicine).  Patients are able to view lab/test results, encounter notes, upcoming appointments, etc.  Non-urgent messages can be sent to your provider as well.   To learn more about what you can do with MyChart, go to [email or web address].    Any Other Special Instructions Will Be Listed Below (If Applicable).  Left Atrial Appendage Closure Device Implantation Left atrial appendage (LAA) closure device implantation is a procedure to put a small device in the LAA of the heart. The LAA is a small sac in the wall of the heart's left upper chamber. Blood clots can form in the LAA in people with atrial fibrillation (AFib). The device closes the LAA to help prevent a blood clot and stroke. AFib is a type of irregular or rapid heartbeat (arrhythmia). There is an increased risk of blood clots and stroke with AFib. This procedure helps to reduce that risk. Tell a health care provider about: Any allergies you have. All medicines you are taking, including vitamins, herbs, eye drops, creams, and over-the-counter medicines. Any problems you or family members have had with anesthetic medicines. Any blood disorders you have. Any surgeries you have had. Any medical conditions you have. Whether you are pregnant or may be pregnant. What are the risks? Generally, this is a safe procedure. However, problems may occur, including: Infection. Bleeding. Allergic reactions to medicines or dyes. Damage to nearby structures or organs. Heart attack. Stroke. Blood clots. Changes in heart  rhythm. Device failure. What happens before the procedure? Staying hydrated Follow instructions from your health care provider about hydration, which may include: Up to 2 hours before the procedure - you may continue to drink clear liquids, such as water, clear fruit  juice, black coffee, and plain tea. Eating and drinking restrictions Follow instructions from your health care provider about eating and drinking, which may include: 8 hours before the procedure - stop eating heavy meals or foods, such as meat, fried foods, or fatty foods. 6 hours before the procedure - stop eating light meals or foods, such as toast or cereal. 6 hours before the procedure - stop drinking milk or drinks that contain milk. 2 hours before the procedure - stop drinking clear liquids. Medicines Ask your health care provider about: Changing or stopping your regular medicines. This is especially important if you are taking diabetes medicines or blood thinners. Taking medicines such as aspirin and ibuprofen. These medicines can thin your blood. Do not take these medicines unless your health care provider tells you to take them. Taking over-the-counter medicines, vitamins, herbs, and supplements. Tests You may have blood tests and a physical exam. You may have an electrocardiogram (ECG). This test checks your heart's electrical patterns and rhythms. General instructions Do not use any products that contain nicotine or tobacco. These include cigarettes, chewing tobacco, and vaping devices, such as e-cigarettes. If you need help quitting, ask your health care provider. Ask your health care provider what steps will be taken to help prevent infection. These steps may include: Removing hair at the surgery site. Washing skin with a germ-killing soap. Taking antibiotic medicine. Plan to have a responsible adult take you home from the hospital or clinic. Plan to have a responsible adult care for you for the time you are told after you leave the hospital or clinic. This is important. What happens during the procedure? An IV will be inserted into one of your veins. You will be given one or more of the following: A medicine to help you relax (sedative). A medicine to make you fall asleep  (general anesthetic). A small incision will be made in your groin area. A small wire will be put through the incision and into a blood vessel. Dye may be injected so X-rays can be used to guide the wire through the blood vessel. A long, thin tube (catheter) will be put over the small wire and moved up through the blood vessel to reach your heart. The closure device will be moved through the catheter until it reaches your heart. A small hole will be made in the septum (transseptal puncture). The septum is a thin tissue that separates the upper two chambers of the heart. The device will be placed so that it closes the LAA. X-rays will be done to make sure the device is in the right place. The catheter and wire will be removed. The closure device will remain in your heart. After pressure is applied over the catheter site to prevent bleeding, a bandage (dressing) will be placed over the site where the catheter was inserted. The procedure may vary among health care providers and hospitals. What happens after the procedure? Your blood pressure, heart rate, breathing rate, and blood oxygen level will be monitored until you leave the hospital or clinic. You may have to wear compression stockings. These stockings help to prevent blood clots and reduce swelling in your legs. If you were given a sedative during the procedure, it can affect you for  several hours. Do not drive or operate machinery until your health care provider says it is safe. You may be given pain medicine. You may need to drink more fluids to wash (flush) the dye out of your body. Drink enough fluid to keep your urine pale yellow. Take over-the-counter and prescription medicines only as told by your health care provider. This is especially important if you were given blood thinners. Summary Left atrial appendage (LAA) closure device implantation is a procedure that is done to put a small device in the LAA of the heart. The LAA is a small  sac in the wall of the heart's left upper chamber. The device closes the LAA to prevent stroke and other problems. Follow instructions from your health care provider before and after the procedure. This information is not intended to replace advice given to you by your health care provider. Make sure you discuss any questions you have with your health care provider. Document Revised: 12/24/2019 Document Reviewed: 12/24/2019 Elsevier Patient Education  [place].

## 2021-06-14 LAB — BASIC METABOLIC PANEL
BUN/Creatinine Ratio: 15 (ref 12–28)
BUN: 14 mg/dL (ref 8–27)
CO2: 31 mmol/L — ABNORMAL HIGH (ref 20–29)
Calcium: 10 mg/dL (ref 8.7–10.3)
Chloride: 100 mmol/L (ref 96–106)
Creatinine, Ser: 0.91 mg/dL (ref 0.57–1.00)
Glucose: 109 mg/dL — ABNORMAL HIGH (ref 70–99)
Potassium: 4.2 mmol/L (ref 3.5–5.2)
Sodium: 141 mmol/L (ref 134–144)
eGFR: 64 mL/min/{1.73_m2} (ref >59)

## 2021-06-27 ENCOUNTER — Other Ambulatory Visit: Payer: Self-pay

## 2021-06-27 ENCOUNTER — Ambulatory Visit (INDEPENDENT_AMBULATORY_CARE_PROVIDER_SITE_OTHER): Payer: Medicare Other | Admitting: Cardiology

## 2021-06-27 ENCOUNTER — Encounter: Payer: Self-pay | Admitting: Cardiology

## 2021-06-27 VITALS — BP 140/80 | HR 79 | Ht 66.0 in | Wt 136.8 lb

## 2021-06-27 DIAGNOSIS — I4819 Other persistent atrial fibrillation: Secondary | ICD-10-CM | POA: Diagnosis not present

## 2021-06-27 DIAGNOSIS — I10 Essential (primary) hypertension: Secondary | ICD-10-CM | POA: Diagnosis not present

## 2021-06-27 DIAGNOSIS — D6869 Other thrombophilia: Secondary | ICD-10-CM

## 2021-06-27 DIAGNOSIS — I358 Other nonrheumatic aortic valve disorders: Secondary | ICD-10-CM | POA: Diagnosis not present

## 2021-06-27 NOTE — Progress Notes (Signed)
g

## 2021-06-27 NOTE — Patient Instructions (Signed)
Medication Instructions:  Your physician recommends that you continue on your current medications as directed. Please refer to the Current Medication list given to you today.  *If you need a refill on your cardiac medications before your next appointment, please call your pharmacy*   Lab Work: None If you have labs (blood work) drawn today and your tests are completely normal, you will receive your results only by: MyChart Message (if you have MyChart) OR A paper copy in the mail If you have any lab test that is abnormal or we need to change your treatment, we will call you to review the results.   Testing/Procedures: None   Follow-Up: At [facility], you and your health needs are our priority.  As part of our continuing mission to provide you with exceptional heart care, we have created designated Provider Care Teams.  These Care Teams include your primary Cardiologist (physician) and Advanced Practice Providers (APPs -  Physician Assistants and Nurse Practitioners) who all work together to provide you with the care you need, when you need it.  We recommend signing up for the patient portal called "MyChart".  Sign up information is provided on this After Visit Summary.  MyChart is used to connect with patients for Virtual Visits (Telemedicine).  Patients are able to view lab/test results, encounter notes, upcoming appointments, etc.  Non-urgent messages can be sent to your provider as well.   To learn more about what you can do with MyChart, go to [email or web address].    Your next appointment:   3 month(s)  The format for your next appointment:   In Person  Provider:   Gypsy Balsam, MD    Other Instructions Please pick up amiodarone and take as prescribed.

## 2021-06-27 NOTE — Progress Notes (Addendum)
Cardiology Office Note:    Date:  07/07/2021   ID:  [name], DOB 03-05-1941, MRN [id number]  PCP:  Algis Greenhouse, MD  Cardiologist: Dr. Agustin Cree, MD  Referring MD: Algis Greenhouse, MD   Chief Complaint  Patient presents with   Follow-up  I am  weak tired and exhausted he also have some shortness of breath  History of Present Illness:    Maria Navarro is a 81 y.o. female with past medical history significant for persistent atrial fibrillation, she did have 1 failed cardioversion.  Also Mild aortic stenosis, essential hypertension, diffuse arthritis.  She comes today to my office to discuss her issues.  She had been followed by our EP team.  She was offered to start dofetilide however did not do it because of concern about poor arrhythmia, she agreed to start amiodarone however she did not get prescription she tell me the pharmacy did not have it however yesterday she received a phone call from pharmacy and the prescription is available so she is intended to take it.  She is being anticoagulated with her CHADS2 Vascor equals 4.  However she does have history of hematuria as well as epistaxis and he wished not to continue with anticoagulation therefore Watchman device has been planned.  Overall she denies having any palpitation just profound weakness fatigue tiredness.  She does have diffuse arthritis which bothers her a lot.  There is no dizziness no passing out no palpitations no chest pain tightness squeezing pressure burning chest.  Past Medical History:  Diagnosis Date   Allergic rhinitis 08/10/2015   Anemia 11/15/2020   Formatting of this note might be different from the original. 10/2020: incidental HGB 11.1, MCV 96, RDW 14   Aortic valve sclerosis 05/14/2019   Formatting of this note might be different from the original. 06/02/2020: ECHO, mild   Atrial fibrillation ([place]) 10/26/2020   Formatting of this note might be different from the original. 10/26/2020: noted pre-op by anesthesia,  rate 118, to ED   Benign hypertension 08/18/2015   Chronic bilateral low back pain without sciatica 05/30/2020   Family history of premature coronary artery disease 04/14/2018   GERD (gastroesophageal reflux disease) 08/10/2015   Lumbar disc disease 05/30/2020   Mixed hyperlipidemia 10/03/2015   2018: declined rx 2019: declinded rx   Osteoarthritis 08/10/2015   Osteoporosis 10/03/2015   2008: -1.5 2017: -2.5 hip  Formatting of this note might be different from the original. 2008: -1.5 2017: -2.5 hip 2021: -2.6   Pain syndrome, chronic 08/10/2015   (2004) DX/RX: NSAID (2006) DX: NSAID Gastric ulcer with esoph stricture  (2006) RX: tramadol   Plantar fasciitis 08/18/2016   Recurrent major depressive disorder, in remission ([place]) 08/10/2015   Screening for diabetes mellitus (DM) 10/03/2015    Past Surgical History:  Procedure Laterality Date   APPENDECTOMY     CARDIOVERSION  12/29/2020   CHOLECYSTECTOMY     FOOT SURGERY     Hernia repaired      Current Medications: Current Meds  Medication Sig   acetaminophen (TYLENOL) 500 MG tablet Take 500 mg by mouth as needed.   amiodarone (PACERONE) 200 MG tablet 400 mg (2 tablets) daily for 10 days, then 200 mg (1 tablet) daily from there (Patient taking differently: Take 200 mg by mouth daily. 400 mg (2 tablets) daily for 10 days, then 200 mg (1 tablet) daily from there)   Boswellia-Glucosamine-Vit D (OSTEO BI-FLEX ONE PER DAY PO) Take 2 tablets by  mouth every morning.   buPROPion (WELLBUTRIN XL) 300 MG 24 hr tablet Take 300 mg by mouth daily.   Casanthranol-Docusate Sodium (LAXATIVE PLUS STOOL SOFTENER PO) Take 2 tablets by mouth every morning.   Cholecalciferol (VITAMIN D3) 50 MCG (2000 UT) TABS Take 1 tablet by mouth every morning.   Cyanocobalamin (VITAMIN B12) 1000 MCG TBCR Take 1 tablet by mouth every morning.   diltiazem (CARDIZEM CD) 240 MG 24 hr capsule Take 1 capsule (240 mg total) by mouth daily.   DULoxetine (CYMBALTA) 30 MG capsule Take 30 mg  by mouth 2 (two) times daily.   ELIQUIS 5 MG TABS tablet Take 1 tablet (5 mg total) by mouth 2 (two) times daily.   fexofenadine (ALLEGRA) 180 MG tablet Take 180 mg by mouth daily.   iron polysaccharides (NIFEREX) 150 MG capsule Take 150 mg by mouth every other day.   metoprolol tartrate (LOPRESSOR) 50 MG tablet Take 1 tablet (50 mg total) by mouth 2 (two) times daily.   Multiple Vitamins-Minerals (VISION FORMULA PO) Take 1 tablet by mouth every morning.   Omega-3 Fatty Acids (OMEGA-3 FISH OIL PO) Take 1,000 mg by mouth every morning.   pantoprazole (PROTONIX) 40 MG tablet Take 40 mg by mouth 2 (two) times daily.   Probiotic Product ([place]) CAPS Take 1 capsule by mouth every morning.   traMADol (ULTRAM) 50 MG tablet Take 50 mg by mouth every 6 (six) hours as needed for moderate pain or severe pain.   Turmeric (QC TUMERIC COMPLEX PO) Take 1 tablet by mouth daily. Unknown strenght     Allergies:   Escitalopram   Social History   Socioeconomic History   Marital status: Widowed    Spouse name: Not on file   Number of children: Not on file   Years of education: Not on file   Highest education level: Not on file  Occupational History   Not on file  Tobacco Use   Smoking status: Never   Smokeless tobacco: Never  Substance and Sexual Activity   Alcohol use: Not on file   Drug use: Not on file   Sexual activity: Not on file  Other Topics Concern   Not on file  Social History Narrative   Not on file   Social Determinants of Health   Financial Resource Strain: Not on file  Food Insecurity: Not on file  Transportation Needs: Not on file  Physical Activity: Not on file  Stress: Not on file  Social Connections: Not on file     Family History: The patient's Family history is unknown by patient. ROS:   Please see the history of present illness.    All 14 point review of systems negative except as described per history of present illness  EKGs/Labs/Other Studies  Reviewed:      Recent Labs: 12/23/2020: Hemoglobin 13.6; Platelets 263 05/10/2021: Magnesium 2.2 06/13/2021: BUN 14; Creatinine, Ser 0.91; Potassium 4.2; Sodium 141  Recent Lipid Panel No results found for: CHOL, TRIG, HDL, CHOLHDL, VLDL, LDLCALC, LDLDIRECT  Physical Exam:    VS:  BP 140/80 (BP Location: Right Arm, Patient Position: Sitting)    Pulse 79    Ht 5\' 6"  (1.676 m)    Wt 136 lb 12.8 oz (62.1 kg)    LMP  (LMP Unknown)    SpO2 96%    BMI 22.08 kg/m     Wt Readings from Last 3 Encounters:  06/27/21 136 lb 12.8 oz (62.1 kg)  06/13/21 138 lb (62.6  kg)  05/10/21 137 lb (62.1 kg)     GEN:  Well nourished, well developed in no acute distress HEENT: Normal NECK: No JVD; No carotid bruits LYMPHATICS: No lymphadenopathy CARDIAC: Irregularly irregular, no murmurs, no rubs, no gallops RESPIRATORY:  Clear to auscultation without rales, wheezing or rhonchi  ABDOMEN: Soft, non-tender, non-distended MUSCULOSKELETAL:  No edema; No deformity  SKIN: Warm and dry LOWER EXTREMITIES: no swelling NEUROLOGIC:  Alert and oriented x 3 PSYCHIATRIC:  Normal affect   ASSESSMENT:    1. Persistent atrial fibrillation ([place])   2. Benign hypertension   3. Aortic valve sclerosis   4. Acquired thrombophilia ([place])    PLAN:    In order of problems listed above:  Persistent atrial fibrillation the plan is to initiate amiodarone therapy 10 months from now she is scheduled to have electrical cardioversion.  Hopefully will be able to get her back to normal rhythm.  Also because of concern about bleeding hematuria and epistaxis decision has been made to pursue a Watchman device.  I did discuss in detail with her she understands and she is willing to pursue.  Rate is controlled, continue anticoagulation for now. Essential hypertension blood pressure well controlled continue present management. Aortic stenosis only mild based on last echocardiogram.  We will continue monitoring. Acquired thrombophilia  related to Eliquis.  Again the plan is to do Watchman device then will be able to stop the medication.  [facility] Referral for Left Atrial Appendage Closure with Non-Valvular Atrial Fibrillation   Nevea A Prioleau is a 81 y.o. female is being referred to the [facility] Team for evaluation for Left Atrial Appendage Closure with Watchman device for the management of stroke risk resulting form non-valvular atrial fibrillation.    Base upon Ms. Tussey's history, she is felt to be a poor candidate for long-term anticoagulation because of a history of bleeding (e.g. intracerebral, subdural, GI, retro-peritoneal).  The patient has a HAS-BLED score of 2 indicating a Yearly Major Bleeding Risk of 1.88%.      Her CHADS2-VASc Score is 4 with an unadjusted Ischemic Stroke Rate (% per year) of 4.8%.    Her stroke risk necessitates a strategy of stroke prevention with either long-term oral anticoagulation or left atrial appendage occlusion therapy. We have discussed their bleeding risk in the context of their comorbid medical problems, as well as the rationale for referral for evaluation of Watchman left atrial appendage occlusion therapy. While the patient is at high long-term bleeding risk, they may be appropriate for short-term anticoagulation. Based on this individual patient's stroke and bleeding risk, a shared decision has been made to refer the patient for consideration of Watchman left atrial appendage closure utilizing the [employer] of Cardiology shared decision tool.  Medication Adjustments/Labs and Tests Ordered: Current medicines are reviewed at length with the patient today.  Concerns regarding medicines are outlined above.  Orders Placed This Encounter  Procedures   EKG 12-Lead   Medication changes: No orders of the defined types were placed in this encounter.   Signed, Park Liter, MD, [facility] 07/07/2021 11:13 AM    [place]

## 2021-07-17 ENCOUNTER — Encounter (HOSPITAL_COMMUNITY): Payer: Self-pay | Admitting: Internal Medicine

## 2021-07-17 ENCOUNTER — Telehealth (HOSPITAL_COMMUNITY): Payer: Self-pay | Admitting: *Deleted

## 2021-07-17 NOTE — Telephone Encounter (Signed)
Reaching out to patient to offer assistance regarding upcoming cardiac imaging study; pt verbalizes understanding of appt date/time, parking situation and where to check in, pre-test NPO status, and verified current allergies; name and call back number provided for further questions should they arise ? ?Gordy Clement RN Navigator Cardiac Imaging ?[facility] ?[phone] office ?[phone] cell ? ?Patient aware to arrive at 1pm for her 1:30pm cardiac CT scan.  ?

## 2021-07-18 ENCOUNTER — Ambulatory Visit (HOSPITAL_COMMUNITY)
Admission: RE | Admit: 2021-07-18 | Discharge: 2021-07-18 | Disposition: A | Discharge status: Home / Self Care | Payer: Medicare Other | Source: Ambulatory Visit | Attending: Cardiology | Admitting: Cardiology

## 2021-07-18 ENCOUNTER — Other Ambulatory Visit: Payer: Self-pay

## 2021-07-18 DIAGNOSIS — Z01818 Encounter for other preprocedural examination: Secondary | ICD-10-CM | POA: Diagnosis present

## 2021-07-18 DIAGNOSIS — I4819 Other persistent atrial fibrillation: Secondary | ICD-10-CM | POA: Diagnosis present

## 2021-07-18 MED ORDER — IOHEXOL 350 MG/ML SOLN
95.0000 mL | Freq: Once | INTRAVENOUS | Status: AC | PRN
  Administered 2021-07-18: 95 mL via INTRAVENOUS

## 2021-07-19 ENCOUNTER — Telehealth: Payer: Self-pay | Admitting: Cardiology

## 2021-07-19 ENCOUNTER — Telehealth: Payer: Self-pay

## 2021-07-19 NOTE — Telephone Encounter (Signed)
See additional phone note. 

## 2021-07-19 NOTE — Telephone Encounter (Signed)
[facility] Radiology called with call report  ?

## 2021-07-19 NOTE — Telephone Encounter (Signed)
[facility]iology called with the following report: ? ?Multiple bilateral pulmonary nodules, largest measuring up to 8 ?mm in the right lower lobe. Non-contrast chest CT at 3-6 months is ?recommended. If the nodules are stable at time of repeat CT, then ?future CT at 18-24 months (from today's scan) is considered optional ?for low-risk patients, but is recommended for high-risk patients. ?This recommendation follows the consensus statement: Guidelines for ?Management of Incidental Pulmonary Nodules Detected on CT Images: ?From the Fleischner Society 2017; Radiology 2017; 284:228-243. ?

## 2021-07-25 ENCOUNTER — Encounter (HOSPITAL_COMMUNITY): Payer: Self-pay | Admitting: Internal Medicine

## 2021-07-25 ENCOUNTER — Other Ambulatory Visit: Payer: Self-pay

## 2021-07-25 ENCOUNTER — Ambulatory Visit (HOSPITAL_BASED_OUTPATIENT_CLINIC_OR_DEPARTMENT_OTHER)
Admission: RE | Admit: 2021-07-25 | Discharge: 2021-07-25 | Disposition: A | Discharge status: Home / Self Care | Payer: Medicare Other | Source: Home / Self Care | Attending: Internal Medicine | Admitting: Internal Medicine

## 2021-07-25 ENCOUNTER — Ambulatory Visit (HOSPITAL_COMMUNITY): Payer: Medicare Other | Admitting: Anesthesiology

## 2021-07-25 ENCOUNTER — Ambulatory Visit (HOSPITAL_BASED_OUTPATIENT_CLINIC_OR_DEPARTMENT_OTHER): Payer: Medicare Other | Admitting: Anesthesiology

## 2021-07-25 ENCOUNTER — Encounter (HOSPITAL_COMMUNITY)
Admission: RE | Disposition: A | Discharge status: Home / Self Care | Payer: Self-pay | Source: Home / Self Care | Attending: Internal Medicine

## 2021-07-25 DIAGNOSIS — I340 Nonrheumatic mitral (valve) insufficiency: Secondary | ICD-10-CM | POA: Insufficient documentation

## 2021-07-25 DIAGNOSIS — M1990 Unspecified osteoarthritis, unspecified site: Secondary | ICD-10-CM | POA: Insufficient documentation

## 2021-07-25 DIAGNOSIS — I4819 Other persistent atrial fibrillation: Secondary | ICD-10-CM | POA: Insufficient documentation

## 2021-07-25 DIAGNOSIS — I4891 Unspecified atrial fibrillation: Secondary | ICD-10-CM | POA: Diagnosis not present

## 2021-07-25 DIAGNOSIS — D649 Anemia, unspecified: Secondary | ICD-10-CM | POA: Insufficient documentation

## 2021-07-25 DIAGNOSIS — F32A Depression, unspecified: Secondary | ICD-10-CM | POA: Insufficient documentation

## 2021-07-25 DIAGNOSIS — D6859 Other primary thrombophilia: Secondary | ICD-10-CM | POA: Insufficient documentation

## 2021-07-25 DIAGNOSIS — I10 Essential (primary) hypertension: Secondary | ICD-10-CM | POA: Insufficient documentation

## 2021-07-25 DIAGNOSIS — K219 Gastro-esophageal reflux disease without esophagitis: Secondary | ICD-10-CM | POA: Insufficient documentation

## 2021-07-25 DIAGNOSIS — D759 Disease of blood and blood-forming organs, unspecified: Secondary | ICD-10-CM | POA: Insufficient documentation

## 2021-07-25 DIAGNOSIS — I350 Nonrheumatic aortic (valve) stenosis: Secondary | ICD-10-CM | POA: Insufficient documentation

## 2021-07-25 DIAGNOSIS — I110 Hypertensive heart disease with heart failure: Secondary | ICD-10-CM | POA: Diagnosis not present

## 2021-07-25 DIAGNOSIS — J810 Acute pulmonary edema: Secondary | ICD-10-CM | POA: Diagnosis not present

## 2021-07-25 HISTORY — PX: CARDIOVERSION: SHX1299

## 2021-07-25 SURGERY — CARDIOVERSION
Anesthesia: General

## 2021-07-25 MED ORDER — PROPOFOL 10 MG/ML IV BOLUS
INTRAVENOUS | Status: DC | PRN
  Administered 2021-07-25: 60 mg via INTRAVENOUS

## 2021-07-25 MED ORDER — LIDOCAINE 2% (20 MG/ML) 5 ML SYRINGE
INTRAMUSCULAR | Status: DC | PRN
  Administered 2021-07-25: 60 mg via INTRAVENOUS

## 2021-07-25 MED ORDER — SODIUM CHLORIDE 0.9 % IV SOLN: INTRAVENOUS | Status: DC

## 2021-07-25 NOTE — Anesthesia Postprocedure Evaluation (Signed)
Anesthesia Post Note ? ?Patient: Maria Navarro ? ?Procedure(s) Performed: CARDIOVERSION ? ?  ? ?Patient location during evaluation: Endoscopy ?Anesthesia Type: General ?Level of consciousness: sedated and patient cooperative ?Pain management: pain level controlled ?Vital Signs Assessment: post-procedure vital signs reviewed and stable ?Respiratory status: spontaneous breathing ?Cardiovascular status: stable ?Anesthetic complications: no ? ? ?No notable events documented. ? ?Last Vitals:  ?Vitals:  ? 07/25/21 0906  ?BP: (!) 173/86  ?Pulse: 96  ?Resp: 12  ?Temp: 36.5 ?C  ?  ?Last Pain:  ?Vitals:  ? 07/25/21 0906  ?PainSc: 0-No pain  ? ? ?  ?  ?  ?  ?  ?  ? ?Lewie Loron ? ? ? ? ?

## 2021-07-25 NOTE — Interval H&P Note (Signed)
History and Physical Interval Note: ? ?07/25/2021 ?9:49 AM ? ?Maria Navarro  has presented today for surgery, with the diagnosis of AFIB.  The various methods of treatment have been discussed with the patient and family. After consideration of risks, benefits and other options for treatment, the patient has consented to  Procedure(s): ?CARDIOVERSION (N/A) as a surgical intervention.  The patient's history has been reviewed, patient examined, no change in status, stable for surgery.  I have reviewed the patient's chart and labs.  Questions were answered to the patient's satisfaction.   ? ? ?Donneisha Beane A Skylen Spiering ? ? ?

## 2021-07-25 NOTE — Anesthesia Preprocedure Evaluation (Addendum)
Anesthesia Evaluation  ?Patient identified by MRN, date of birth, ID band ?Patient awake ? ? ? ?Reviewed: ?Allergy & Precautions, NPO status , Patient's Chart, lab work & pertinent test results ? ?Airway ?Mallampati: II ? ?TM Distance: >3 FB ?Neck ROM: Full ? ? ? Dental ? ?(+) Dental Advisory Given, Edentulous Upper, Missing ?  ?Pulmonary ?neg pulmonary ROS,  ?  ?Pulmonary exam normal ?breath sounds clear to auscultation ? ? ? ? ? ? Cardiovascular ?hypertension, Pt. on home beta blockers and Pt. on medications ?+ Valvular Problems/Murmurs AS and MR  ?Rhythm:Regular Rate:Normal ?+ Systolic murmurs ?Echo 10/2020 ? ??1. TDS. Pt tachycardic. Left ventricular ejection fraction, by estimation, is 45 to 50%. The left ventricle has mildly decreased function. The left ventricle has no regional wall motion abnormalities. Left ventricular diastolic parameters are indeterminate.  ??2. Right ventricular systolic function is normal. The right ventricular size is normal. There is normal pulmonary artery systolic pressure.  ??3. Left atrial size was moderately dilated.  ??4. Right atrial size was moderately dilated.  ??5. The mitral valve is normal in structure. Mild mitral valve  ?regurgitation. No evidence of mitral stenosis.  ??6. Tricuspid valve regurgitation is moderate.  ??7. TDS, possible low flow low gradient significant AS. SVI only 16! Study should be repeated for AS after HR will be better control. The aortic valve is normal in structure. There is moderate calcification of the aortic valve. There is moderate thickening of the aortic valve. Aortic valve regurgitation is not visualized. No aortic stenosis is present. Aortic valve area, by VTI measures 0.74 cm?Marland Kitchen Aortic valve mean gradient measures 8.0 mmHg.  ??8. The inferior vena cava is normal in size with greater than 50% respiratory variability, suggesting right atrial pressure of 3 mmHg.  ?  ?Neuro/Psych ?PSYCHIATRIC DISORDERS  Depression  Neuromuscular disease   ? GI/Hepatic ?Neg liver ROS, GERD  ,  ?Endo/Other  ?negative endocrine ROS ? Renal/GU ?negative Renal ROS  ? ?  ?Musculoskeletal ? ?(+) Arthritis ,  ? Abdominal ?  ?Peds ? Hematology ? ?(+) Blood dyscrasia, anemia ,   ?Anesthesia Other Findings ? ? Reproductive/Obstetrics ? ?  ? ? ? ? ? ? ? ? ? ? ? ? ? ?  ?  ? ? ? ? ? ? ? ?Anesthesia Physical ?Anesthesia Plan ? ?ASA: 3 ? ?Anesthesia Plan: General  ? ?Post-op Pain Management: Minimal or no pain anticipated  ? ?Induction: Intravenous ? ?PONV Risk Score and Plan: 3 and Treatment may vary due to age or medical condition, Propofol infusion and TIVA ? ?Airway Management Planned: Mask ? ?Additional Equipment:  ? ?Intra-op Plan:  ? ?Post-operative Plan:  ? ?Informed Consent: I have reviewed the patients History and Physical, chart, labs and discussed the procedure including the risks, benefits and alternatives for the proposed anesthesia with the patient or authorized representative who has indicated his/her understanding and acceptance.  ? ? ? ?Dental advisory given ? ?Plan Discussed with: CRNA ? ?Anesthesia Plan Comments:   ? ? ? ? ? ? ?Anesthesia Quick Evaluation ? ?

## 2021-07-25 NOTE — Discharge Instructions (Signed)

## 2021-07-25 NOTE — Transfer of Care (Signed)
Immediate Anesthesia Transfer of Care Note ? ?Patient: [name] ? ?Procedure(s) Performed: CARDIOVERSION ? ?Patient Location: Endoscopy Unit ? ?Anesthesia Type:General ? ?Level of Consciousness: drowsy and patient cooperative ? ?Airway & Oxygen Therapy: Patient Spontanous Breathing ? ?Post-op Assessment: Report given to RN and Post -op Vital signs reviewed and stable ? ?Post vital signs: Reviewed and stable ? ?Last Vitals:  ?Vitals Value Taken Time  ?BP    ?Temp    ?Pulse    ?Resp    ?SpO2    ? ? ?Last Pain:  ?Vitals:  ? 07/25/21 0906  ?PainSc: 0-No pain  ?   ? ?  ? ?Complications: No notable events documented. ?

## 2021-07-25 NOTE — CV Procedure (Signed)
? ?  Electrical Cardioversion Procedure Note ?Maria Navarro ?[id number] ?July 15, 1940 ? ?Procedure: Electrical Cardioversion ?Indications:  Atrial Fibrillation ? ?Time Out: Verified patient identification, verified procedure,medications/allergies/relevent history reviewed, required imaging and test results available.  Performed ? ?Procedure Details ? ?The patient was NPO after midnight. Anesthesia was administered at the beside  by Dr. Renold Don with 60 mg of lidocaine and 60 mg propofol.  Cardioversion was done with synchronized biphasic defibrillation with AP pads with 200 Joules.  The patient converted to normal sinus rhythm. The patient tolerated the procedure well  ? ?IMPRESSION: ? ?Successful cardioversion of atrial fibrillation ? ? ? ?Maria Navarro ?07/25/2021, 9:57 AM ? ? ?

## 2021-07-26 ENCOUNTER — Inpatient Hospital Stay (HOSPITAL_COMMUNITY)
Admission: EM | Admit: 2021-07-26 | Discharge: 2021-08-01 | DRG: 291 | Disposition: A | Discharge status: Home Health | Payer: Medicare Other | Attending: Internal Medicine | Admitting: Internal Medicine

## 2021-07-26 ENCOUNTER — Encounter (HOSPITAL_COMMUNITY): Payer: Self-pay | Admitting: Internal Medicine

## 2021-07-26 DIAGNOSIS — I5031 Acute diastolic (congestive) heart failure: Secondary | ICD-10-CM | POA: Diagnosis present

## 2021-07-26 DIAGNOSIS — W2209XA Striking against other stationary object, initial encounter: Secondary | ICD-10-CM | POA: Diagnosis not present

## 2021-07-26 DIAGNOSIS — Z888 Allergy status to other drugs, medicaments and biological substances status: Secondary | ICD-10-CM

## 2021-07-26 DIAGNOSIS — Z8249 Family history of ischemic heart disease and other diseases of the circulatory system: Secondary | ICD-10-CM

## 2021-07-26 DIAGNOSIS — Z79899 Other long term (current) drug therapy: Secondary | ICD-10-CM

## 2021-07-26 DIAGNOSIS — F32A Depression, unspecified: Secondary | ICD-10-CM | POA: Diagnosis present

## 2021-07-26 DIAGNOSIS — Z7901 Long term (current) use of anticoagulants: Secondary | ICD-10-CM

## 2021-07-26 DIAGNOSIS — I10 Essential (primary) hypertension: Secondary | ICD-10-CM

## 2021-07-26 DIAGNOSIS — I110 Hypertensive heart disease with heart failure: Principal | ICD-10-CM | POA: Diagnosis present

## 2021-07-26 DIAGNOSIS — R1084 Generalized abdominal pain: Secondary | ICD-10-CM

## 2021-07-26 DIAGNOSIS — N390 Urinary tract infection, site not specified: Secondary | ICD-10-CM | POA: Diagnosis present

## 2021-07-26 DIAGNOSIS — F23 Brief psychotic disorder: Secondary | ICD-10-CM

## 2021-07-26 DIAGNOSIS — I4819 Other persistent atrial fibrillation: Secondary | ICD-10-CM | POA: Diagnosis present

## 2021-07-26 DIAGNOSIS — R42 Dizziness and giddiness: Secondary | ICD-10-CM | POA: Diagnosis not present

## 2021-07-26 DIAGNOSIS — F05 Delirium due to known physiological condition: Secondary | ICD-10-CM

## 2021-07-26 DIAGNOSIS — G4700 Insomnia, unspecified: Secondary | ICD-10-CM | POA: Diagnosis present

## 2021-07-26 DIAGNOSIS — G894 Chronic pain syndrome: Secondary | ICD-10-CM | POA: Diagnosis present

## 2021-07-26 DIAGNOSIS — Z20822 Contact with and (suspected) exposure to covid-19: Secondary | ICD-10-CM | POA: Diagnosis present

## 2021-07-26 DIAGNOSIS — I4510 Unspecified right bundle-branch block: Secondary | ICD-10-CM | POA: Diagnosis present

## 2021-07-26 DIAGNOSIS — I5021 Acute systolic (congestive) heart failure: Principal | ICD-10-CM

## 2021-07-26 DIAGNOSIS — Y92239 Unspecified place in hospital as the place of occurrence of the external cause: Secondary | ICD-10-CM | POA: Diagnosis not present

## 2021-07-26 DIAGNOSIS — R918 Other nonspecific abnormal finding of lung field: Secondary | ICD-10-CM | POA: Diagnosis present

## 2021-07-26 DIAGNOSIS — E876 Hypokalemia: Secondary | ICD-10-CM

## 2021-07-26 DIAGNOSIS — Z66 Do not resuscitate: Secondary | ICD-10-CM | POA: Diagnosis present

## 2021-07-26 DIAGNOSIS — I350 Nonrheumatic aortic (valve) stenosis: Secondary | ICD-10-CM | POA: Diagnosis present

## 2021-07-26 DIAGNOSIS — J810 Acute pulmonary edema: Secondary | ICD-10-CM

## 2021-07-26 DIAGNOSIS — D6869 Other thrombophilia: Secondary | ICD-10-CM | POA: Diagnosis present

## 2021-07-26 DIAGNOSIS — K219 Gastro-esophageal reflux disease without esophagitis: Secondary | ICD-10-CM | POA: Diagnosis present

## 2021-07-26 DIAGNOSIS — K449 Diaphragmatic hernia without obstruction or gangrene: Secondary | ICD-10-CM | POA: Diagnosis present

## 2021-07-26 DIAGNOSIS — I4891 Unspecified atrial fibrillation: Secondary | ICD-10-CM | POA: Diagnosis present

## 2021-07-26 DIAGNOSIS — E782 Mixed hyperlipidemia: Secondary | ICD-10-CM | POA: Diagnosis present

## 2021-07-26 DIAGNOSIS — D6859 Other primary thrombophilia: Secondary | ICD-10-CM | POA: Diagnosis present

## 2021-07-26 DIAGNOSIS — J9601 Acute respiratory failure with hypoxia: Secondary | ICD-10-CM | POA: Diagnosis present

## 2021-07-26 NOTE — ED Triage Notes (Signed)
Pt bib [facility] from c/o weakness, SOB and palpitations. Hx afib, pt cardioverted yesterday. Pt 88% RA, 100% 3L [place]. HR 72-112 ST. BP 218/86 then 176/86. 18G LAC.  ?

## 2021-07-27 ENCOUNTER — Emergency Department (HOSPITAL_COMMUNITY): Payer: Medicare Other

## 2021-07-27 ENCOUNTER — Encounter (HOSPITAL_COMMUNITY): Payer: Self-pay | Admitting: Internal Medicine

## 2021-07-27 ENCOUNTER — Other Ambulatory Visit: Payer: Self-pay

## 2021-07-27 ENCOUNTER — Inpatient Hospital Stay (HOSPITAL_COMMUNITY): Payer: Medicare Other

## 2021-07-27 DIAGNOSIS — E876 Hypokalemia: Secondary | ICD-10-CM | POA: Diagnosis not present

## 2021-07-27 DIAGNOSIS — E782 Mixed hyperlipidemia: Secondary | ICD-10-CM | POA: Diagnosis present

## 2021-07-27 DIAGNOSIS — J9601 Acute respiratory failure with hypoxia: Secondary | ICD-10-CM | POA: Diagnosis not present

## 2021-07-27 DIAGNOSIS — K449 Diaphragmatic hernia without obstruction or gangrene: Secondary | ICD-10-CM | POA: Diagnosis present

## 2021-07-27 DIAGNOSIS — K219 Gastro-esophageal reflux disease without esophagitis: Secondary | ICD-10-CM

## 2021-07-27 DIAGNOSIS — R410 Disorientation, unspecified: Secondary | ICD-10-CM | POA: Diagnosis not present

## 2021-07-27 DIAGNOSIS — I4510 Unspecified right bundle-branch block: Secondary | ICD-10-CM | POA: Diagnosis present

## 2021-07-27 DIAGNOSIS — N390 Urinary tract infection, site not specified: Secondary | ICD-10-CM | POA: Diagnosis present

## 2021-07-27 DIAGNOSIS — Z888 Allergy status to other drugs, medicaments and biological substances status: Secondary | ICD-10-CM | POA: Diagnosis not present

## 2021-07-27 DIAGNOSIS — I5031 Acute diastolic (congestive) heart failure: Secondary | ICD-10-CM

## 2021-07-27 DIAGNOSIS — I4891 Unspecified atrial fibrillation: Secondary | ICD-10-CM | POA: Diagnosis not present

## 2021-07-27 DIAGNOSIS — I10 Essential (primary) hypertension: Secondary | ICD-10-CM | POA: Diagnosis not present

## 2021-07-27 DIAGNOSIS — Z79899 Other long term (current) drug therapy: Secondary | ICD-10-CM | POA: Diagnosis not present

## 2021-07-27 DIAGNOSIS — Y92239 Unspecified place in hospital as the place of occurrence of the external cause: Secondary | ICD-10-CM | POA: Diagnosis not present

## 2021-07-27 DIAGNOSIS — R42 Dizziness and giddiness: Secondary | ICD-10-CM | POA: Diagnosis not present

## 2021-07-27 DIAGNOSIS — Z20822 Contact with and (suspected) exposure to covid-19: Secondary | ICD-10-CM | POA: Diagnosis present

## 2021-07-27 DIAGNOSIS — Z8249 Family history of ischemic heart disease and other diseases of the circulatory system: Secondary | ICD-10-CM | POA: Diagnosis not present

## 2021-07-27 DIAGNOSIS — I350 Nonrheumatic aortic (valve) stenosis: Secondary | ICD-10-CM | POA: Diagnosis present

## 2021-07-27 DIAGNOSIS — D6869 Other thrombophilia: Secondary | ICD-10-CM | POA: Diagnosis not present

## 2021-07-27 DIAGNOSIS — Z66 Do not resuscitate: Secondary | ICD-10-CM | POA: Diagnosis present

## 2021-07-27 DIAGNOSIS — R609 Edema, unspecified: Secondary | ICD-10-CM | POA: Diagnosis not present

## 2021-07-27 DIAGNOSIS — J810 Acute pulmonary edema: Secondary | ICD-10-CM | POA: Diagnosis present

## 2021-07-27 DIAGNOSIS — I4819 Other persistent atrial fibrillation: Secondary | ICD-10-CM | POA: Diagnosis present

## 2021-07-27 DIAGNOSIS — G4700 Insomnia, unspecified: Secondary | ICD-10-CM | POA: Diagnosis present

## 2021-07-27 DIAGNOSIS — I110 Hypertensive heart disease with heart failure: Secondary | ICD-10-CM | POA: Diagnosis present

## 2021-07-27 DIAGNOSIS — F05 Delirium due to known physiological condition: Secondary | ICD-10-CM | POA: Diagnosis not present

## 2021-07-27 DIAGNOSIS — W2209XA Striking against other stationary object, initial encounter: Secondary | ICD-10-CM | POA: Diagnosis not present

## 2021-07-27 DIAGNOSIS — F32A Depression, unspecified: Secondary | ICD-10-CM | POA: Diagnosis present

## 2021-07-27 DIAGNOSIS — Z7901 Long term (current) use of anticoagulants: Secondary | ICD-10-CM | POA: Diagnosis not present

## 2021-07-27 DIAGNOSIS — D6859 Other primary thrombophilia: Secondary | ICD-10-CM | POA: Diagnosis present

## 2021-07-27 DIAGNOSIS — I160 Hypertensive urgency: Secondary | ICD-10-CM | POA: Diagnosis not present

## 2021-07-27 DIAGNOSIS — R918 Other nonspecific abnormal finding of lung field: Secondary | ICD-10-CM | POA: Diagnosis present

## 2021-07-27 LAB — BASIC METABOLIC PANEL
Anion gap: 10 (ref 5–15)
BUN: 14 mg/dL (ref 8–23)
CO2: 28 mmol/L (ref 22–32)
Calcium: 9.6 mg/dL (ref 8.9–10.3)
Chloride: 102 mmol/L (ref 98–111)
Creatinine, Ser: 0.96 mg/dL (ref 0.44–1.00)
GFR, Estimated: >60 mL/min (ref >60)
Glucose, Bld: 166 mg/dL — ABNORMAL HIGH (ref 70–99)
Potassium: 3.8 mmol/L (ref 3.5–5.1)
Sodium: 140 mmol/L (ref 135–145)

## 2021-07-27 LAB — URINALYSIS, ROUTINE W REFLEX MICROSCOPIC
Bilirubin Urine: NEGATIVE
Glucose, UA: 100 mg/dL — AB
Ketones, ur: NEGATIVE mg/dL
Nitrite: NEGATIVE
Protein, ur: NEGATIVE mg/dL
Specific Gravity, Urine: 1.01 (ref 1.005–1.030)
pH: 6 (ref 5.0–8.0)

## 2021-07-27 LAB — COMPREHENSIVE METABOLIC PANEL
ALT: 20 U/L (ref 0–44)
AST: 22 U/L (ref 15–41)
Albumin: 3.4 g/dL — ABNORMAL LOW (ref 3.5–5.0)
Alkaline Phosphatase: 62 U/L (ref 38–126)
Anion gap: 9 (ref 5–15)
BUN: 12 mg/dL (ref 8–23)
CO2: 30 mmol/L (ref 22–32)
Calcium: 8.9 mg/dL (ref 8.9–10.3)
Chloride: 99 mmol/L (ref 98–111)
Creatinine, Ser: 0.94 mg/dL (ref 0.44–1.00)
GFR, Estimated: >60 mL/min (ref >60)
Glucose, Bld: 150 mg/dL — ABNORMAL HIGH (ref 70–99)
Potassium: 3.6 mmol/L (ref 3.5–5.1)
Sodium: 138 mmol/L (ref 135–145)
Total Bilirubin: 1 mg/dL (ref 0.3–1.2)
Total Protein: 6.2 g/dL — ABNORMAL LOW (ref 6.5–8.1)

## 2021-07-27 LAB — ECHOCARDIOGRAM COMPLETE
AR max vel: 2.87 cm2
AV Area VTI: 2.79 cm2
AV Area mean vel: 2.63 cm2
AV Mean grad: 12 mmHg
AV Peak grad: 22.5 mmHg
Ao pk vel: 2.37 m/s
Area-P 1/2: 2.91 cm2
Calc EF: 62.9 %
Height: 66 in
S' Lateral: 3.1 cm
Single Plane A2C EF: 60.5 %
Single Plane A4C EF: 71.2 %
Weight: 2137.6 oz

## 2021-07-27 LAB — CBC WITH DIFFERENTIAL/PLATELET
Abs Immature Granulocytes: 0.04 10*3/uL (ref 0.00–0.07)
Abs Immature Granulocytes: 0.07 10*3/uL (ref 0.00–0.07)
Basophils Absolute: 0 10*3/uL (ref 0.0–0.1)
Basophils Absolute: 0.1 10*3/uL (ref 0.0–0.1)
Basophils Relative: 0 %
Basophils Relative: 0 %
Eosinophils Absolute: 0 10*3/uL (ref 0.0–0.5)
Eosinophils Absolute: 0.2 10*3/uL (ref 0.0–0.5)
Eosinophils Relative: 0 %
Eosinophils Relative: 2 %
HCT: 35.4 % — ABNORMAL LOW (ref 36.0–46.0)
HCT: 38.5 % (ref 36.0–46.0)
Hemoglobin: 11.4 g/dL — ABNORMAL LOW (ref 12.0–15.0)
Hemoglobin: 12.5 g/dL (ref 12.0–15.0)
Immature Granulocytes: 0 %
Immature Granulocytes: 1 %
Lymphocytes Relative: 10 %
Lymphocytes Relative: 5 %
Lymphs Abs: 0.7 10*3/uL (ref 0.7–4.0)
Lymphs Abs: 1.4 10*3/uL (ref 0.7–4.0)
MCH: 30.7 pg (ref 26.0–34.0)
MCH: 31.2 pg (ref 26.0–34.0)
MCHC: 32.2 g/dL (ref 30.0–36.0)
MCHC: 32.5 g/dL (ref 30.0–36.0)
MCV: 95.4 fL (ref 80.0–100.0)
MCV: 96 fL (ref 80.0–100.0)
Monocytes Absolute: 0.9 10*3/uL (ref 0.1–1.0)
Monocytes Absolute: 0.9 10*3/uL (ref 0.1–1.0)
Monocytes Relative: 6 %
Monocytes Relative: 7 %
Neutro Abs: 11.4 10*3/uL — ABNORMAL HIGH (ref 1.7–7.7)
Neutro Abs: 13.7 10*3/uL — ABNORMAL HIGH (ref 1.7–7.7)
Neutrophils Relative %: 81 %
Neutrophils Relative %: 88 %
Platelets: 209 10*3/uL (ref 150–400)
Platelets: 270 10*3/uL (ref 150–400)
RBC: 3.71 MIL/uL — ABNORMAL LOW (ref 3.87–5.11)
RBC: 4.01 MIL/uL (ref 3.87–5.11)
RDW: 13.7 % (ref 11.5–15.5)
RDW: 14.9 % (ref 11.5–15.5)
WBC: 14 10*3/uL — ABNORMAL HIGH (ref 4.0–10.5)
WBC: 15.4 10*3/uL — ABNORMAL HIGH (ref 4.0–10.5)
nRBC: 0 % (ref 0.0–0.2)
nRBC: 0 % (ref 0.0–0.2)

## 2021-07-27 LAB — URINALYSIS, MICROSCOPIC (REFLEX)

## 2021-07-27 LAB — TROPONIN I (HIGH SENSITIVITY)
Troponin I (High Sensitivity): 18 ng/L — ABNORMAL HIGH (ref <18)
Troponin I (High Sensitivity): 40 ng/L — ABNORMAL HIGH (ref <18)

## 2021-07-27 LAB — BRAIN NATRIURETIC PEPTIDE: B Natriuretic Peptide: 541.2 pg/mL — ABNORMAL HIGH (ref 0.0–100.0)

## 2021-07-27 LAB — RESP PANEL BY RT-PCR (FLU A&B, COVID) ARPGX2
Influenza A by PCR: NEGATIVE
Influenza B by PCR: NEGATIVE
SARS Coronavirus 2 by RT PCR: NEGATIVE

## 2021-07-27 LAB — MAGNESIUM: Magnesium: 1.5 mg/dL — ABNORMAL LOW (ref 1.7–2.4)

## 2021-07-27 MED ORDER — AMIODARONE HCL 200 MG PO TABS
200.0000 mg | ORAL_TABLET | Freq: Every day | ORAL | Status: DC
Start: 2021-07-28 — End: 2021-08-01
  Administered 2021-07-28 – 2021-08-01 (×5): 200 mg via ORAL
  Filled 2021-07-27 (×5): qty 1

## 2021-07-27 MED ORDER — SODIUM CHLORIDE 0.9 % IV SOLN: 250.0000 mL | INTRAVENOUS | Status: DC | PRN

## 2021-07-27 MED ORDER — ACETAMINOPHEN 325 MG PO TABS
650.0000 mg | ORAL_TABLET | ORAL | Status: DC | PRN
  Administered 2021-07-27 – 2021-07-31 (×4): 650 mg via ORAL
  Filled 2021-07-27 (×4): qty 2

## 2021-07-27 MED ORDER — NITROGLYCERIN IN D5W 200-5 MCG/ML-% IV SOLN
0.0000 ug/min | INTRAVENOUS | Status: DC
  Administered 2021-07-27: 5 ug/min via INTRAVENOUS
  Filled 2021-07-27 (×2): qty 250

## 2021-07-27 MED ORDER — POTASSIUM CHLORIDE CRYS ER 20 MEQ PO TBCR
40.0000 meq | EXTENDED_RELEASE_TABLET | Freq: Two times a day (BID) | ORAL | Status: AC
  Administered 2021-07-27 (×2): 40 meq via ORAL
  Filled 2021-07-27 (×2): qty 2

## 2021-07-27 MED ORDER — BUPROPION HCL ER (XL) 150 MG PO TB24
300.0000 mg | ORAL_TABLET | Freq: Every day | ORAL | Status: DC
  Administered 2021-07-27 – 2021-07-30 (×4): 300 mg via ORAL
  Filled 2021-07-27 (×4): qty 2

## 2021-07-27 MED ORDER — ONDANSETRON HCL 4 MG/2ML IJ SOLN
4.0000 mg | Freq: Four times a day (QID) | INTRAMUSCULAR | Status: DC | PRN
  Administered 2021-07-27: 4 mg via INTRAVENOUS
  Filled 2021-07-27: qty 2

## 2021-07-27 MED ORDER — POTASSIUM CHLORIDE 10 MEQ/100ML IV SOLN
10.0000 meq | INTRAVENOUS | Status: AC
  Administered 2021-07-27 (×2): 10 meq via INTRAVENOUS
  Filled 2021-07-27 (×2): qty 100

## 2021-07-27 MED ORDER — FUROSEMIDE 10 MG/ML IJ SOLN
40.0000 mg | Freq: Once | INTRAMUSCULAR | Status: AC
  Administered 2021-07-27: 40 mg via INTRAVENOUS
  Filled 2021-07-27: qty 4

## 2021-07-27 MED ORDER — MAGNESIUM SULFATE 2 GM/50ML IV SOLN
2.0000 g | Freq: Once | INTRAVENOUS | Status: AC
  Administered 2021-07-27: 2 g via INTRAVENOUS
  Filled 2021-07-27: qty 50

## 2021-07-27 MED ORDER — APIXABAN 5 MG PO TABS
5.0000 mg | ORAL_TABLET | Freq: Two times a day (BID) | ORAL | Status: DC
  Administered 2021-07-27 – 2021-08-01 (×10): 5 mg via ORAL
  Filled 2021-07-27 (×10): qty 1

## 2021-07-27 MED ORDER — IOHEXOL 300 MG/ML  SOLN
100.0000 mL | Freq: Once | INTRAMUSCULAR | Status: AC | PRN
  Administered 2021-07-27: 100 mL via INTRAVENOUS

## 2021-07-27 MED ORDER — SODIUM CHLORIDE 0.9% FLUSH
3.0000 mL | INTRAVENOUS | Status: DC | PRN
Start: 2021-07-27 — End: 2021-08-01

## 2021-07-27 MED ORDER — FUROSEMIDE 10 MG/ML IJ SOLN
40.0000 mg | Freq: Two times a day (BID) | INTRAMUSCULAR | Status: DC
  Administered 2021-07-27 (×2): 40 mg via INTRAVENOUS
  Filled 2021-07-27 (×2): qty 4

## 2021-07-27 MED ORDER — METOPROLOL TARTRATE 50 MG PO TABS
50.0000 mg | ORAL_TABLET | Freq: Two times a day (BID) | ORAL | Status: DC
Start: 2021-07-27 — End: 2021-08-01
  Administered 2021-07-27 – 2021-08-01 (×10): 50 mg via ORAL
  Filled 2021-07-27 (×4): qty 1
  Filled 2021-07-27: qty 2
  Filled 2021-07-27 (×5): qty 1

## 2021-07-27 MED ORDER — SODIUM CHLORIDE 0.9% FLUSH
3.0000 mL | Freq: Two times a day (BID) | INTRAVENOUS | Status: DC
  Administered 2021-07-27 – 2021-08-01 (×10): 3 mL via INTRAVENOUS

## 2021-07-27 MED ORDER — HYOSCYAMINE SULFATE ER 0.375 MG PO TB12
0.3750 mg | ORAL_TABLET | Freq: Two times a day (BID) | ORAL | Status: DC
  Administered 2021-07-27 – 2021-07-30 (×7): 0.375 mg via ORAL
  Filled 2021-07-27 (×7): qty 1

## 2021-07-27 MED ORDER — AMIODARONE HCL 200 MG PO TABS
400.0000 mg | ORAL_TABLET | Freq: Two times a day (BID) | ORAL | Status: DC
  Administered 2021-07-27: 400 mg via ORAL
  Filled 2021-07-27: qty 2

## 2021-07-27 MED ORDER — PANTOPRAZOLE SODIUM 40 MG PO TBEC
40.0000 mg | DELAYED_RELEASE_TABLET | Freq: Every day | ORAL | Status: DC
  Administered 2021-07-27 – 2021-08-01 (×6): 40 mg via ORAL
  Filled 2021-07-27 (×6): qty 1

## 2021-07-27 MED ORDER — DULOXETINE HCL 30 MG PO CPEP
30.0000 mg | ORAL_CAPSULE | Freq: Two times a day (BID) | ORAL | Status: DC
  Administered 2021-07-27 – 2021-08-01 (×11): 30 mg via ORAL
  Filled 2021-07-27 (×11): qty 1

## 2021-07-27 NOTE — Assessment & Plan Note (Signed)
Admit to progressive unit. Continue with Bipap. Confirmed with pt that she does not want to be intubated. Continue with diuresis. Pt just had DCCV performed on 07-25-2021. Check echo. May need CHF team to evaluate her. Continue with  IV lasix. ?

## 2021-07-27 NOTE — Consult Note (Addendum)
?Cardiology Consultation:  ? ?Patient ID: Maria Navarro ?MRN: [id number]; DOB: June 08, 1940 ? ?Admit date: 07/26/2021 ?Date of Consult: 07/27/2021 ? ?PCP:  Algis Greenhouse, MD ?  ?[facility] Providers ?Cardiologist:  Jenean Lindau, MD  ?Electrophysiologist:  Vickie Epley, MD  { ? ? ?Patient Profile:  ? ?Maria Navarro is a 82 y.o. female with a hx of persistent Afib, HTN, GERD, HLD, DM, anemia, mild AS, arthritis who is being seen 07/27/2021 for the evaluation of CHF at the request of Dr. Broadus John. ? ?History of Present Illness:  ? ?Maria Navarro is a 81 yo female with PMH noted above. She has been followed by Dr. Geraldo Pitter as an outpatient and recently seen by Dr. Quentin Ore.  ? ?She is originally found to be in atrial fibrillation in June 2022 when she presented to [facility] for elective neck surgery and was found to be in A-fib RVR.  Echocardiogram was done that showed mild stenosis but significantly calcified. Critical AS noted she was started on anticoagulation along with calcium channel blocker and beta-blocker. At that time she was referred to cardiology as an outpatient.  She was seen by Dr. Geraldo Pitter in the office on 10/2020 at which time she described some palpitations but it was felt she would be best served with cardioversion after being on 1 month of Eliquis.  She was scheduled for cardioversion but unfortunately missed several doses of Eliquis therefore was canceled.  She was again anticoagulated for 4 weeks and sent for outpatient cardioversion.  Despite 2 shocks of 100 J and 200 J they were unable to convert her to sinus.  She was referred to the A-fib clinic and discussion was had regarding flecainide, dofetilide and amiodarone for rhythm control.  Patient stated she would like to avoid further medications and consider her options.  Was scheduled for Tikosyn load and admission but ended up canceling. ? ?Ultimately referred to Dr. Quentin Ore for consideration of Watchman device.  She did note at her  office visit on 05/2021 bleeding issues including epistaxis and hematuria while on Eliquis.  She was started on amiodarone load with plans for repeat cardioversion after 6 weeks.  ? ?Underwent successful cardioversion on 07/25/2021 with Dr. Gasper Sells. ? ?Presented to the ED on 3/29 via EMS with complaints of weakness, shortness of breath and palpitations.  Room air sat noted at 88% with blood pressure greater than [id number] systolic.  States she did not feel "well" after undergoing cardioversion.  Later that evening developed shortness of breath which worsened into the following day.  Felt as if she was "suffocating".  EMS was called.  She was noted to be hypoxic on arrival and placed on BiPAP. ? ?Labs in the ED showed sodium 140, potassium 3.8, creatinine 0.96, BNP 541, high-sensitivity troponin 18>> 40, WBC 14, hemoglobin 12.5.  EKG showed sinus rhythm, 68 bpm, PACs, right bundle branch block.  Chest x-ray showed worsening CHF increased right upper lobe density suspicious for enlarging lesion.  Recommendations for CT chest with contrast.  CT abdomen pelvis with patchy groundglass opacities in lung bases concerning for edema versus pneumonitis, small bilateral effusions, cardiomegaly with coronary artery calcifications.  She required BiPAP and was placed on a nitroglycerin drip, along with IV Lasix.  Continued amiodarone and Eliquis.  Cardiology was consulted for further recommendations. ? ?Past Medical History:  ?Diagnosis Date  ? Allergic rhinitis 08/10/2015  ? Anemia 11/15/2020  ? Formatting of this note might be different from the original. 10/2020: incidental HGB 11.1,  MCV 96, RDW 14  ? Aortic valve sclerosis 05/14/2019  ? Formatting of this note might be different from the original. 06/02/2020: ECHO, mild  ? Atrial fibrillation ([place]) 10/26/2020  ? Formatting of this note might be different from the original. 10/26/2020: noted pre-op by anesthesia, rate 118, to ED  ? Benign hypertension 08/18/2015  ? Chronic bilateral low  back pain without sciatica 05/30/2020  ? Family history of premature coronary artery disease 04/14/2018  ? GERD (gastroesophageal reflux disease) 08/10/2015  ? Lumbar disc disease 05/30/2020  ? Mixed hyperlipidemia 10/03/2015  ? 2018: declined rx 2019: declinded rx  ? Osteoarthritis 08/10/2015  ? Osteoporosis 10/03/2015  ? 2008: -1.5 2017: -2.5 hip  Formatting of this note might be different from the original. 2008: -1.5 2017: -2.5 hip 2021: -2.6  ? Pain syndrome, chronic 08/10/2015  ? (2004) DX/RX: NSAID (2006) DX: NSAID Gastric ulcer with esoph stricture  (2006) RX: tramadol  ? Plantar fasciitis 08/18/2016  ? Recurrent major depressive disorder, in remission ([place]) 08/10/2015  ? Screening for diabetes mellitus (DM) 10/03/2015  ? ? ?Past Surgical History:  ?Procedure Laterality Date  ? APPENDECTOMY    ? CARDIOVERSION  12/29/2020  ? CARDIOVERSION N/A 07/25/2021  ? Procedure: CARDIOVERSION;  Surgeon: Werner Lean, MD;  Location: [facility];  Service: Cardiovascular;  Laterality: N/A;  ? CHOLECYSTECTOMY    ? FOOT SURGERY    ? Hernia repaired    ?  ? ?Home Medications:  ?Prior to Admission medications   ?Medication Sig Start Date End Date Taking? Authorizing Provider  ?acetaminophen (TYLENOL) 500 MG tablet Take 1,000 mg by mouth daily as needed for moderate pain or headache (Arthritis).   Yes [provider]  ?amiodarone (PACERONE) 200 MG tablet 400 mg (2 tablets) daily for 10 days, then 200 mg (1 tablet) daily from there ?Patient taking differently: Take 200 mg by mouth daily. 06/13/21  Yes Vickie Epley, MD  ?Boswellia-Glucosamine-Vit D (OSTEO BI-FLEX ONE PER DAY PO) Take 1 tablet by mouth 2 (two) times daily.   Yes [provider]  ?buPROPion (WELLBUTRIN XL) 300 MG 24 hr tablet Take 300 mg by mouth daily.   Yes [provider]  ?Casanthranol-Docusate Sodium (LAXATIVE PLUS STOOL SOFTENER PO) Take 2 tablets by mouth at bedtime.   Yes [provider]  ?Cholecalciferol (VITAMIN D3)  50 MCG (2000 UT) TABS Take 2,000 Units by mouth every morning.   Yes [provider]  ?Cyanocobalamin (VITAMIN B12) 1000 MCG TBCR Take 1,000 mcg by mouth every morning.   Yes [provider]  ?diltiazem (CARDIZEM CD) 240 MG 24 hr capsule Take 1 capsule (240 mg total) by mouth daily. 05/16/21  Yes Park Liter, MD  ?DULoxetine (CYMBALTA) 30 MG capsule Take 30 mg by mouth 2 (two) times daily. 08/12/20  Yes [provider]  ?ELIQUIS 5 MG TABS tablet Take 1 tablet (5 mg total) by mouth 2 (two) times daily. 11/28/20  Yes Park Liter, MD  ?fexofenadine (ALLEGRA) 180 MG tablet Take 180 mg by mouth daily.   Yes [provider]  ?iron polysaccharides (NIFEREX) 150 MG capsule Take 150 mg by mouth every other day.   Yes [provider]  ?metoprolol tartrate (LOPRESSOR) 50 MG tablet Take 1 tablet (50 mg total) by mouth 2 (two) times daily. 11/28/20  Yes Park Liter, MD  ?Multiple Vitamins-Minerals (VISION FORMULA PO) Take 1 tablet by mouth every morning. 54 +   Yes [provider]  ?Omega-3 Fatty  Acids (OMEGA-3 FISH OIL PO) Take 1,000 mg by mouth every morning.   Yes [provider]  ?pantoprazole (PROTONIX) 40 MG tablet Take 40 mg by mouth 2 (two) times daily. 06/12/18  Yes [provider]  ?Probiotic Product ([place]) CAPS Take 1 capsule by mouth 3 (three) times a week. No set days   Yes [provider]  ?traMADol (ULTRAM) 50 MG tablet Take 50 mg by mouth every 6 (six) hours as needed for moderate pain or severe pain.   Yes [provider]  ?Turmeric (QC TUMERIC COMPLEX PO) Take 1 tablet by mouth 3 (three) times a week. No set days   Yes [provider]  ? ? ?Inpatient Medications: ?Scheduled Meds: ? amiodarone  400 mg Oral BID  ? apixaban  5 mg Oral BID  ? buPROPion  300 mg Oral Daily  ? DULoxetine  30 mg Oral BID  ? furosemide  40 mg Intravenous Q12H  ? hyoscyamine  0.375 mg Oral Q12H  ?  metoprolol tartrate  50 mg Oral BID  ? pantoprazole  40 mg Oral Daily  ? potassium chloride  40 mEq Oral BID  ? sodium chloride flush  3 mL Intravenous Q12H  ? ?Continuous Infusions: ? sodium chloride    ? ?PRN

## 2021-07-27 NOTE — Subjective & Objective (Signed)
CC: SOB ?HPI: ?81 yo WF with history of chronic A-fib status post recent cardioversion, on amiodarone and Eliquis, history of hypertension, reflux, history of aortic stenosis presents to the ER today with sudden onset of shortness of breath that started this afternoon.  Patient denies any chest pain.  She states that she has been swollen in her legs for several days now.  She recently had a cardioversion done on 07/25/2021. ? ?She presented the ER with room air saturations of 80%.  Her initial blood pressure was 218/86. ? ?Per the EDP, patient was in respiratory extremis. ? ?Patient was started on BiPAP and then a nitroglycerin drip. ? ?[facility] hospitalist contacted for admission. ?

## 2021-07-27 NOTE — H&P (Signed)
?History and Physical  ? ? Maria Navarro?Maria Navarro [id number] DOB: [birth year] DOA: 07/26/2021 ? ?DOS: the patient was seen and examined on 07/26/2021 ? ?PCP: Olive Bassough, Robert L, MD  ? ?Patient coming from: Home ? ?I have personally briefly reviewed patient's [facility] ? ?CC: SOB ?HPI: ?81 yo WF with history of chronic A-fib status post recent cardioversion, on amiodarone and Eliquis, history of hypertension, reflux, history of aortic stenosis presents to the ER today with sudden onset of shortness of breath that started this afternoon.  Patient denies any chest pain.  She states that she has been swollen in her legs for several days now.  She recently had a cardioversion done on 07/25/2021. ? ?She presented the ER with room air saturations of 80%.  Her initial blood pressure was 218/86. ? ?Per the EDP, patient was in respiratory extremis. ? ?Patient was started on BiPAP and then a nitroglycerin drip. ? ?[facility] hospitalist contacted for admission.  ? ?ED Course: Presented to the ER in respiratory extremis and malignant hypertension.  Started on BiPAP and nitroglycerin drip. ? ?Review of Systems:  ?Review of Systems  ?Constitutional:  Negative for chills, fever and weight loss.  ?HENT: Negative.    ?Eyes: Negative.   ?Respiratory:  Positive for shortness of breath.   ?Cardiovascular:  Positive for leg swelling.  ?Gastrointestinal:  Positive for abdominal pain.  ?     Abd pain started this evening as well. Diffuse lower abd. No localizing signs  ?Genitourinary: Negative.   ?Musculoskeletal: Negative.   ?Skin: Negative.   ?Neurological: Negative.   ?Endo/Heme/Allergies: Negative.   ?Psychiatric/Behavioral: Negative.    ?All other systems reviewed and are negative. ? ?Past Medical History:  ?Diagnosis Date  ? Allergic rhinitis 08/10/2015  ? Anemia 11/15/2020  ? Formatting of this note might be different from the original. 10/2020: incidental HGB 11.1, MCV 96, RDW 14  ? Aortic valve sclerosis 05/14/2019  ?  Formatting of this note might be different from the original. 06/02/2020: ECHO, mild  ? Atrial fibrillation ([facility]) 10/26/2020  ? Formatting of this note might be different from the original. 10/26/2020: noted pre-op by anesthesia, rate 118, to ED  ? Benign hypertension 08/18/2015  ? Chronic bilateral low back pain without sciatica 05/30/2020  ? Family history of premature coronary artery disease 04/14/2018  ? GERD (gastroesophageal reflux disease) 08/10/2015  ? Lumbar disc disease 05/30/2020  ? Mixed hyperlipidemia 10/03/2015  ? 2018: declined rx 2019: declinded rx  ? Osteoarthritis 08/10/2015  ? Osteoporosis 10/03/2015  ? 2008: -1.5 2017: -2.5 hip  Formatting of this note might be different from the original. 2008: -1.5 2017: -2.5 hip 2021: -2.6  ? Pain syndrome, chronic 08/10/2015  ? (2004) DX/RX: NSAID (2006) DX: NSAID Gastric ulcer with esoph stricture  (2006) RX: tramadol  ? Plantar fasciitis 08/18/2016  ? Recurrent major depressive disorder, in remission ([facility]) 08/10/2015  ? Screening for diabetes mellitus (DM) 10/03/2015  ? ? ?Past Surgical History:  ?Procedure Laterality Date  ? APPENDECTOMY    ? CARDIOVERSION  12/29/2020  ? CARDIOVERSION N/A 07/25/2021  ? Procedure: CARDIOVERSION;  Surgeon: Christell Constanthandrasekhar, Mahesh A, MD;  Location: [facility];  Service: Cardiovascular;  Laterality: N/A;  ? CHOLECYSTECTOMY    ? FOOT SURGERY    ? Hernia repaired    ? ? ? reports that she has never smoked. She has never used smokeless tobacco. No history on file for alcohol use and drug use. ? ?Allergies  ?Allergen Reactions  ?  Escitalopram Other (See Comments)  ?  sleepy  ? ? ?Family History  ?Family history unknown: Yes  ? ? ?Prior to Admission medications   ?Medication Sig Start Date End Date Taking? Authorizing Provider  ?acetaminophen (TYLENOL) 500 MG tablet Take 500 mg by mouth daily as needed (Arthritis).    [provider]  ?amiodarone (PACERONE) 200 MG tablet 400 mg (2 tablets) daily for 10 days, then 200 mg (1 tablet) daily from  there ?Patient taking differently: Take 200 mg by mouth daily. 06/13/21   Lanier Prude, MD  ?Boswellia-Glucosamine-Vit D (OSTEO BI-FLEX ONE PER DAY PO) Take 1 tablet by mouth 2 (two) times daily.    [provider]  ?buPROPion (WELLBUTRIN XL) 300 MG 24 hr tablet Take 300 mg by mouth daily.    [provider]  ?Casanthranol-Docusate Sodium (LAXATIVE PLUS STOOL SOFTENER PO) Take 2 tablets by mouth at bedtime.    [provider]  ?Cholecalciferol (VITAMIN D3) 50 MCG (2000 UT) TABS Take 1 tablet by mouth every morning.    [provider]  ?Cyanocobalamin (VITAMIN B12) 1000 MCG TBCR Take 1 tablet by mouth every morning.    [provider]  ?diltiazem (CARDIZEM CD) 240 MG 24 hr capsule Take 1 capsule (240 mg total) by mouth daily. 05/16/21   Georgeanna Lea, MD  ?DULoxetine (CYMBALTA) 30 MG capsule Take 30 mg by mouth 2 (two) times daily. 08/12/20   [provider]  ?ELIQUIS 5 MG TABS tablet Take 1 tablet (5 mg total) by mouth 2 (two) times daily. 11/28/20   Georgeanna Lea, MD  ?fexofenadine (ALLEGRA) 180 MG tablet Take 180 mg by mouth daily.    [provider]  ?iron polysaccharides (NIFEREX) 150 MG capsule Take 150 mg by mouth 3 (three) times a week.    [provider]  ?metoprolol tartrate (LOPRESSOR) 50 MG tablet Take 1 tablet (50 mg total) by mouth 2 (two) times daily. 11/28/20   Georgeanna Lea, MD  ?Multiple Vitamins-Minerals (VISION FORMULA PO) Take 1 tablet by mouth every morning. 50 +    [provider]  ?Omega-3 Fatty Acids (OMEGA-3 FISH OIL PO) Take 1,000 mg by mouth every morning.    [provider]  ?pantoprazole (PROTONIX) 40 MG tablet Take 40 mg by mouth 2 (two) times daily. 06/12/18   [provider]  ?Probiotic Product (PHILLIPS COLON HEALTH) CAPS Take 1 capsule by mouth every morning.    [provider]  ?traMADol (ULTRAM) 50 MG tablet Take 50 mg by mouth every 6 (six) hours as needed  for moderate pain or severe pain.    [provider]  ?Turmeric (QC TUMERIC COMPLEX PO) Take 1 tablet by mouth daily. Unknown strenght    [provider]  ? ? ?Physical Exam: ?Vitals:  ? 07/27/21 0115 07/27/21 0130 07/27/21 0145 07/27/21 0200  ?BP: (!) 176/95 (!) 168/90 (!) 172/98 (!) 151/81  ?Pulse: 66 66 66 62  ?Resp: (!) 24 (!) 23 15 (!) 24  ?Temp:      ?TempSrc:      ?SpO2: 100% 98% 98% 99%  ? ? ?Physical Exam ?Vitals and nursing note reviewed.  ?Constitutional:   ?   General: She is not in acute distress. ?   Appearance: Normal appearance. She is normal weight. She is not diaphoretic.  ?   Comments: Currently on BiPAP.  On nitro glycerin drip.  ?HENT:  ?   Head: Normocephalic and atraumatic.  ?   Nose:  Nose normal. No rhinorrhea.  ?Eyes:  ?   General: No scleral icterus. ?Cardiovascular:  ?   Rate and Rhythm: Normal rate and regular rhythm.  ?   Heart sounds: Murmur heard.  ?Pulmonary:  ?   Comments: Coarse breath sounds.  Difficult to auscultate with BiPAP. ?Abdominal:  ?   General: Abdomen is flat. Bowel sounds are normal. There is no distension.  ?   Palpations: Abdomen is soft.  ?   Tenderness: There is abdominal tenderness in the right lower quadrant and left lower quadrant. There is no guarding or rebound.  ?Musculoskeletal:  ?   Right lower leg: 1+ Pitting Edema present.  ?   Left lower leg: 1+ Pitting Edema present.  ?Skin: ?   General: Skin is warm and dry.  ?   Capillary Refill: Capillary refill takes less than 2 seconds.  ?Neurological:  ?   General: No focal deficit present.  ?   Mental Status: She is alert and oriented to person, place, and time.  ?  ? ?Labs on Admission: I have personally reviewed following labs and imaging studies ? ?CBC: ?Recent Labs  ?Lab 07/27/21 ?0013  ?WBC 14.0*  ?NEUTROABS 11.4*  ?HGB 12.5  ?HCT 38.5  ?MCV 96.0  ?PLT 270  ? ?Basic Metabolic Panel: ?Recent Labs  ?Lab 07/27/21 ?0103  ?NA 140  ?K 3.8  ?CL 102  ?CO2 28  ?GLUCOSE 166*  ?BUN 14  ?CREATININE 0.96   ?CALCIUM 9.6  ? ?GFR: ?Estimated Creatinine Clearance: 44.5 mL/min (by C-G formula based on SCr of 0.96 mg/dL). ?Liver Function Tests: ?No results for input(s): AST, ALT, ALKPHOS, BILITOT, PROT, ALBUM

## 2021-07-27 NOTE — ED Notes (Signed)
Chen DO notified regarding pts troponin from 18 to 40  ?

## 2021-07-27 NOTE — ED Notes (Signed)
Increased Nitro to 40 mcg/min per Bebe Shaggy MD verbal order ?

## 2021-07-27 NOTE — Progress Notes (Signed)
?  Echocardiogram ?2D Echocardiogram has been performed. ? ?Maria Navarro ?07/27/2021, 4:25 PM ?

## 2021-07-27 NOTE — Assessment & Plan Note (Signed)
Continue with NTG gtts. ?

## 2021-07-27 NOTE — Plan of Care (Signed)

## 2021-07-27 NOTE — Assessment & Plan Note (Signed)
IV protonix  

## 2021-07-27 NOTE — Progress Notes (Signed)
Heart Failure Navigator Progress Note ? ?Following this hospitalization to assess for HV TOC readiness.  ? ?Waiting on Echo results.. ? ?Watchman planned for 08/12/21. ? ?Rhae Hammock, BSN, RN ?Heart Failure Nurse Navigator ?[phone]  ?

## 2021-07-27 NOTE — ED Notes (Signed)
RT called to place pt on BIPAP

## 2021-07-27 NOTE — Progress Notes (Signed)
Pt is currently stable on [place] no signs of respiratory distress at this time. Bipap at bedside if needed. RT will continue to monitor. ?

## 2021-07-27 NOTE — Progress Notes (Addendum)
?PROGRESS NOTE ? ? ? Maria Navarro  [id number] DOB: 1940/12/13 DOA: 07/26/2021 ?PCP: Algis Greenhouse, MD  ?Narrative 80/F with history of chronic A-fib on amiodarone, Eliquis, recent cardioversion, history of aortic stenosis presented to the ED with acute worsening of dyspnea yesterday 3/29.  She noticed dyspnea on exertion for 3 to 4 days, with some leg swelling. ?-Presented to the ED in respiratory distress, blood pressure was 218/86, ? ? ?Subjective: Breathing better today, reports acute worsening of symptoms yesterday, had mild dyspnea on exertion prior to that ? ?Assessment and Plan: ? ?Pulmonary edema ?Acute diastolic CHF  ?-Off BiPAP this morning ?-Continue IV Lasix today, wean off nitro gtt. ?-Repeat 2D echocardiogram ?-troponin minimally elevated, no ACS ? ?Malignant hypertension ?-Improving, wean nitro gtt., diuretics as above ? ?Abnormal chest x-ray ?-Needs follow-up CT chest in few weeks ? ?Hypomagnesemia ?-Replace ? ?P.Atrial fibrillation (Maria Navarro) ?-recent DCCV 3/28 ?On amiodarone-high-dose and eliquis. ? ?GERD (gastroesophageal reflux disease) ?Hiatal hernia ?-Continue Protonix ?-Subacute/chronic abdominal discomfort, add low-dose hyoscyamine, refer back to GI, CT unremarkable ? ?DVT prophylaxis: Eliquis ?Code Status: DNR ?Family Communication: Discussed with patient in detail, no family at bedside ?Disposition Plan: Home likely 2 to 3 days ? ?Consultants:  ?Cards ? ?Procedures:  ? ?Antimicrobials:  ? ? ?Objective: ?Vitals:  ? 07/27/21 0825 07/27/21 0845 07/27/21 0915 07/27/21 1015  ?BP: 120/70 (!) 145/74 (!) 146/69 138/66  ?Pulse: 60 (!) 59 60 (!) 59  ?Resp: 18 18 18 16   ?Temp:    97.8 ?F (36.6 ?C)  ?TempSrc:      ?SpO2: 100% 100% 98% 99%  ?Weight:      ?Height:      ? ? ?Intake/Output Summary (Last 24 hours) at 07/27/2021 1056 ?Last data filed at 07/27/2021 1017 ?Gross per 24 hour  ?Intake 280.52 ml  ?Output 2750 ml  ?Net -2469.48 ml  ? ?Filed Weights  ? 07/27/21 0319  ?Weight: 61.2 kg   ? ? ?Examination: ? ?General exam: Pleasant elderly female sitting up in bed, AAOx3, no distress ?HEENT: Positive JVD ?CVS: S1-S2, regular rate rhythm, systolic murmur ?Lungs: Fine bilateral Rales ?Abdomen: Soft, nontender, bowel sounds present ?Extremities: Trace edema ?Skin: No rashes ?Psychiatry: Judgement and insight appear normal. Mood & affect appropriate.  ? ? ? ?Data Reviewed:  ? ?CBC: ?Recent Labs  ?Lab 07/27/21 ?0013 07/27/21 ?[id number]  ?WBC 14.0* 15.4*  ?NEUTROABS 11.4* 13.7*  ?HGB 12.5 11.4*  ?HCT 38.5 35.4*  ?MCV 96.0 95.4  ?PLT 270 209  ? ?Basic Metabolic Panel: ?Recent Labs  ?Lab 07/27/21 ?0103 07/27/21 ?[id number]  ?NA 140 138  ?K 3.8 3.6  ?CL 102 99  ?CO2 28 30  ?GLUCOSE 166* 150*  ?BUN 14 12  ?CREATININE 0.96 0.94  ?CALCIUM 9.6 8.9  ?MG  --  1.5*  ? ?GFR: ?Estimated Creatinine Clearance: 45.4 mL/min (by C-G formula based on SCr of 0.94 mg/dL). ?Liver Function Tests: ?Recent Labs  ?Lab 07/27/21 ?[id number]  ?AST 22  ?ALT 20  ?ALKPHOS 62  ?BILITOT 1.0  ?PROT 6.2*  ?ALBUMIN 3.4*  ? ?No results for input(s): LIPASE, AMYLASE in the last 168 hours. ?No results for input(s): AMMONIA in the last 168 hours. ?Coagulation Profile: ?No results for input(s): INR, PROTIME in the last 168 hours. ?Cardiac Enzymes: ?No results for input(s): CKTOTAL, CKMB, CKMBINDEX, TROPONINI in the last 168 hours. ?BNP (last 3 results) ?No results for input(s): PROBNP in the last 8760 hours. ?HbA1C: ?No results for input(s): HGBA1C in the last 72 hours. ?CBG: ?No results for  input(s): GLUCAP in the last 168 hours. ?Lipid Profile: ?No results for input(s): CHOL, HDL, LDLCALC, TRIG, CHOLHDL, LDLDIRECT in the last 72 hours. ?Thyroid Function Tests: ?No results for input(s): TSH, T4TOTAL, FREET4, T3FREE, THYROIDAB in the last 72 hours. ?Anemia Panel: ?No results for input(s): VITAMINB12, FOLATE, FERRITIN, TIBC, IRON, RETICCTPCT in the last 72 hours. ?Urine analysis: ?   ?Component Value Date/Time  ? [place] YELLOW 07/27/2021 0315  ? APPEARANCEUR  CLEAR 07/27/2021 0315  ? LABSPEC 1.010 07/27/2021 0315  ? PHURINE 6.0 07/27/2021 0315  ? GLUCOSEU 100 (A) 07/27/2021 0315  ? HGBUR SMALL (A) 07/27/2021 0315  ? [place] NEGATIVE 07/27/2021 0315  ? [place] NEGATIVE 07/27/2021 0315  ? [place] NEGATIVE 07/27/2021 0315  ? NITRITE NEGATIVE 07/27/2021 0315  ? LEUKOCYTESUR TRACE (A) 07/27/2021 0315  ? ?Sepsis Labs: ?@LABRCNTIP (procalcitonin:4,lacticidven:4) ? ?) ?Recent Results (from the past 240 hour(s))  ?Resp Panel by RT-PCR (Flu A&B, Covid) Nasopharyngeal Swab     Status: None  ? Collection Time: 07/27/21 12:26 AM  ? Specimen: Nasopharyngeal Swab; Nasopharyngeal(NP) swabs in vial transport medium  ?Result Value Ref Range Status  ? SARS Coronavirus 2 by RT PCR NEGATIVE NEGATIVE Final  ?  Comment: (NOTE) ?SARS-CoV-2 target nucleic acids are NOT DETECTED. ? ?The SARS-CoV-2 RNA is generally detectable in upper respiratory ?specimens during the acute phase of infection. The lowest ?concentration of SARS-CoV-2 viral copies this assay can detect is ?138 copies/mL. A negative result does not preclude SARS-Cov-2 ?infection and should not be used as the sole basis for treatment or ?other patient management decisions. A negative result may occur with  ?improper specimen collection/handling, submission of specimen other ?than nasopharyngeal swab, presence of viral mutation(s) within the ?areas targeted by this assay, and inadequate number of viral ?copies(<138 copies/mL). A negative result must be combined with ?clinical observations, patient history, and epidemiological ?information. The expected result is Negative. ? ?Fact Sheet for Patients:  ?EntrepreneurPulse.com.au ? ?Fact Sheet for Healthcare Providers:  ?IncredibleEmployment.be ? ?This test is no t yet approved or cleared by the [place] FDA and  ?has been authorized for detection and/or diagnosis of SARS-CoV-2 by ?FDA under an Emergency Use Authorization (EUA). This EUA will  remain  ?in effect (meaning this test can be used) for the duration of the ?COVID-19 declaration under Section 564(b)(1) of the Act, 21 ?U.S.C.section 360bbb-3(b)(1), unless the authorization is terminated  ?or revoked sooner.  ? ? ?  ? Influenza A by PCR NEGATIVE NEGATIVE Final  ? Influenza B by PCR NEGATIVE NEGATIVE Final  ?  Comment: (NOTE) ?The Xpert Xpress SARS-CoV-2/FLU/RSV plus assay is intended as an aid ?in the diagnosis of influenza from Nasopharyngeal swab specimens and ?should not be used as a sole basis for treatment. Nasal washings and ?aspirates are unacceptable for Xpert Xpress SARS-CoV-2/FLU/RSV ?testing. ? ?Fact Sheet for Patients: ?EntrepreneurPulse.com.au ? ?Fact Sheet for Healthcare Providers: ?IncredibleEmployment.be ? ?This test is not yet approved or cleared by the [place] FDA and ?has been authorized for detection and/or diagnosis of SARS-CoV-2 by ?FDA under an Emergency Use Authorization (EUA). This EUA will remain ?in effect (meaning this test can be used) for the duration of the ?COVID-19 declaration under Section 564(b)(1) of the Act, 21 U.S.C. ?section 360bbb-3(b)(1), unless the authorization is terminated or ?revoked. ? ?Performed at [facility], [place] [address], [place], [place] ?[address] ?  ?  ? ?Radiology Studies: ?CT ABDOMEN PELVIS W CONTRAST ? ?Result Date: 07/27/2021 ?CLINICAL DATA:  Abdominal pain, acute, nonlocalized. Weakness, shortness of  breath, and palpitations. EXAM: CT ABDOMEN AND PELVIS WITH CONTRAST TECHNIQUE: Multidetector CT imaging of the abdomen and pelvis was performed using the standard protocol following bolus administration of intravenous contrast. RADIATION DOSE REDUCTION: This exam was performed according to the departmental dose-optimization program which includes automated exposure control, adjustment of the mA and/or kV according to patient size and/or use of iterative reconstruction technique. CONTRAST:  172mL  OMNIPAQUE IOHEXOL 300 MG/ML  SOLN COMPARISON:  08/29/2018. FINDINGS: Lower chest: The heart is enlarged and coronary artery calcifications are noted. There is a large hiatal hernia. Small bilateral pleural effusions

## 2021-07-27 NOTE — Assessment & Plan Note (Signed)
Continue with Eliquis. ?

## 2021-07-27 NOTE — ED Notes (Signed)
Pt transported to CT by this RN. Pt taken off BIPAP. O2 remained 90-98%  5L [place] for the duration of the scan. Pt became SOB when moving back to stretcher from CT table with O2 sats in the 80s. Pt placed back on BIPAP in room. O2 currently 100% with BIPAP. ?

## 2021-07-27 NOTE — Progress Notes (Signed)
Received call to keep patient NPO at [facility] for Renal Ultrasound in morning.  ?

## 2021-07-27 NOTE — ED Provider Notes (Signed)
?[facility] ?Provider Note ? ? ?CSN: [id number] ?Arrival date & time: 07/26/21  2331 ? ?  ? ?History ? ?Chief Complaint  ?Patient presents with  ? Shortness of Breath  ? Weakness  ? ? ?Maria Navarro is a 81 y.o. female. ? ?The history is provided by the patient.  ?Shortness of Breath ?Severity:  Moderate ?Onset quality:  Gradual ?Duration:  1 day ?Timing:  Constant ?Progression:  Worsening ?Chronicity:  New ?Relieved by:  Rest ?Worsened by:  Exertion ?Associated symptoms: abdominal pain and cough   ?Associated symptoms: no chest pain, no fever and no hemoptysis   ?Weakness ?Associated symptoms: abdominal pain, cough, diarrhea and shortness of breath   ?Associated symptoms: no chest pain and no fever   ?Patient presents with increasing shortness of breath over the past day. ?Patient underwent electrical cardioversion for atrial fibrillation on March 28.  She reports the following day she began having increasing shortness of breath.  It is worse with exertion.  She also reports cough, but no hemoptysis.  No chest pain.  She does report some diarrhea and abdominal discomfort recently after starting a new medication. ?No severe abdominal pain at this time. ?She does report lower extremity edema ? ?Reports previously being diagnosed with emphysema but is been stable ?She is a non-smoker.  She is not on home oxygen ?  ?Past Medical History:  ?Diagnosis Date  ? Allergic rhinitis 08/10/2015  ? Anemia 11/15/2020  ? Formatting of this note might be different from the original. 10/2020: incidental HGB 11.1, MCV 96, RDW 14  ? Aortic valve sclerosis 05/14/2019  ? Formatting of this note might be different from the original. 06/02/2020: ECHO, mild  ? Atrial fibrillation ([place]) 10/26/2020  ? Formatting of this note might be different from the original. 10/26/2020: noted pre-op by anesthesia, rate 118, to ED  ? Benign hypertension 08/18/2015  ? Chronic bilateral low back pain without sciatica 05/30/2020   ? Family history of premature coronary artery disease 04/14/2018  ? GERD (gastroesophageal reflux disease) 08/10/2015  ? Lumbar disc disease 05/30/2020  ? Mixed hyperlipidemia 10/03/2015  ? 2018: declined rx 2019: declinded rx  ? Osteoarthritis 08/10/2015  ? Osteoporosis 10/03/2015  ? 2008: -1.5 2017: -2.5 hip  Formatting of this note might be different from the original. 2008: -1.5 2017: -2.5 hip 2021: -2.6  ? Pain syndrome, chronic 08/10/2015  ? (2004) DX/RX: NSAID (2006) DX: NSAID Gastric ulcer with esoph stricture  (2006) RX: tramadol  ? Plantar fasciitis 08/18/2016  ? Recurrent major depressive disorder, in remission ([place]) 08/10/2015  ? Screening for diabetes mellitus (DM) 10/03/2015  ? ? ?Home Medications ?Prior to Admission medications   ?Medication Sig Start Date End Date Taking? Authorizing Provider  ?acetaminophen (TYLENOL) 500 MG tablet Take 500 mg by mouth daily as needed ([place]).    [provider]  ?amiodarone (PACERONE) 200 MG tablet 400 mg (2 tablets) daily for 10 days, then 200 mg (1 tablet) daily from there ?Patient taking differently: Take 200 mg by mouth daily. 06/13/21   Vickie Epley, MD  ?Boswellia-Glucosamine-Vit D (OSTEO BI-FLEX ONE PER DAY PO) Take 1 tablet by mouth 2 (two) times daily.    [provider]  ?buPROPion (WELLBUTRIN XL) 300 MG 24 hr tablet Take 300 mg by mouth daily.    [provider]  ?Casanthranol-Docusate Sodium (LAXATIVE PLUS STOOL SOFTENER PO) Take 2 tablets by mouth at bedtime.    [provider]  ?Cholecalciferol (VITAMIN D3)  50 MCG (2000 UT) TABS Take 1 tablet by mouth every morning.    [provider]  ?Cyanocobalamin (VITAMIN B12) 1000 MCG TBCR Take 1 tablet by mouth every morning.    [provider]  ?diltiazem (CARDIZEM CD) 240 MG 24 hr capsule Take 1 capsule (240 mg total) by mouth daily. 05/16/21   Park Liter, MD  ?DULoxetine (CYMBALTA) 30 MG capsule Take 30 mg by mouth 2 (two) times daily. 08/12/20    [provider]  ?ELIQUIS 5 MG TABS tablet Take 1 tablet (5 mg total) by mouth 2 (two) times daily. 11/28/20   Park Liter, MD  ?fexofenadine (ALLEGRA) 180 MG tablet Take 180 mg by mouth daily.    [provider]  ?iron polysaccharides (NIFEREX) 150 MG capsule Take 150 mg by mouth 3 (three) times a week.    [provider]  ?metoprolol tartrate (LOPRESSOR) 50 MG tablet Take 1 tablet (50 mg total) by mouth 2 (two) times daily. 11/28/20   Park Liter, MD  ?Multiple Vitamins-Minerals (VISION FORMULA PO) Take 1 tablet by mouth every morning. 50 +    [provider]  ?Omega-3 Fatty Acids (OMEGA-3 FISH OIL PO) Take 1,000 mg by mouth every morning.    [provider]  ?pantoprazole (PROTONIX) 40 MG tablet Take 40 mg by mouth 2 (two) times daily. 06/12/18   [provider]  ?Probiotic Product ([place]) CAPS Take 1 capsule by mouth every morning.    [provider]  ?traMADol (ULTRAM) 50 MG tablet Take 50 mg by mouth every 6 (six) hours as needed for moderate pain or severe pain.    [provider]  ?Turmeric (QC TUMERIC COMPLEX PO) Take 1 tablet by mouth daily. Unknown strenght    [provider]  ?   ? ?Allergies    ?Escitalopram   ? ?Review of Systems   ?Review of Systems  ?Constitutional:  Negative for fever.  ?Respiratory:  Positive for cough and shortness of breath. Negative for hemoptysis.   ?Cardiovascular:  Positive for palpitations and leg swelling. Negative for chest pain.  ?Gastrointestinal:  Positive for abdominal pain and diarrhea.  ?Neurological:  Positive for weakness.  ?All other systems reviewed and are negative. ? ?Physical Exam ?Updated Vital Signs ?BP (!) 151/81   Pulse 62   Temp 98.1 ?F (36.7 ?C) (Oral)   Resp (!) 24   LMP  (LMP Unknown)   SpO2 99%  ?Physical Exam ?CONSTITUTIONAL: Elderly, frail ?HEAD: Normocephalic/atraumatic ?EYES: EOMI/PERRL ?ENMT: Mucous membranes moist ?NECK: supple no  meningeal signs,+ JVD ?SPINE/BACK:entire spine nontender ?CV: S1/S2 noted, no murmurs/rubs/gallops noted ?LUNGS: Crackles bilaterally, tachypneic 88% on room air ?ABDOMEN: soft, nontender, no rebound or guarding, bowel sounds noted throughout abdomen ?GU:no cva tenderness ?NEURO: Pt is awake/alert/appropriate, moves all extremitiesx4.  No facial droop.   ?EXTREMITIES: pulses normal/equal, full ROM, mild lower extremity edema noted bilaterally ?SKIN: warm, color normal ?PSYCH: no abnormalities of mood noted, alert and oriented to situation ? ?ED Results / Procedures / Treatments   ?Labs ?(all labs ordered are listed, but only abnormal results are displayed) ?Labs Reviewed  ?CBC WITH DIFFERENTIAL/PLATELET - Abnormal; Notable for the following components:  ?    Result Value  ? WBC 14.0 (*)   ? Neutro Abs 11.4 (*)   ? All other components within normal limits  ?BASIC METABOLIC PANEL - Abnormal; Notable for the following components:  ? Glucose, Bld 166 (*)   ? All other components within  normal limits  ?TROPONIN I (HIGH SENSITIVITY) - Abnormal; Notable for the following components:  ? Troponin I (High Sensitivity) 18 (*)   ? All other components within normal limits  ?RESP PANEL BY RT-PCR (FLU A&B, COVID) ARPGX2  ?BRAIN NATRIURETIC PEPTIDE  ?URINALYSIS, ROUTINE W REFLEX MICROSCOPIC  ?TROPONIN I (HIGH SENSITIVITY)  ? ? ?EKG ?EKG Interpretation ? ?Date/Time:  Thursday July 27 2021 00:00:13 EDT ?Ventricular Rate:  68 ?PR Interval:  170 ?QRS Duration: 144 ?QT Interval:  460 ?QTC Calculation: 489 ?R Axis:   84 ?Text Interpretation: Sinus rhythm with Premature atrial complexes Right bundle branch block Abnormal ECG When compared with ECG of 25-Jul-2021 10:02, No significant change since last tracing Confirmed by Ripley Fraise [phone]) on 07/27/2021 12:02:14 AM ? ?Radiology ?DG Chest Port 1 View ? ?Result Date: 07/27/2021 ?CLINICAL DATA:  Shortness of breath EXAM: PORTABLE CHEST 1 VIEW COMPARISON:  10/26/2020 FINDINGS: Cardiac  shadow is enlarged but stable. Aortic calcifications are again seen. Persistent and increased density is noted in the right upper lobe. Nodular opacity is noted in the left upper lobe as well. Diffuse interstitia

## 2021-07-27 NOTE — Assessment & Plan Note (Signed)
On amiodarone and eliquis. 

## 2021-07-28 ENCOUNTER — Inpatient Hospital Stay (HOSPITAL_COMMUNITY): Payer: Medicare Other

## 2021-07-28 DIAGNOSIS — I5031 Acute diastolic (congestive) heart failure: Secondary | ICD-10-CM | POA: Diagnosis not present

## 2021-07-28 DIAGNOSIS — I4819 Other persistent atrial fibrillation: Secondary | ICD-10-CM | POA: Diagnosis not present

## 2021-07-28 DIAGNOSIS — R609 Edema, unspecified: Secondary | ICD-10-CM | POA: Diagnosis not present

## 2021-07-28 DIAGNOSIS — R918 Other nonspecific abnormal finding of lung field: Secondary | ICD-10-CM | POA: Diagnosis present

## 2021-07-28 DIAGNOSIS — I10 Essential (primary) hypertension: Secondary | ICD-10-CM | POA: Diagnosis not present

## 2021-07-28 DIAGNOSIS — I160 Hypertensive urgency: Secondary | ICD-10-CM | POA: Diagnosis not present

## 2021-07-28 LAB — CBC
HCT: 36.2 % (ref 36.0–46.0)
Hemoglobin: 11.7 g/dL — ABNORMAL LOW (ref 12.0–15.0)
MCH: 30.3 pg (ref 26.0–34.0)
MCHC: 32.3 g/dL (ref 30.0–36.0)
MCV: 93.8 fL (ref 80.0–100.0)
Platelets: 189 10*3/uL (ref 150–400)
RBC: 3.86 MIL/uL — ABNORMAL LOW (ref 3.87–5.11)
RDW: 13.8 % (ref 11.5–15.5)
WBC: 11.3 10*3/uL — ABNORMAL HIGH (ref 4.0–10.5)
nRBC: 0 % (ref 0.0–0.2)

## 2021-07-28 LAB — BASIC METABOLIC PANEL
Anion gap: 8 (ref 5–15)
BUN: 13 mg/dL (ref 8–23)
CO2: 33 mmol/L — ABNORMAL HIGH (ref 22–32)
Calcium: 9.4 mg/dL (ref 8.9–10.3)
Chloride: 97 mmol/L — ABNORMAL LOW (ref 98–111)
Creatinine, Ser: 1.13 mg/dL — ABNORMAL HIGH (ref 0.44–1.00)
GFR, Estimated: 49 mL/min — ABNORMAL LOW (ref >60)
Glucose, Bld: 90 mg/dL (ref 70–99)
Potassium: 4.1 mmol/L (ref 3.5–5.1)
Sodium: 138 mmol/L (ref 135–145)

## 2021-07-28 LAB — MAGNESIUM: Magnesium: 2 mg/dL (ref 1.7–2.4)

## 2021-07-28 MED ORDER — FUROSEMIDE 40 MG PO TABS
40.0000 mg | ORAL_TABLET | Freq: Every day | ORAL | Status: DC
  Administered 2021-07-28: 40 mg via ORAL
  Filled 2021-07-28: qty 1

## 2021-07-28 NOTE — Progress Notes (Signed)
Renal artery duplex has been completed.  ? ?Preliminary results in CV Proc.  ? ?Jermey Closs Helen Winterhalter ?07/28/2021 8:57 AM    ?

## 2021-07-28 NOTE — TOC Progression Note (Signed)
Transition of Care (TOC) - Progression Note  ? ? ?Patient Details  ?Name: Maria Navarro ?MRN: [id number] ?Date of Birth: 08/27/1940 ? ?Transition of Care (TOC) CM/SW Contact  ?Leone Haven, RN ?Phone Number: ?07/28/2021, 3:06 PM ? ?Clinical Narrative:    ?Patient is from home alone,  indep she has a cane and a walker that her husband used before. She states her support system is her neighbor , Fayne Mediate, who will transport her home at discharge.  IV lasix was changed to po, conts on 2 liters, she has no home oxygen.  Plan for dc over the weekend.  TOC will continue to follow for dc needs.  ? ? ?  ?  ? ?Expected Discharge Plan and Services ?  ?  ?  ?  ?  ?                ?  ?  ?  ?  ?  ?  ?  ?  ?  ?  ? ? ?Social Determinants of Health (SDOH) Interventions ?Food Insecurity Interventions: Intervention Not Indicated ?Financial Strain Interventions: Intervention Not Indicated ?Housing Interventions: Intervention Not Indicated ?Transportation Interventions: Intervention Not Indicated ? ?Readmission Risk Interventions ?   ? View : No data to display.  ?  ?  ?  ? ? ?

## 2021-07-28 NOTE — Progress Notes (Signed)
? ?Progress Note ? ?Patient Name: Maria Navarro ?Date of Encounter: 07/28/2021 ? ?[facility] Cardiologist: Jenean Lindau, MD  ? ?Subjective  ? ?Net -2.7 L yesterday, -3.2 L on admission.  BP 109/80.  Mild bump in creatinine (0.9 > 1.1).  Reports dyspnea improved. ? ?Inpatient Medications  ?  ?Scheduled Meds: ? amiodarone  200 mg Oral Daily  ? apixaban  5 mg Oral BID  ? buPROPion  300 mg Oral Daily  ? DULoxetine  30 mg Oral BID  ? furosemide  40 mg Oral Daily  ? hyoscyamine  0.375 mg Oral Q12H  ? metoprolol tartrate  50 mg Oral BID  ? pantoprazole  40 mg Oral Daily  ? sodium chloride flush  3 mL Intravenous Q12H  ? ?Continuous Infusions: ? sodium chloride    ? ?PRN Meds: ?sodium chloride, acetaminophen, ondansetron (ZOFRAN) IV, sodium chloride flush  ? ?Vital Signs  ?  ?Vitals:  ? 07/27/21 2001 07/28/21 0000 07/28/21 0400 07/28/21 0410  ?BP: (!) 116/59 115/62 109/80   ?Pulse: 65     ?Resp:  17    ?Temp: 98.3 ?F (36.8 ?C)  98.2 ?F (36.8 ?C)   ?TempSrc: Oral  Oral   ?SpO2: 98% 98%    ?Weight:    60.7 kg  ?Height:      ? ? ?Intake/Output Summary (Last 24 hours) at 07/28/2021 0910 ?Last data filed at 07/28/2021 0400 ?Gross per 24 hour  ?Intake 477 ml  ?Output 2250 ml  ?Net -1773 ml  ? ? ?  07/28/2021  ?  4:10 AM 07/27/2021  ? 10:57 AM 07/27/2021  ?  3:19 AM  ?Last 3 Weights  ?Weight (lbs) 133 lb 13.1 oz 133 lb 9.6 oz 135 lb  ?Weight (kg) 60.7 kg 60.601 kg 61.236 kg  ?   ? ?Telemetry  ?  ?Normal sinus rhythm rate 50s to 60s- Personally Reviewed ? ?ECG  ?  ?No new ECG- Personally Reviewed ? ?Physical Exam  ? ?GEN: No acute distress.   ?Neck: No JVD ?Cardiac: RRR, no murmurs, rubs, or gallops.  ?Respiratory: Clear to auscultation bilaterally. ?GI: Soft, nontender, non-distended  ?MS: No edema; No deformity. ?Neuro:  Nonfocal  ?Psych: Normal affect  ? ?Labs  ?  ?High Sensitivity Troponin:   ?Recent Labs  ?Lab 07/27/21 ?0103 07/27/21 ?0315  ?TROPONINIHS 18* 40*  ?   ?Chemistry ?Recent Labs  ?Lab 07/27/21 ?0103  07/27/21 ?[id number] 07/28/21 ?0403  ?NA 140 138 138  ?K 3.8 3.6 4.1  ?CL 102 99 97*  ?CO2 28 30 33*  ?GLUCOSE 166* 150* 90  ?BUN 14 12 13   ?CREATININE 0.96 0.94 1.13*  ?CALCIUM 9.6 8.9 9.4  ?MG  --  1.5* 2.0  ?PROT  --  6.2*  --   ?ALBUMIN  --  3.4*  --   ?AST  --  22  --   ?ALT  --  20  --   ?ALKPHOS  --  62  --   ?BILITOT  --  1.0  --   ?GFRNONAA >60 >60 49*  ?ANIONGAP 10 9 8   ?  ?Lipids No results for input(s): CHOL, TRIG, HDL, LABVLDL, LDLCALC, CHOLHDL in the last 168 hours.  ?Hematology ?Recent Labs  ?Lab 07/27/21 ?0013 07/27/21 ?[id number] 07/28/21 ?0403  ?WBC 14.0* 15.4* 11.3*  ?RBC 4.01 3.71* 3.86*  ?HGB 12.5 11.4* 11.7*  ?HCT 38.5 35.4* 36.2  ?MCV 96.0 95.4 93.8  ?MCH 31.2 30.7 30.3  ?MCHC 32.5 32.2 32.3  ?RDW 14.9 13.7  13.8  ?PLT 270 209 189  ? ?Thyroid No results for input(s): TSH, FREET4 in the last 168 hours.  ?BNP ?Recent Labs  ?Lab 07/27/21 ?0155  ?BNP 541.2*  ?  ?DDimer No results for input(s): DDIMER in the last 168 hours.  ? ?Radiology  ?  ?CT ABDOMEN PELVIS W CONTRAST ? ?Result Date: 07/27/2021 ?CLINICAL DATA:  Abdominal pain, acute, nonlocalized. Weakness, shortness of breath, and palpitations. EXAM: CT ABDOMEN AND PELVIS WITH CONTRAST TECHNIQUE: Multidetector CT imaging of the abdomen and pelvis was performed using the standard protocol following bolus administration of intravenous contrast. RADIATION DOSE REDUCTION: This exam was performed according to the departmental dose-optimization program which includes automated exposure control, adjustment of the mA and/or kV according to patient size and/or use of iterative reconstruction technique. CONTRAST:  119mL OMNIPAQUE IOHEXOL 300 MG/ML  SOLN COMPARISON:  08/29/2018. FINDINGS: Lower chest: The heart is enlarged and coronary artery calcifications are noted. There is a large hiatal hernia. Small bilateral pleural effusions are noted with patchy ground-glass opacities at the lung bases. Interlobular septal thickening is noted at the lung bases. Hepatobiliary:  No focal liver abnormality is seen. The common bile duct is prominent in size which is likely related to patient's post cholecystectomy status. Pancreas: Unremarkable. No pancreatic ductal dilatation or surrounding inflammatory changes. Spleen: Normal in size without focal abnormality. Adrenals/Urinary Tract: No adrenal nodule or mass. There is no renal calculus bilaterally. There is mild hydroureteronephrosis on the left which is unchanged from the prior exam. No obstructing lesion is identified. The bladder is unremarkable. Stomach/Bowel: There is a large hiatal hernia. No bowel obstruction, free air, or pneumatosis. A moderate amount of stool is present in the colon. The cecum crosses the midline and is located in the left lower quadrant. The appendix is not visualized on exam. Vascular/Lymphatic: Aortic atherosclerosis. No enlarged abdominal or pelvic lymph nodes. Reproductive: Uterus and bilateral adnexa are unremarkable. Other: No ascites. Musculoskeletal: Degenerative changes are present in the thoracolumbar spine. No acute osseous abnormality. IMPRESSION: 1. Patchy ground-glass opacities at the lung bases, possible edema versus pneumonitis. 2. Small bilateral pleural effusions. 3. Large hiatal hernia. 4. Cardiomegaly with coronary artery calcifications. 5. The cecum is identified in the left lower quadrant, possible mobile cecum. The appendix is not visualized on exam. 6. Aortic atherosclerosis. 7. Mild hydroureteronephrosis with no obstructing calculus. Electronically Signed   By: Brett Fairy M.D.   On: 07/27/2021 04:40  ? ?DG Chest Port 1 View ? ?Result Date: 07/27/2021 ?CLINICAL DATA:  Shortness of breath EXAM: PORTABLE CHEST 1 VIEW COMPARISON:  10/26/2020 FINDINGS: Cardiac shadow is enlarged but stable. Aortic calcifications are again seen. Persistent and increased density is noted in the right upper lobe. Nodular opacity is noted in the left upper lobe as well. Diffuse interstitial edema and vascular  congestion is noted. No bony abnormality is seen. Postsurgical changes in the cervical spine are noted. IMPRESSION: Changes consistent with CHF worse than that seen on the prior exam. There is increased right upper lobe density identified when compared with the prior exam suspicious for an enlarging lesion. Small nodular density on the left is noted as well. CT of the chest with contrast would be helpful for further evaluation. Electronically Signed   By: Inez Catalina M.D.   On: 07/27/2021 00:37  ? ?ECHOCARDIOGRAM COMPLETE ? ?Result Date: 07/27/2021 ?   ECHOCARDIOGRAM REPORT   Patient Name:   Maria Navarro Date of Exam: 07/27/2021 Medical Rec #:  [id number]  Height:       66.0 in Accession #:    [id number]    Weight:       133.6 lb Date of Birth:  [birth year]     BSA:          1.685 m? Patient Age:    81 years      BP:           114/57 mmHg Patient Gender: F             HR:           61 bpm. Exam Location:  Inpatient Procedure: 2D Echo, Cardiac Doppler and Color Doppler Indications:    I50.40* Unspecified combined systolic (congestive) and diastolic                 (congestive) heart failure  History:        Patient has prior history of Echocardiogram examinations, most                 recent 11/24/2020. CHF, Abnormal ECG, Aortic Valve Disease,                 Arrythmias:Atrial Fibrillation; Risk Factors:Hypertension and                 Dyslipidemia.  Sonographer:    Roseanna Rainbow RDCS Referring Phys: [place] Comments: Technically difficult study due to poor echo windows. IMPRESSIONS  1. Left ventricular ejection fraction, by estimation, is 60 to 65%. The left ventricle has normal function. The left ventricle has no regional wall motion abnormalities. Left ventricular diastolic parameters are consistent with Grade III diastolic dysfunction (restrictive). Elevated left atrial pressure.  2. Right ventricular systolic function is normal. The right ventricular size is normal. There is mildly elevated  pulmonary artery systolic pressure.  3. Left atrial size was mildly dilated.  4. Right atrial size was mildly dilated.  5. The mitral valve is normal in structure. Trivial mitral valve regurgitation. No evidence of mitra

## 2021-07-28 NOTE — Progress Notes (Addendum)
?PROGRESS NOTE ? ? ? Maria Navarro  [id number] DOB: February 01, 1941 DOA: 07/26/2021 ?PCP: Olive Bass, MD  ?Narrative 80/F with history of chronic A-fib on amiodarone, Eliquis, recent cardioversion, history of aortic stenosis presented to the ED with acute worsening of dyspnea yesterday 3/29.  She noticed dyspnea on exertion for 3 to 4 days, with some leg swelling. ?-Presented to the ED in respiratory distress, blood pressure was 218/86, imaging noted pulmonary edema and pleural effusions ? ? ?Subjective: Feels better overall, breathing is improving, abdominal discomfort is better as well ? ?Assessment and Plan: ? ?Acute hypoxic respiratory failure/Flash pulmonary edema ?Acute diastolic CHF  ?-Off BiPAP yesterday ?-Diuresed with IV Lasix she is 3.1 L negative ?-2D echo with preserved EF, grade 3 DD, restrictive pattern ?-Appreciate cardiology input, transition to Lasix p.o. today ?-Wean O2, increase activity ?-Discharge planning ? ?Malignant hypertension ?-Improving, off nitro gtt. ?-Continue metoprolol, Cardizem on hold ? ?Mild aortic stenosis ?-Stable ? ?Multiple lung nodules ?-Needs follow-up CT chest in few months ? ?Hypomagnesemia ?-Replaced ? ?P.Atrial fibrillation (HCC) ?-recent DCCV 3/28 ?On amiodarone-high-dose and eliquis. ?-Continue metoprolol ? ?GERD (gastroesophageal reflux disease) ?Hiatal hernia ?-Continue Protonix ?-Subacute/chronic abdominal discomfort, continue low-dose hyoscyamine, refer back to GI, CT unremarkable ? ?DVT prophylaxis: Eliquis ?Code Status: DNR ?Family Communication: Discussed with patient in detail, no family at bedside ?Disposition Plan: Home 1 to 2 days ? ?Consultants:  ?Cards ? ?Procedures:  ? ?Antimicrobials:  ? ? ?Objective: ?Vitals:  ? 07/28/21 0400 07/28/21 0410 07/28/21 0919 07/28/21 1137  ?BP: 109/80  124/62 120/74  ?Pulse:   62 (!) 57  ?Resp:   17 20  ?Temp: 98.2 ?F (36.8 ?C)  98.6 ?F (37 ?C) 98.3 ?F (36.8 ?C)  ?TempSrc: Oral  Oral Oral  ?SpO2:   100% 99%  ?Weight:   60.7 kg    ?Height:      ? ? ?Intake/Output Summary (Last 24 hours) at 07/28/2021 1243 ?Last data filed at 07/28/2021 1028 ?Gross per 24 hour  ?Intake 717 ml  ?Output 1200 ml  ?Net -483 ml  ? ?Filed Weights  ? 07/27/21 0319 07/27/21 1057 07/28/21 0410  ?Weight: 61.2 kg 60.6 kg 60.7 kg  ? ? ?Examination: ? ?General exam: Pleasant elderly female sitting up in bed, AAOx3, no distress ?HEENT: Positive JVD ?CVS: S1-S2, regular rhythm, systolic murmur ?Lungs: Few basilar rales, otherwise clear ?Abdomen: Soft, nontender, bowel sounds present ?Extremities: Trace edema  ?Skin: No rashes ?Psychiatry: Judgement and insight appear normal. Mood & affect appropriate.  ? ? ? ?Data Reviewed:  ? ?CBC: ?Recent Labs  ?Lab 07/27/21 ?0013 07/27/21 ?4259 07/28/21 ?0403  ?WBC 14.0* 15.4* 11.3*  ?NEUTROABS 11.4* 13.7*  --   ?HGB 12.5 11.4* 11.7*  ?HCT 38.5 35.4* 36.2  ?MCV 96.0 95.4 93.8  ?PLT 270 209 189  ? ?Basic Metabolic Panel: ?Recent Labs  ?Lab 07/27/21 ?0103 07/27/21 ?5638 07/28/21 ?0403  ?NA 140 138 138  ?K 3.8 3.6 4.1  ?CL 102 99 97*  ?CO2 28 30 33*  ?GLUCOSE 166* 150* 90  ?BUN 14 12 13   ?CREATININE 0.96 0.94 1.13*  ?CALCIUM 9.6 8.9 9.4  ?MG  --  1.5* 2.0  ? ?GFR: ?Estimated Creatinine Clearance: 37.8 mL/min (A) (by C-G formula based on SCr of 1.13 mg/dL (H)). ?Liver Function Tests: ?Recent Labs  ?Lab 07/27/21 ?7564  ?AST 22  ?ALT 20  ?ALKPHOS 62  ?BILITOT 1.0  ?PROT 6.2*  ?ALBUMIN 3.4*  ? ?No results for input(s): LIPASE, AMYLASE in the last 168 hours. ?  No results for input(s): AMMONIA in the last 168 hours. ?Coagulation Profile: ?No results for input(s): INR, PROTIME in the last 168 hours. ?Cardiac Enzymes: ?No results for input(s): CKTOTAL, CKMB, CKMBINDEX, TROPONINI in the last 168 hours. ?BNP (last 3 results) ?No results for input(s): PROBNP in the last 8760 hours. ?HbA1C: ?No results for input(s): HGBA1C in the last 72 hours. ?CBG: ?No results for input(s): GLUCAP in the last 168 hours. ?Lipid Profile: ?No results for  input(s): CHOL, HDL, LDLCALC, TRIG, CHOLHDL, LDLDIRECT in the last 72 hours. ?Thyroid Function Tests: ?No results for input(s): TSH, T4TOTAL, FREET4, T3FREE, THYROIDAB in the last 72 hours. ?Anemia Panel: ?No results for input(s): VITAMINB12, FOLATE, FERRITIN, TIBC, IRON, RETICCTPCT in the last 72 hours. ?Urine analysis: ?   ?Component Value Date/Time  ? [place] YELLOW 07/27/2021 0315  ? APPEARANCEUR CLEAR 07/27/2021 0315  ? LABSPEC 1.010 07/27/2021 0315  ? PHURINE 6.0 07/27/2021 0315  ? GLUCOSEU 100 (A) 07/27/2021 0315  ? HGBUR SMALL (A) 07/27/2021 0315  ? [place] NEGATIVE 07/27/2021 0315  ? [place] NEGATIVE 07/27/2021 0315  ? [place] NEGATIVE 07/27/2021 0315  ? NITRITE NEGATIVE 07/27/2021 0315  ? LEUKOCYTESUR TRACE (A) 07/27/2021 0315  ? ?Sepsis Labs: ?@LABRCNTIP (procalcitonin:4,lacticidven:4) ? ?) ?Recent Results (from the past 240 hour(s))  ?Resp Panel by RT-PCR (Flu A&B, Covid) Nasopharyngeal Swab     Status: None  ? Collection Time: 07/27/21 12:26 AM  ? Specimen: Nasopharyngeal Swab; Nasopharyngeal(NP) swabs in vial transport medium  ?Result Value Ref Range Status  ? SARS Coronavirus 2 by RT PCR NEGATIVE NEGATIVE Final  ?  Comment: (NOTE) ?SARS-CoV-2 target nucleic acids are NOT DETECTED. ? ?The SARS-CoV-2 RNA is generally detectable in upper respiratory ?specimens during the acute phase of infection. The lowest ?concentration of SARS-CoV-2 viral copies this assay can detect is ?138 copies/mL. A negative result does not preclude SARS-Cov-2 ?infection and should not be used as the sole basis for treatment or ?other patient management decisions. A negative result may occur with  ?improper specimen collection/handling, submission of specimen other ?than nasopharyngeal swab, presence of viral mutation(s) within the ?areas targeted by this assay, and inadequate number of viral ?copies(<138 copies/mL). A negative result must be combined with ?clinical observations, patient history, and  epidemiological ?information. The expected result is Negative. ? ?Fact Sheet for Patients:  ?EntrepreneurPulse.com.au ? ?Fact Sheet for Healthcare Providers:  ?IncredibleEmployment.be ? ?This test is no t yet approved or cleared by the [place] FDA and  ?has been authorized for detection and/or diagnosis of SARS-CoV-2 by ?FDA under an Emergency Use Authorization (EUA). This EUA will remain  ?in effect (meaning this test can be used) for the duration of the ?COVID-19 declaration under Section 564(b)(1) of the Act, 21 ?U.S.C.section 360bbb-3(b)(1), unless the authorization is terminated  ?or revoked sooner.  ? ? ?  ? Influenza A by PCR NEGATIVE NEGATIVE Final  ? Influenza B by PCR NEGATIVE NEGATIVE Final  ?  Comment: (NOTE) ?The Xpert Xpress SARS-CoV-2/FLU/RSV plus assay is intended as an aid ?in the diagnosis of influenza from Nasopharyngeal swab specimens and ?should not be used as a sole basis for treatment. Nasal washings and ?aspirates are unacceptable for Xpert Xpress SARS-CoV-2/FLU/RSV ?testing. ? ?Fact Sheet for Patients: ?EntrepreneurPulse.com.au ? ?Fact Sheet for Healthcare Providers: ?IncredibleEmployment.be ? ?This test is not yet approved or cleared by the [place] FDA and ?has been authorized for detection and/or diagnosis of SARS-CoV-2 by ?FDA under an Emergency Use Authorization (EUA). This EUA will remain ?in effect (meaning this test  can be used) for the duration of the ?COVID-19 declaration under Section 564(b)(1) of the Act, 21 U.S.C. ?section 360bbb-3(b)(1), unless the authorization is terminated or ?revoked. ? ?Performed at [facility], [place] [address], [place], [place] ?[address] ?  ?  ? ?Radiology Studies: ?CT ABDOMEN PELVIS W CONTRAST ? ?Result Date: 07/27/2021 ?CLINICAL DATA:  Abdominal pain, acute, nonlocalized. Weakness, shortness of breath, and palpitations. EXAM: CT ABDOMEN AND PELVIS WITH CONTRAST  TECHNIQUE: Multidetector CT imaging of the abdomen and pelvis was performed using the standard protocol following bolus administration of intravenous contrast. RADIATION DOSE REDUCTION: This exam was performed according to the departme

## 2021-07-28 NOTE — Progress Notes (Addendum)
0739:Contacted vascular ultrasound to get a time estimate of when patient will be going for RUS Duplex. No answer, left voice message.  ? ?44: Received call from Lupita Leash, Heart and Vascular stated they will be up around 8:30-0900 ?

## 2021-07-28 NOTE — Progress Notes (Signed)
Heart Failure Nurse Navigator Progress Note  ?Patient assessed for appropriateness for HF [facility].  I discussed with patient the HF [facility] clinic and reason for appointment.  She agrees to attend at least one appt and she tells me that she lives in [place] and prefers to not have to come to [place] for multiple outpatient appts going forward.  I delivered her the appt card with appt date and time as well reviewed brief directions to get to our Clinic.  Patient says that she has transportation to and from [employer]) and can afford medications at discharge. ?

## 2021-07-29 ENCOUNTER — Encounter (HOSPITAL_COMMUNITY): Payer: Self-pay | Admitting: Internal Medicine

## 2021-07-29 ENCOUNTER — Inpatient Hospital Stay (HOSPITAL_COMMUNITY): Payer: Medicare Other

## 2021-07-29 DIAGNOSIS — J810 Acute pulmonary edema: Secondary | ICD-10-CM

## 2021-07-29 DIAGNOSIS — I10 Essential (primary) hypertension: Secondary | ICD-10-CM | POA: Diagnosis not present

## 2021-07-29 DIAGNOSIS — I5031 Acute diastolic (congestive) heart failure: Secondary | ICD-10-CM | POA: Diagnosis not present

## 2021-07-29 DIAGNOSIS — E876 Hypokalemia: Secondary | ICD-10-CM

## 2021-07-29 LAB — BASIC METABOLIC PANEL
Anion gap: 11 (ref 5–15)
BUN: 17 mg/dL (ref 8–23)
CO2: 31 mmol/L (ref 22–32)
Calcium: 10 mg/dL (ref 8.9–10.3)
Chloride: 93 mmol/L — ABNORMAL LOW (ref 98–111)
Creatinine, Ser: 1.13 mg/dL — ABNORMAL HIGH (ref 0.44–1.00)
GFR, Estimated: 49 mL/min — ABNORMAL LOW (ref >60)
Glucose, Bld: 93 mg/dL (ref 70–99)
Potassium: 3.3 mmol/L — ABNORMAL LOW (ref 3.5–5.1)
Sodium: 135 mmol/L (ref 135–145)

## 2021-07-29 MED ORDER — POTASSIUM CHLORIDE CRYS ER 20 MEQ PO TBCR
40.0000 meq | EXTENDED_RELEASE_TABLET | Freq: Two times a day (BID) | ORAL | Status: AC
  Administered 2021-07-29 (×2): 40 meq via ORAL
  Filled 2021-07-29 (×2): qty 2

## 2021-07-29 MED ORDER — FUROSEMIDE 20 MG PO TABS
20.0000 mg | ORAL_TABLET | Freq: Every day | ORAL | Status: DC
Start: 2021-07-29 — End: 2021-07-29
  Administered 2021-07-29: 20 mg via ORAL
  Filled 2021-07-29: qty 1

## 2021-07-29 NOTE — Evaluation (Signed)
Physical Therapy Evaluation ?Patient Details ?Name: Maria Navarro ?MRN: [id number] ?DOB: 17-Mar-1941 ?Today's Date: 07/29/2021 ? ?History of Present Illness ? Pt is a 81 y.o. female who presented 07/26/21 with weakness, SOB, and palpitation. S/p cardioversion 3/28. Imaging revealed pulmonary edema and pleural effusions. PMH: chronic Afib, aortic stenosis, anemia, HTN, OA, osteoporosis ?  ?Clinical Impression ? Pt presents with condition above and deficits mentioned below, see PT Problem List. PTA, she was IND without DME, living alone in a 1-level house with 5-6 STE. Currently, pt is displaying deficits in balance and activity tolerance. Pt ambulated without UE support today, but reported being lightheaded when ambulating (see vitals below) and displayed a narrow stance with intermittent scissoring, staggering, and LOB bouts needing minA to recover. Educated pt to use her RW at home for improved safety with mobility as she is at risk for falls. Will continue to follow acutely. Recommending HHPT follow-up to address her deficits and reduce her risk for falls. ? ?SpO2 down to 87% on RA at rest, 84% on RA with mobility, 92% on 2L O2 mobilizing ? ?BP: ?117/56 sitting ?111/51 standing ?104/72 standing ~3 min ?   ? ?Recommendations for follow up therapy are one component of a multi-disciplinary discharge planning process, led by the attending physician.  Recommendations may be updated based on patient status, additional functional criteria and insurance authorization. ? ?Follow Up Recommendations Home health PT ? ?  ?Assistance Recommended at Discharge Intermittent Supervision/Assistance  ?Patient can return home with the following ? A little help with walking and/or transfers;A little help with bathing/dressing/bathroom;Assistance with cooking/housework;Assist for transportation;Help with stairs or ramp for entrance ? ?  ?Equipment Recommendations None recommended by PT (has a RW already per pt)  ?Recommendations for Other  Services ?    ?  ?Functional Status Assessment Patient has had a recent decline in their functional status and demonstrates the ability to make significant improvements in function in a reasonable and predictable amount of time.  ? ?  ?Precautions / Restrictions Precautions ?Precautions: Fall;Other (comment) ?Precaution Comments: watch SpO2 and BP ?Restrictions ?Weight Bearing Restrictions: No  ? ?  ? ?Mobility ? Bed Mobility ?  ?  ?  ?  ?  ?  ?  ?General bed mobility comments: Pt sitting up EOB upon arrival. ?  ? ?Transfers ?Overall transfer level: Needs assistance ?Equipment used: None ?Transfers: Sit to/from Stand ?Sit to Stand: Min guard ?  ?  ?  ?  ?  ?General transfer comment: Min guard for safety, no LOB. ?  ? ?Ambulation/Gait ?Ambulation/Gait assistance: Min guard, Min assist ?Gait Distance (Feet): 200 Feet ?Assistive device: None ?Gait Pattern/deviations: Step-through pattern, Decreased stride length, Narrow base of support, Staggering right, Staggering left, Scissoring ?Gait velocity: reduced ?Gait velocity interpretation: <1.8 ft/sec, indicate of risk for recurrent falls ?  ?General Gait Details: Pt with slow, unsteady gait with narrow stance and intermittent scissoring resulting in lateral staggering x4 LOB bouts, needing minA to recover. When not staggering, pt was able to ambulate with min guard assist. Educated pt to use her RW at home for safety at this time. ? ?Stairs ?  ?  ?  ?  ?  ? ?Wheelchair Mobility ?  ? ?Modified Rankin (Stroke Patients Only) ?  ? ?  ? ?Balance Overall balance assessment: Needs assistance ?Sitting-balance support: No upper extremity supported, Feet supported ?Sitting balance-Leahy Scale: Normal ?Sitting balance - Comments: reaches off BOS without difficulty ?  ?Standing balance support: No upper extremity supported, Reliant on  assistive device for balance ?Standing balance-Leahy Scale: Fair ?Standing balance comment: Able to stand without UE support but x4 LOB bouts needing  minA to recover when ambulating ?  ?  ?  ?  ?  ?  ?  ?  ?  ?  ?  ?   ? ? ? ?Pertinent Vitals/Pain Pain Assessment ?Pain Assessment: 0-10 ?Pain Score: 3  ?Pain Location: headache ?Pain Descriptors / Indicators: Discomfort, Constant ?Pain Intervention(s): Limited activity within patient's tolerance, Monitored during session, Repositioned  ? ? ?Home Living Family/patient expects to be discharged to:: Private residence ?Living Arrangements: Alone ?Available Help at Discharge: Friend(s);Available 24 hours/day ?Type of Home: House ?Home Access: Stairs to enter ?Entrance Stairs-Rails: Can reach both ?Entrance Stairs-Number of Steps: 5-6 ?  ?Home Layout: One level ?Home Equipment: Conservation officer, nature (2 wheels);Cane - single point ?   ?  ?Prior Function Prior Level of Function : Independent/Modified Independent;Driving ?  ?  ?  ?  ?  ?  ?Mobility Comments: Does not use AD. ?  ?  ? ? ?Hand Dominance  ? Dominant Hand: Right ? ?  ?Extremity/Trunk Assessment  ? Upper Extremity Assessment ?Upper Extremity Assessment: Overall WFL for tasks assessed (MMT scores of grossly 4+ to 5 bil; denies numbness/tingling bil) ?  ? ?Lower Extremity Assessment ?Lower Extremity Assessment: Overall WFL for tasks assessed (MMT scores of grossly 4+ to 5 bil; denies numbness/tingling bil) ?  ? ?Cervical / Trunk Assessment ?Cervical / Trunk Assessment: Normal  ?Communication  ? Communication: No difficulties  ?Cognition Arousal/Alertness: Awake/alert ?Behavior During Therapy: [facility] for tasks assessed/performed ?Overall Cognitive Status: Within Functional Limits for tasks assessed ?  ?  ?  ?  ?  ?  ?  ?  ?  ?  ?  ?  ?  ?  ?  ?  ?  ?  ?  ? ?  ?General Comments General comments (skin integrity, edema, etc.): SpO2 down to 87% on RA at rest, 84% on RA with mobility, 92% on 2L O2 mobilizing; reported lightheadedness when ambulating, BP 117/56 sitting, 111/51 standing, 104/72 standing ~3 min ? ?  ?Exercises    ? ?Assessment/Plan  ?  ?PT Assessment Patient needs  continued PT services  ?PT Problem List Decreased activity tolerance;Decreased balance;Decreased mobility;Cardiopulmonary status limiting activity ? ?   ?  ?PT Treatment Interventions DME instruction;Gait training;Stair training;Functional mobility training;Therapeutic activities;Therapeutic exercise;Balance training;Neuromuscular re-education;Patient/family education   ? ?PT Goals (Current goals can be found in the Care Plan section)  ?Acute Rehab PT Goals ?Patient Stated Goal: to get better ?PT Goal Formulation: With patient ?Time For Goal Achievement: 08/12/21 ?Potential to Achieve Goals: Good ? ?  ?Frequency Min 3X/week ?  ? ? ?Co-evaluation   ?  ?  ?  ?  ? ? ?  ?AM-PAC PT "6 Clicks" Mobility  ?Outcome Measure Help needed turning from your back to your side while in a flat bed without using bedrails?: None ?Help needed moving from lying on your back to sitting on the side of a flat bed without using bedrails?: None ?Help needed moving to and from a bed to a chair (including a wheelchair)?: A Little ?Help needed standing up from a chair using your arms (e.g., wheelchair or bedside chair)?: A Little ?Help needed to walk in hospital room?: A Little ?Help needed climbing 3-5 steps with a railing? : A Little ?6 Click Score: 20 ? ?  ?End of Session Equipment Utilized During Treatment: Gait belt;Oxygen ?Activity Tolerance: Patient tolerated  treatment well ?Patient left: in chair;with call bell/phone within reach ?Nurse Communication: Mobility status;Other (comment) (vitals) ?PT Visit Diagnosis: Unsteadiness on feet (R26.81);Other abnormalities of gait and mobility (R26.89);Difficulty in walking, not elsewhere classified (R26.2);Dizziness and giddiness (R42) ?  ? ?Time: [phone] ?PT Time Calculation (min) (ACUTE ONLY): 29 min ? ? ?Charges:   PT Evaluation ?$PT Eval Moderate Complexity: 1 Mod ?PT Treatments ?$Gait Training: 8-22 mins ?  ?   ? ? ?Moishe Spice, PT, DPT ?Acute Rehabilitation Services  ?Pager:  [phone] ?Office: [phone] ? ? ?Maretta Bees Pettis ?07/29/2021, 9:57 AM ? ?

## 2021-07-29 NOTE — Progress Notes (Signed)
Per NA, patient found without oxygen on. SpO2 in 70s. Patient placed on 2L nasal cannula, SpO2 99.  ?

## 2021-07-29 NOTE — Plan of Care (Signed)

## 2021-07-29 NOTE — Progress Notes (Signed)
Patient A&O x3, confusion on date (thought today was April 2nd) When asked about if she had a fall today in her room, patient stated yes. She stated she fell on her bottom and hit her head on the window sill. MD notified- patient on eliquis 5mg  BID. PERRLA, BP 124/54, SpO2 95% on 2L nasal cannula. When asked about if she saw any animals in her room, patient denied. She stated " at home I would see animals scurrying in my house and they would disappear".  ?

## 2021-07-29 NOTE — Progress Notes (Signed)
? ?Progress Note ? ?Patient Name: Maria Navarro ?Date of Encounter: 07/29/2021 ? ?[facility] Cardiologist: Jenean Lindau, MD  ? ?Subjective  ? ?Net -1.0 L yesterday, 4.2 L on admission.  BP 112/77.  Creatinine stable (0.9 > 1.1 > 1.1).  Reports dyspnea resolved.  Having lightheadedness. ? ?Inpatient Medications  ?  ?Scheduled Meds: ? amiodarone  200 mg Oral Daily  ? apixaban  5 mg Oral BID  ? buPROPion  300 mg Oral Daily  ? DULoxetine  30 mg Oral BID  ? furosemide  20 mg Oral Daily  ? hyoscyamine  0.375 mg Oral Q12H  ? metoprolol tartrate  50 mg Oral BID  ? pantoprazole  40 mg Oral Daily  ? potassium chloride  40 mEq Oral BID  ? sodium chloride flush  3 mL Intravenous Q12H  ? ?Continuous Infusions: ? sodium chloride    ? ?PRN Meds: ?sodium chloride, acetaminophen, ondansetron (ZOFRAN) IV, sodium chloride flush  ? ?Vital Signs  ?  ?Vitals:  ? 07/28/21 2353 07/28/21 2354 07/29/21 0400 07/29/21 0811  ?BP:   128/67 112/77  ?Pulse:  (!) 57 61 69  ?Resp: 16 14 13 19   ?Temp:   98.1 ?F (36.7 ?C) 98 ?F (36.7 ?C)  ?TempSrc:   Oral Oral  ?SpO2:  99% 97% 99%  ?Weight:   53.6 kg   ?Height:      ? ? ?Intake/Output Summary (Last 24 hours) at 07/29/2021 0953 ?Last data filed at 07/29/2021 0855 ?Gross per 24 hour  ?Intake 832 ml  ?Output 1700 ml  ?Net -868 ml  ? ? ? ?  07/29/2021  ?  4:00 AM 07/28/2021  ?  4:10 AM 07/27/2021  ? 10:57 AM  ?Last 3 Weights  ?Weight (lbs) 118 lb 2.7 oz 133 lb 13.1 oz 133 lb 9.6 oz  ?Weight (kg) 53.6 kg 60.7 kg 60.601 kg  ?   ? ?Telemetry  ?  ?Normal sinus rhythm rate 60s to 70s- Personally Reviewed ? ?ECG  ?  ?No new ECG- Personally Reviewed ? ?Physical Exam  ? ?GEN: No acute distress.   ?Neck: No JVD ?Cardiac: RRR, no murmurs, rubs, or gallops.  ?Respiratory: Clear to auscultation bilaterally. ?GI: Soft, nontender, non-distended  ?MS: No edema; No deformity. ?Neuro:  Nonfocal  ?Psych: Normal affect  ? ?Labs  ?  ?High Sensitivity Troponin:   ?Recent Labs  ?Lab 07/27/21 ?0103 07/27/21 ?0315  ?TROPONINIHS  18* 40*  ? ?   ?Chemistry ?Recent Labs  ?Lab 07/27/21 ?[id number] 07/28/21 ?0403 07/29/21 ?0340  ?NA 138 138 135  ?K 3.6 4.1 3.3*  ?CL 99 97* 93*  ?CO2 30 33* 31  ?GLUCOSE 150* 90 93  ?BUN 12 13 17   ?CREATININE 0.94 1.13* 1.13*  ?CALCIUM 8.9 9.4 10.0  ?MG 1.5* 2.0  --   ?PROT 6.2*  --   --   ?ALBUMIN 3.4*  --   --   ?AST 22  --   --   ?ALT 20  --   --   ?ALKPHOS 62  --   --   ?BILITOT 1.0  --   --   ?GFRNONAA >60 49* 49*  ?ANIONGAP 9 8 11   ? ?  ?Lipids No results for input(s): CHOL, TRIG, HDL, LABVLDL, LDLCALC, CHOLHDL in the last 168 hours.  ?Hematology ?Recent Labs  ?Lab 07/27/21 ?0013 07/27/21 ?[id number] 07/28/21 ?0403  ?WBC 14.0* 15.4* 11.3*  ?RBC 4.01 3.71* 3.86*  ?HGB 12.5 11.4* 11.7*  ?HCT 38.5 35.4* 36.2  ?MCV  96.0 95.4 93.8  ?MCH 31.2 30.7 30.3  ?MCHC 32.5 32.2 32.3  ?RDW 14.9 13.7 13.8  ?PLT 270 209 189  ? ? ?Thyroid No results for input(s): TSH, FREET4 in the last 168 hours.  ?BNP ?Recent Labs  ?Lab 07/27/21 ?0155  ?BNP 541.2*  ? ?  ?DDimer No results for input(s): DDIMER in the last 168 hours.  ? ?Radiology  ?  ?ECHOCARDIOGRAM COMPLETE ? ?Result Date: 07/27/2021 ?   ECHOCARDIOGRAM REPORT   Patient Name:   Maria Navarro Date of Exam: 07/27/2021 Medical Rec #:  [id number]     Height:       66.0 in Accession #:    [id number]    Weight:       133.6 lb Date of Birth:  Aug 18, 1940     BSA:          1.685 m? Patient Age:    81 years      BP:           114/57 mmHg Patient Gender: F             HR:           61 bpm. Exam Location:  Inpatient Procedure: 2D Echo, Cardiac Doppler and Color Doppler Indications:    I50.40* Unspecified combined systolic (congestive) and diastolic                 (congestive) heart failure  History:        Patient has prior history of Echocardiogram examinations, most                 recent 11/24/2020. CHF, Abnormal ECG, Aortic Valve Disease,                 Arrythmias:Atrial Fibrillation; Risk Factors:Hypertension and                 Dyslipidemia.  Sonographer:    Roseanna Rainbow RDCS Referring Phys: [place] Comments: Technically difficult study due to poor echo windows. IMPRESSIONS  1. Left ventricular ejection fraction, by estimation, is 60 to 65%. The left ventricle has normal function. The left ventricle has no regional wall motion abnormalities. Left ventricular diastolic parameters are consistent with Grade III diastolic dysfunction (restrictive). Elevated left atrial pressure.  2. Right ventricular systolic function is normal. The right ventricular size is normal. There is mildly elevated pulmonary artery systolic pressure.  3. Left atrial size was mildly dilated.  4. Right atrial size was mildly dilated.  5. The mitral valve is normal in structure. Trivial mitral valve regurgitation. No evidence of mitral stenosis.  6. The aortic valve is calcified. Aortic valve regurgitation is trivial. Mild aortic valve stenosis.  7. The inferior vena cava is dilated in size with >50% respiratory variability, suggesting right atrial pressure of 8 mmHg. FINDINGS  Left Ventricle: Left ventricular ejection fraction, by estimation, is 60 to 65%. The left ventricle has normal function. The left ventricle has no regional wall motion abnormalities. The left ventricular internal cavity size was normal in size. There is  no left ventricular hypertrophy. Left ventricular diastolic parameters are consistent with Grade III diastolic dysfunction (restrictive). Elevated left atrial pressure. Right Ventricle: The right ventricular size is normal. Right ventricular systolic function is normal. There is mildly elevated pulmonary artery systolic pressure. The tricuspid regurgitant velocity is 2.81 m/s, and with an assumed right atrial pressure of 8 mmHg, the estimated right ventricular systolic pressure is [id number] mmHg. Left Atrium: Left  atrial size was mildly dilated. Right Atrium: Right atrial size was mildly dilated. Pericardium: There is no evidence of pericardial effusion. Mitral Valve: The mitral valve is normal in  structure. Mild mitral annular calcification. Trivial mitral valve regurgitation. No evidence of mitral valve stenosis. Tricuspid Valve: The tricuspid valve is normal in structure. Tricuspid valve regurgitation is mild . No evidence of tricuspid stenosis. Aortic Valve: The aortic valve is calcified. Aortic valve regurgitation is trivial. Mild aortic stenosis is present. Aortic valve mean gradient measures 12.0 mmHg. Aortic valve peak gradient measures 22.5 mmHg. Aortic valve area, by VTI measures 2.79 cm?. Pulmonic Valve: The pulmonic valve was normal in structure. Pulmonic valve regurgitation is trivial. No evidence of pulmonic stenosis. Aorta: The aortic root is normal in size and structure. Venous: The inferior vena cava is dilated in size with greater than 50% respiratory variability, suggesting right atrial pressure of 8 mmHg. IAS/Shunts: No atrial level shunt detected by color flow Doppler.  LEFT VENTRICLE PLAX 2D LVIDd:         4.50 cm     Diastology LVIDs:         3.10 cm     LV e' medial:    4.90 cm/s LV PW:         1.10 cm     LV E/e' medial:  20.2 LV IVS:        0.70 cm     LV e' lateral:   3.43 cm/s LVOT diam:     2.30 cm     LV E/e' lateral: 28.9 LV SV:         144 LV SV Index:   85 LVOT Area:     4.15 cm?  LV Volumes (MOD) LV vol d, MOD A2C: 86.8 ml LV vol d, MOD A4C: 67.3 ml LV vol s, MOD A2C: 34.3 ml LV vol s, MOD A4C: 19.4 ml LV SV MOD A2C:     52.5 ml LV SV MOD A4C:     67.3 ml LV SV MOD BP:      50.3 ml RIGHT VENTRICLE            IVC RV S prime:     8.76 cm/s  IVC diam: 2.10 cm TAPSE (M-mode): 1.5 cm LEFT ATRIUM             Index        RIGHT ATRIUM           Index LA diam:        4.70 cm 2.79 cm/m?   RA Area:     20.30 cm? LA Vol (A2C):   47.9 ml 28.43 ml/m?  RA Volume:   55.50 ml  32.94 ml/m? LA Vol (A4C):   69.5 ml 41.25 ml/m? LA Biplane Vol: 63.4 ml 37.63 ml/m?  AORTIC VALVE AV Area (Vmax):    2.87 cm? AV Area (Vmean):   2.63 cm? AV Area (VTI):     2.79 cm? AV Vmax:           237.33 cm/s AV  Vmean:          162.667 cm/s AV VTI:            0.516 m AV Peak Grad:      22.5 mmHg AV Mean Grad:      12.0 mmHg LVOT Vmax:         164.00 cm/s LVOT Vmean:        103.000 cm/s LVOT VTI:  0.346 m LVOT

## 2021-07-29 NOTE — Progress Notes (Addendum)
?PROGRESS NOTE ? ? ? Maria Navarro  [id number] DOB: [birth year] DOA: 07/26/2021 ?PCP: Algis Greenhouse, MD  ?Narrative 80/F with history of chronic A-fib on amiodarone, Eliquis, recent cardioversion, history of aortic stenosis presented to the ED with acute worsening of dyspnea yesterday 3/29.  She noticed dyspnea on exertion for 3 to 4 days, with some leg swelling. ?-Presented to the ED in respiratory distress, blood pressure was 218/86, imaging noted pulmonary edema and pleural effusions ? ? ?Subjective: Lightheaded and dizzy this morning, feels better now, denies any shortness of breath ? ?Assessment and Plan: ? ?Acute hypoxic respiratory failure/Flash pulmonary edema ?Acute diastolic CHF  ?-Off BiPAP yesterday ?-Diuresed with IV Lasix she is 4.1 L negative ?-2D echo with preserved EF, grade 3 DD, restrictive pattern ?-Appreciate cardiology input, hold diuretics with dizziness today, check orthostatics ?-Wean O2, increase activity ?-Discharge planning, home tomorrow on low-dose diuretics if stable ? ?Malignant hypertension ?-Improving, off nitro gtt. ?-Continue metoprolol, Cardizem on hold ? ?Mild aortic stenosis ?-Stable ? ?Multiple lung nodules ?-Needs follow-up CT chest in few months ? ?Hypokalemia ?Hypomagnesemia ?-Replace ? ?P.Atrial fibrillation ([place]) ?-recent DCCV 3/28 ?On amiodarone-high-dose and eliquis. ?-Continue metoprolol ? ?GERD (gastroesophageal reflux disease) ?Hiatal hernia ?-Continue Protonix ?-Subacute/chronic abdominal discomfort, continue low-dose hyoscyamine, refer back to GI, CT unremarkable ? ?DVT prophylaxis: Eliquis ?Code Status: DNR ?Family Communication: Discussed with patient in detail, no family at bedside ?Disposition Plan: Home tomorrow if stable ? ?Consultants:  ?Cards ? ?Procedures:  ? ?Antimicrobials:  ? ? ?Objective: ?Vitals:  ? 07/28/21 2353 07/28/21 2354 07/29/21 0400 07/29/21 0811  ?BP:   128/67 112/77  ?Pulse:  (!) 57 61 69  ?Resp: 16 14 13 19   ?Temp:   98.1 ?F (36.7 ?C)  98 ?F (36.7 ?C)  ?TempSrc:   Oral Oral  ?SpO2:  99% 97% 99%  ?Weight:   53.6 kg   ?Height:      ? ? ?Intake/Output Summary (Last 24 hours) at 07/29/2021 1147 ?Last data filed at 07/29/2021 1040 ?Gross per 24 hour  ?Intake 592 ml  ?Output 1800 ml  ?Net -1208 ml  ? ?Filed Weights  ? 07/27/21 1057 07/28/21 0410 07/29/21 0400  ?Weight: 60.6 kg 60.7 kg 53.6 kg  ? ? ?Examination: ? ?General exam: Pleasant elderly female sitting up in the recliner, AAOx3, no distress ?HEENT: No JVD ?CVS: S1-S2, regular rhythm, systolic murmur ?Lungs: Poor air movement otherwise clear ?Abdomen: Soft, nontender, bowel sounds present ?Extremities: No edema  ?Skin: No rashes ?Psychiatry: Judgement and insight appear normal. Mood & affect appropriate.  ? ? ? ?Data Reviewed:  ? ?CBC: ?Recent Labs  ?Lab 07/27/21 ?0013 07/27/21 ?[id number] 07/28/21 ?0403  ?WBC 14.0* 15.4* 11.3*  ?NEUTROABS 11.4* 13.7*  --   ?HGB 12.5 11.4* 11.7*  ?HCT 38.5 35.4* 36.2  ?MCV 96.0 95.4 93.8  ?PLT 270 209 189  ? ?Basic Metabolic Panel: ?Recent Labs  ?Lab 07/27/21 ?0103 07/27/21 ?[id number] 07/28/21 ?0403 07/29/21 ?0340  ?NA 140 138 138 135  ?K 3.8 3.6 4.1 3.3*  ?CL 102 99 97* 93*  ?CO2 28 30 33* 31  ?GLUCOSE 166* 150* 90 93  ?BUN 14 12 13 17   ?CREATININE 0.96 0.94 1.13* 1.13*  ?CALCIUM 9.6 8.9 9.4 10.0  ?MG  --  1.5* 2.0  --   ? ?GFR: ?Estimated Creatinine Clearance: 34.2 mL/min (A) (by C-G formula based on SCr of 1.13 mg/dL (H)). ?Liver Function Tests: ?Recent Labs  ?Lab 07/27/21 ?[id number]  ?AST 22  ?ALT 20  ?ALKPHOS 62  ?  BILITOT 1.0  ?PROT 6.2*  ?ALBUMIN 3.4*  ? ?No results for input(s): LIPASE, AMYLASE in the last 168 hours. ?No results for input(s): AMMONIA in the last 168 hours. ?Coagulation Profile: ?No results for input(s): INR, PROTIME in the last 168 hours. ?Cardiac Enzymes: ?No results for input(s): CKTOTAL, CKMB, CKMBINDEX, TROPONINI in the last 168 hours. ?BNP (last 3 results) ?No results for input(s): PROBNP in the last 8760 hours. ?HbA1C: ?No results for input(s): HGBA1C in  the last 72 hours. ?CBG: ?No results for input(s): GLUCAP in the last 168 hours. ?Lipid Profile: ?No results for input(s): CHOL, HDL, LDLCALC, TRIG, CHOLHDL, LDLDIRECT in the last 72 hours. ?Thyroid Function Tests: ?No results for input(s): TSH, T4TOTAL, FREET4, T3FREE, THYROIDAB in the last 72 hours. ?Anemia Panel: ?No results for input(s): VITAMINB12, FOLATE, FERRITIN, TIBC, IRON, RETICCTPCT in the last 72 hours. ?Urine analysis: ?   ?Component Value Date/Time  ? [place] YELLOW 07/27/2021 0315  ? APPEARANCEUR CLEAR 07/27/2021 0315  ? LABSPEC 1.010 07/27/2021 0315  ? PHURINE 6.0 07/27/2021 0315  ? GLUCOSEU 100 (A) 07/27/2021 0315  ? HGBUR SMALL (A) 07/27/2021 0315  ? [place] NEGATIVE 07/27/2021 0315  ? [place] NEGATIVE 07/27/2021 0315  ? [place] NEGATIVE 07/27/2021 0315  ? NITRITE NEGATIVE 07/27/2021 0315  ? LEUKOCYTESUR TRACE (A) 07/27/2021 0315  ? ?Sepsis Labs: ?@LABRCNTIP (procalcitonin:4,lacticidven:4) ? ?) ?Recent Results (from the past 240 hour(s))  ?Resp Panel by RT-PCR (Flu A&B, Covid) Nasopharyngeal Swab     Status: None  ? Collection Time: 07/27/21 12:26 AM  ? Specimen: Nasopharyngeal Swab; Nasopharyngeal(NP) swabs in vial transport medium  ?Result Value Ref Range Status  ? SARS Coronavirus 2 by RT PCR NEGATIVE NEGATIVE Final  ?  Comment: (NOTE) ?SARS-CoV-2 target nucleic acids are NOT DETECTED. ? ?The SARS-CoV-2 RNA is generally detectable in upper respiratory ?specimens during the acute phase of infection. The lowest ?concentration of SARS-CoV-2 viral copies this assay can detect is ?138 copies/mL. A negative result does not preclude SARS-Cov-2 ?infection and should not be used as the sole basis for treatment or ?other patient management decisions. A negative result may occur with  ?improper specimen collection/handling, submission of specimen other ?than nasopharyngeal swab, presence of viral mutation(s) within the ?areas targeted by this assay, and inadequate number of viral ?copies(<138  copies/mL). A negative result must be combined with ?clinical observations, patient history, and epidemiological ?information. The expected result is Negative. ? ?Fact Sheet for Patients:  ?EntrepreneurPulse.com.au ? ?Fact Sheet for Healthcare Providers:  ?IncredibleEmployment.be ? ?This test is no t yet approved or cleared by the [place] FDA and  ?has been authorized for detection and/or diagnosis of SARS-CoV-2 by ?FDA under an Emergency Use Authorization (EUA). This EUA will remain  ?in effect (meaning this test can be used) for the duration of the ?COVID-19 declaration under Section 564(b)(1) of the Act, 21 ?U.S.C.section 360bbb-3(b)(1), unless the authorization is terminated  ?or revoked sooner.  ? ? ?  ? Influenza A by PCR NEGATIVE NEGATIVE Final  ? Influenza B by PCR NEGATIVE NEGATIVE Final  ?  Comment: (NOTE) ?The Xpert Xpress SARS-CoV-2/FLU/RSV plus assay is intended as an aid ?in the diagnosis of influenza from Nasopharyngeal swab specimens and ?should not be used as a sole basis for treatment. Nasal washings and ?aspirates are unacceptable for Xpert Xpress SARS-CoV-2/FLU/RSV ?testing. ? ?Fact Sheet for Patients: ?EntrepreneurPulse.com.au ? ?Fact Sheet for Healthcare Providers: ?IncredibleEmployment.be ? ?This test is not yet approved or cleared by the [place] and ?has been authorized for detection  and/or diagnosis of SARS-CoV-2 by ?FDA under an Emergency Use Authorization (EUA). This EUA will remain ?in effect (meaning this test can be used) for the duration of the ?COVID-19 declaration under Section 564(b)(1) of the Act, 21 U.S.C. ?section 360bbb-3(b)(1), unless the authorization is terminated or ?revoked. ? ?Performed at [facility], [place] [address], [place], [place] ?[address] ?  ?  ? ?Radiology Studies: ?DG CHEST PORT 1 VIEW ? ?Result Date: 07/29/2021 ?CLINICAL DATA:  Shortness of breath, weakness and  hypertension. EXAM: PORTABLE CHEST 1 VIEW COMPARISON:  07/27/2021 FINDINGS: Cardiac enlargement unchanged. Aortic atherosclerosis. Interval in resolution scratch set interval resolution of pulmonary edema. Residual

## 2021-07-29 NOTE — Progress Notes (Signed)
SATURATION QUALIFICATIONS: (This note is used to comply with regulatory documentation for home oxygen) ? ?Patient Saturations on Room Air at Rest = 87% ? ?Patient Saturations on Room Air while Ambulating = 84% ? ?Patient Saturations on 2 Liters of oxygen while Ambulating = 92% ? ?Please briefly explain why patient needs home oxygen: Pt's SpO2 levels decrease on RA at rest and with mobility. ? ? ?Moishe Spice, PT, DPT ?Acute Rehabilitation Services  ?Pager: [phone] ?Office: [phone] ? ?

## 2021-07-29 NOTE — Progress Notes (Addendum)
When nurse walked into room, patient oxygen tubing was out of her nose. She was instructed to place O2 tubing back in nose. Patient asked nurse at bedside if she could go see the dog on the counter. Nurse informed patient that there is not a dog on counter. Patient answered admission questions appropriately. MD made aware.  ?

## 2021-07-29 NOTE — Progress Notes (Signed)
Mobility Specialist Progress Note  ? ? 07/29/21 1635  ?Mobility  ?Activity Ambulated with assistance in hallway  ?Level of Assistance Contact guard assist, steadying assist  ?Assistive Device Front wheel walker  ?Distance Ambulated (ft) 300 ft  ?Activity Response Tolerated fair  ?$Mobility charge 1 Mobility  ? ?Pt received standing in the corner of the room bending down to look for something. Assisted pt to sit EOB while I retrieved her [place] and placed back on her. Pt stated that about 15 minutes prior to my arrival she was looking for something in her room with her own pink fuzzy socks on and she slipped and hit her head falling backwards on the windowsill. Pt assured me she was fine and asked me not to tell the RN.  ? ?Agreeable to ambulation. Ambulated on 2LO2. Pt had posterior lean and impulsive with her turns. Pt turned and hit door on way out. PT slow with scissoring gait. Returned to bed with call bell in reach and alarm on. Pt stated she was seeing little critters scurry around her room and disappear. RN notified.  ? ?Hildred Alamin ?Mobility Specialist  ?  ?

## 2021-07-30 DIAGNOSIS — I5031 Acute diastolic (congestive) heart failure: Secondary | ICD-10-CM | POA: Diagnosis not present

## 2021-07-30 DIAGNOSIS — J9601 Acute respiratory failure with hypoxia: Secondary | ICD-10-CM | POA: Diagnosis not present

## 2021-07-30 DIAGNOSIS — I10 Essential (primary) hypertension: Secondary | ICD-10-CM | POA: Diagnosis not present

## 2021-07-30 DIAGNOSIS — F23 Brief psychotic disorder: Secondary | ICD-10-CM

## 2021-07-30 DIAGNOSIS — F05 Delirium due to known physiological condition: Secondary | ICD-10-CM

## 2021-07-30 DIAGNOSIS — R410 Disorientation, unspecified: Secondary | ICD-10-CM

## 2021-07-30 DIAGNOSIS — I4819 Other persistent atrial fibrillation: Secondary | ICD-10-CM | POA: Diagnosis not present

## 2021-07-30 LAB — URINALYSIS, ROUTINE W REFLEX MICROSCOPIC
Bilirubin Urine: NEGATIVE
Glucose, UA: NEGATIVE mg/dL
Ketones, ur: NEGATIVE mg/dL
Nitrite: NEGATIVE
Protein, ur: 30 mg/dL — AB
Specific Gravity, Urine: 1.012 (ref 1.005–1.030)
WBC, UA: >50 WBC/hpf — ABNORMAL HIGH (ref 0–5)
pH: 6 (ref 5.0–8.0)

## 2021-07-30 LAB — BASIC METABOLIC PANEL
Anion gap: 9 (ref 5–15)
BUN: 17 mg/dL (ref 8–23)
CO2: 29 mmol/L (ref 22–32)
Calcium: 9.7 mg/dL (ref 8.9–10.3)
Chloride: 101 mmol/L (ref 98–111)
Creatinine, Ser: 1.04 mg/dL — ABNORMAL HIGH (ref 0.44–1.00)
GFR, Estimated: 55 mL/min — ABNORMAL LOW (ref >60)
Glucose, Bld: 105 mg/dL — ABNORMAL HIGH (ref 70–99)
Potassium: 4.1 mmol/L (ref 3.5–5.1)
Sodium: 139 mmol/L (ref 135–145)

## 2021-07-30 MED ORDER — QUETIAPINE FUMARATE 50 MG PO TABS: 50.0000 mg | ORAL_TABLET | Freq: Every day | ORAL | Status: DC

## 2021-07-30 MED ORDER — ASENAPINE MALEATE 5 MG SL SUBL
5.0000 mg | SUBLINGUAL_TABLET | Freq: Every day | SUBLINGUAL | Status: DC | PRN
  Administered 2021-07-30: 5 mg via SUBLINGUAL
  Filled 2021-07-30 (×3): qty 1

## 2021-07-30 MED ORDER — BUPROPION HCL ER (XL) 150 MG PO TB24
150.0000 mg | ORAL_TABLET | Freq: Every day | ORAL | Status: DC
  Administered 2021-07-31 – 2021-08-01 (×2): 150 mg via ORAL
  Filled 2021-07-30 (×2): qty 1

## 2021-07-30 MED ORDER — QUETIAPINE FUMARATE 25 MG PO TABS: 25.0000 mg | ORAL_TABLET | Freq: Two times a day (BID) | ORAL | Status: DC

## 2021-07-30 MED ORDER — SODIUM CHLORIDE 0.9 % IV SOLN
1.0000 g | INTRAVENOUS | Status: DC
  Administered 2021-07-30 – 2021-07-31 (×2): 1 g via INTRAVENOUS
  Filled 2021-07-30 (×2): qty 10
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
## 2021-07-30 NOTE — Progress Notes (Signed)
PT Cancellation Note ? ?Patient Details ?Name: Maria Navarro ?MRN: [id number] ?DOB: [birth year] ? ? ?Cancelled Treatment:    Reason Eval/Treat Not Completed: Other (comment). RN requesting hold on PT treatment at this time as pt is easily agitated and impulsively unsafe. Will plan to follow-up later as time permits. ? ?Maria Navarro, PT, DPT ?Acute Rehabilitation Services  ?Pager: [phone] ?Office: [phone] ? ? ? ?Maria Navarro ?07/30/2021, 1:58 PM ? ? ?

## 2021-07-30 NOTE — Consult Note (Addendum)
[facility] Face-to-Face Psychiatry Consult  ? ?Reason for Consult:'' confusion and made some suicidal comments while confused.'' ?Referring Physician:  Domenic Polite ?Patient Identification: Maria Navarro ?MRN:  [id number] ?Principal Diagnosis: Delirium due to medical condition with behavioral disturbance ?Diagnosis:  Principal Problem: ?  Delirium due to medical condition with behavioral disturbance ?Active Problems: ?  GERD (gastroesophageal reflux disease) ?  Atrial fibrillation ([place]) ?  Acquired thrombophilia ([place]) ?  Acute diastolic CHF (congestive heart failure) ([place]) ?  Malignant hypertension ?  Acute pulmonary edema ([facility]) ?  Acute respiratory failure with hypoxia ([place]) ?  Pulmonary nodules ?  Hypokalemia ?  Hypomagnesemia ?  Brief psychotic disorder ([place]) ? ? ?Total Time spent with patient: 1 hour ? ?Subjective:   ?Maria Navarro is a 81 y.o. female patient admitted with acute dyspnea on exertion. ? ?HPI:  Patient is a 80/F with history of Major depression, chronic A-fib on amiodarone, Eliquis, recent cardioversion, history of aortic stenosis who was admitted to the hospital yesterday due to acute  dyspnea on exertion . Patient reports being stable prior to this admission, lives alone with her dog and able to complete all activities of daily living such as cooking, cleaning and taking care of her finance. Psychiatric consult was requested after patient made some suicidal comments while confused. Patient vehemently denies suicidal or homicidal intent, ideas or plan. ''I do not want to hurt myself or anyone but I was angry and tired of being in the hospital and taking all kind of medications, I do not want to die, I love my family and my dog.'' ?However, patient reports visual hallucinations stated that she saw about 4 dogs outside her hospital room window this morning. She also reports that she saw her cousin in her room. According to the support staff, patient reportedly had a difficult night, poor sleep and was  combative. Per chart review, hospital course is complicated by delirium, confusion and hallucinations. ?  ? ?Past Psychiatric History: as above ? ?Risk to Self:  denies ?Risk to Others:  denies ?Prior Inpatient Therapy:  none ?Prior Outpatient Therapy:  yes ? ?Past Medical History:  ?Past Medical History:  ?Diagnosis Date  ? Allergic rhinitis 08/10/2015  ? Anemia 11/15/2020  ? Formatting of this note might be different from the original. 10/2020: incidental HGB 11.1, MCV 96, RDW 14  ? Aortic valve sclerosis 05/14/2019  ? Formatting of this note might be different from the original. 06/02/2020: ECHO, mild  ? Atrial fibrillation ([place]) 10/26/2020  ? Formatting of this note might be different from the original. 10/26/2020: noted pre-op by anesthesia, rate 118, to ED  ? Benign hypertension 08/18/2015  ? Chronic bilateral low back pain without sciatica 05/30/2020  ? Family history of premature coronary artery disease 04/14/2018  ? GERD (gastroesophageal reflux disease) 08/10/2015  ? Lumbar disc disease 05/30/2020  ? Mixed hyperlipidemia 10/03/2015  ? 2018: declined rx 2019: declinded rx  ? Osteoarthritis 08/10/2015  ? Osteoporosis 10/03/2015  ? 2008: -1.5 2017: -2.5 hip  Formatting of this note might be different from the original. 2008: -1.5 2017: -2.5 hip 2021: -2.6  ? Pain syndrome, chronic 08/10/2015  ? (2004) DX/RX: NSAID (2006) DX: NSAID Gastric ulcer with esoph stricture  (2006) RX: tramadol  ? Plantar fasciitis 08/18/2016  ? Recurrent major depressive disorder, in remission ([place]) 08/10/2015  ? Screening for diabetes mellitus (DM) 10/03/2015  ?  ?Past Surgical History:  ?Procedure Laterality Date  ? APPENDECTOMY    ? CARDIOVERSION  12/29/2020  ?  CARDIOVERSION N/A 07/25/2021  ? Procedure: CARDIOVERSION;  Surgeon: Werner Lean, MD;  Location: [facility];  Service: Cardiovascular;  Laterality: N/A;  ? CHOLECYSTECTOMY    ? FOOT SURGERY    ? Hernia repaired    ? ?Family History:  ?Family History  ?Family history unknown: Yes   ? ?Family Psychiatric  History: none ?Social History:  ?Social History  ? ?Substance and Sexual Activity  ?Alcohol Use Not Currently  ?   ?Social History  ? ?Substance and Sexual Activity  ?Drug Use Never  ?  ?Social History  ? ?Socioeconomic History  ? Marital status: Widowed  ?  Spouse name: Not on file  ? Number of children: 3  ? Years of education: Not on file  ? Highest education level: 11th grade  ?Occupational History  ? Occupation: Retired  ?Tobacco Use  ? Smoking status: Never  ? Smokeless tobacco: Never  ?Vaping Use  ? Vaping Use: Never used  ?Substance and Sexual Activity  ? Alcohol use: Not Currently  ? Drug use: Never  ? Sexual activity: Not on file  ?Other Topics Concern  ? Not on file  ?Social History Narrative  ? Not on file  ? ?Social Determinants of Health  ? ?Financial Resource Strain: Low Risk   ? Difficulty of Paying Living Expenses: Not hard at all  ?Food Insecurity: No Food Insecurity  ? Worried About Charity fundraiser in the Last Year: Never true  ? Ran Out of Food in the Last Year: Never true  ?Transportation Needs: No Transportation Needs  ? Lack of Transportation (Medical): No  ? Lack of Transportation (Non-Medical): No  ?Physical Activity: Not on file  ?Stress: Not on file  ?Social Connections: Not on file  ? ?Additional Social History: ?  ? ?Allergies:   ?Allergies  ?Allergen Reactions  ? Escitalopram Other (See Comments)  ?  sleepy  ? ? ?Labs:  ?Results for orders placed or performed during the hospital encounter of 07/26/21 (from the past 48 hour(s))  ?Basic metabolic panel     Status: Abnormal  ? Collection Time: 07/29/21  3:40 AM  ?Result Value Ref Range  ? Sodium 135 135 - 145 mmol/L  ? Potassium 3.3 (L) 3.5 - 5.1 mmol/L  ?  Comment: DELTA CHECK NOTED  ? Chloride 93 (L) 98 - 111 mmol/L  ? CO2 31 22 - 32 mmol/L  ? Glucose, Bld 93 70 - 99 mg/dL  ?  Comment: Glucose reference range applies only to samples taken after fasting for at least 8 hours.  ? BUN 17 8 - 23 mg/dL  ?  Creatinine, Ser 1.13 (H) 0.44 - 1.00 mg/dL  ? Calcium 10.0 8.9 - 10.3 mg/dL  ? GFR, Estimated 49 (L) >60 mL/min  ?  Comment: (NOTE) ?Calculated using the CKD-EPI Creatinine Equation (2021) ?  ? Anion gap 11 5 - 15  ?  Comment: Performed at [facility], [place] [address], [place], [place] [address]  ?Basic metabolic panel     Status: Abnormal  ? Collection Time: 07/30/21  7:04 AM  ?Result Value Ref Range  ? Sodium 139 135 - 145 mmol/L  ? Potassium 4.1 3.5 - 5.1 mmol/L  ? Chloride 101 98 - 111 mmol/L  ? CO2 29 22 - 32 mmol/L  ? Glucose, Bld 105 (H) 70 - 99 mg/dL  ?  Comment: Glucose reference range applies only to samples taken after fasting for at least 8 hours.  ? BUN 17 8 -  23 mg/dL  ? Creatinine, Ser 1.04 (H) 0.44 - 1.00 mg/dL  ? Calcium 9.7 8.9 - 10.3 mg/dL  ? GFR, Estimated 55 (L) >60 mL/min  ?  Comment: (NOTE) ?Calculated using the CKD-EPI Creatinine Equation (2021) ?  ? Anion gap 9 5 - 15  ?  Comment: Performed at [facility], [place] [address] [place], [place] [address]  ? ? ?Current Facility-Administered Medications  ?Medication Dose Route Frequency Provider Last Rate Last Admin  ? 0.9 %  sodium chloride infusion  250 mL Intravenous PRN Kristopher Oppenheim, DO      ? acetaminophen (TYLENOL) tablet 650 mg  650 mg Oral Q4H PRN Kristopher Oppenheim, DO   650 mg at 07/29/21 2249  ? amiodarone (PACERONE) tablet 200 mg  200 mg Oral Daily Donato Heinz, MD   200 mg at 07/30/21 1029  ? apixaban (ELIQUIS) tablet 5 mg  5 mg Oral BID Domenic Polite, MD   5 mg at 07/30/21 1029  ? [START ON 07/31/2021] buPROPion (WELLBUTRIN XL) 24 hr tablet 150 mg  150 mg Oral Daily Tysen Roesler, MD      ? DULoxetine (CYMBALTA) DR capsule 30 mg  30 mg Oral BID Domenic Polite, MD   30 mg at 07/30/21 1029  ? metoprolol tartrate (LOPRESSOR) tablet 50 mg  50 mg Oral BID Kristopher Oppenheim, DO   50 mg at 07/30/21 1029  ? ondansetron (ZOFRAN) injection 4 mg  4 mg Intravenous Q6H PRN Kristopher Oppenheim, DO   4 mg at 07/27/21 [id number]  ? pantoprazole (PROTONIX)  EC tablet 40 mg  40 mg Oral Daily Domenic Polite, MD   40 mg at 07/30/21 1029  ? [START ON 07/31/2021] QUEtiapine (SEROQUEL) tablet 50 mg  50 mg Oral QHS Tamera Pingley, MD      ? sodium chloride flush (NS) 0.9 % in
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
## 2021-07-30 NOTE — Progress Notes (Signed)
Mobility Specialist Progress Note  ? ? 07/30/21 1555  ?Mobility  ?Activity Contraindicated/medical hold  ? ?RN advised to hold off d/t pt cognitive status. Will f/u as appropriate.  ? ?Maria Navarro ?Mobility Specialist  ?  ?

## 2021-07-30 NOTE — Plan of Care (Signed)
  Problem: Safety: Goal: Non-violent Restraint(s) Outcome: Progressing   

## 2021-07-30 NOTE — Progress Notes (Signed)
?PROGRESS NOTE ? ? ? Maria Navarro  [id number] DOB: March 25, 1941 DOA: 07/26/2021 ?PCP: Algis Greenhouse, MD  ?Narrative 80/F with history of chronic A-fib on amiodarone, Eliquis, recent cardioversion, history of aortic stenosis presented to the ED with acute worsening of dyspnea yesterday 3/29.  She noticed dyspnea on exertion for 3 to 4 days, with some leg swelling. ?-Presented to the ED in respiratory distress, blood pressure was 218/86, imaging noted pulmonary edema and pleural effusions. ?-Improved with diuresis ?-Hospital course complicated by delirium, confusion ? ? ?Subjective: Breathing better, yesterday evening developed hallucinations, some confusion this morning as well ? ?Assessment and Plan: ? ?Acute hypoxic respiratory failure/Flash pulmonary edema ?Acute diastolic CHF  ?-Required BiPAP on admission, then off ?-Diuresed with IV Lasix she is 3.7 L negative ?-2D echo with preserved EF, grade 3 DD, restrictive pattern ?-Appreciate cardiology input, diuretics held yesterday with dizziness, borderline orthostasis ?-Discussed with cardiology will change Lasix to 20 Mg every other day at discharge ?-Wean O2, increase activity ?-Now with confusion ? ?Delirium, confusion ?-Patient developed hallucinations yesterday evening, no history of EtOH use ?-Suspect hospital delirium, try reorienting measures, sitter if needed ?-Hold off on antipsychotics unless she develops severe agitation ?-Check urinalysis ? ?Malignant hypertension ?-Improved, off nitro gtt. ?-Continue metoprolol, Cardizem on hold ? ?Mild aortic stenosis ?-Stable ? ?Multiple lung nodules ?-Needs follow-up CT chest in few months ? ?Hypokalemia ?Hypomagnesemia ?-Replaced ? ?P.Atrial fibrillation ([place]) ?-recent DCCV 3/28 ?On amiodarone-high-dose and eliquis. ?-Continue metoprolol ? ?GERD (gastroesophageal reflux disease) ?Hiatal hernia ?-Continue Protonix ?-Subacute/chronic abdominal discomfort, improved on low-dose hyoscyamine, refer back to GI, CT  unremarkable ?-Hold hyoscyamine today with mental status changes ? ?DVT prophylaxis: Eliquis ?Code Status: DNR ?Family Communication: Called and updated patient's son ?Disposition Plan: Home pending improvement in mental status ? ?Consultants:  ?Cards ? ?Procedures:  ? ?Antimicrobials:  ? ? ?Objective: ?Vitals:  ? 07/30/21 0000 07/30/21 0408 07/30/21 0718 07/30/21 1100  ?BP: 135/62 (!) 125/48 (!) 135/59 135/67  ?Pulse: 67 66 85 81  ?Resp: 20 19 18    ?Temp: 98.4 ?F (36.9 ?C) 98.4 ?F (36.9 ?C) 98.1 ?F (36.7 ?C) 98.2 ?F (36.8 ?C)  ?TempSrc: Oral Oral Oral Oral  ?SpO2: 98% 99% 100% 93%  ?Weight:  57.3 kg    ?Height:      ? ? ?Intake/Output Summary (Last 24 hours) at 07/30/2021 1142 ?Last data filed at 07/30/2021 [phone] ?Gross per 24 hour  ?Intake 727.64 ml  ?Output 650 ml  ?Net 77.64 ml  ? ?Filed Weights  ? 07/28/21 0410 07/29/21 0400 07/30/21 0408  ?Weight: 60.7 kg 53.6 kg 57.3 kg  ? ? ?Examination: ? ?General exam: Pleasant elderly female sitting up in bed, wrist restraints on, awake alert oriented to self place and time, somewhat confused this morning, hallucinating about birds and other family members being in the room ?HEENT: No JVD ?CVS: S1-S2, regular rate rhythm, systolic murmur ?Lungs: Poor air movement bilaterally ?Abdomen: Soft, nontender, bowel sounds present ?Extremities: No edema ?Skin: No rashes ?Psychiatry: Poor insight and judgment mood & affect appropriate.  ? ? ? ?Data Reviewed:  ? ?CBC: ?Recent Labs  ?Lab 07/27/21 ?0013 07/27/21 ?[id number] 07/28/21 ?0403  ?WBC 14.0* 15.4* 11.3*  ?NEUTROABS 11.4* 13.7*  --   ?HGB 12.5 11.4* 11.7*  ?HCT 38.5 35.4* 36.2  ?MCV 96.0 95.4 93.8  ?PLT 270 209 189  ? ?Basic Metabolic Panel: ?Recent Labs  ?Lab 07/27/21 ?0103 07/27/21 ?[id number] 07/28/21 ?0403 07/29/21 ?[id number] 07/30/21 ?0704  ?NA 140 138 138 135 139  ?K  3.8 3.6 4.1 3.3* 4.1  ?CL 102 99 97* 93* 101  ?CO2 28 30 33* 31 29  ?GLUCOSE 166* 150* 90 93 105*  ?BUN 14 12 13 17 17   ?CREATININE 0.96 0.94 1.13* 1.13* 1.04*  ?CALCIUM 9.6 8.9  9.4 10.0 9.7  ?MG  --  1.5* 2.0  --   --   ? ?GFR: ?Estimated Creatinine Clearance: 39.7 mL/min (A) (by C-G formula based on SCr of 1.04 mg/dL (H)). ?Liver Function Tests: ?Recent Labs  ?Lab 07/27/21 ?2248  ?AST 22  ?ALT 20  ?ALKPHOS 62  ?BILITOT 1.0  ?PROT 6.2*  ?ALBUMIN 3.4*  ? ?No results for input(s): LIPASE, AMYLASE in the last 168 hours. ?No results for input(s): AMMONIA in the last 168 hours. ?Coagulation Profile: ?No results for input(s): INR, PROTIME in the last 168 hours. ?Cardiac Enzymes: ?No results for input(s): CKTOTAL, CKMB, CKMBINDEX, TROPONINI in the last 168 hours. ?BNP (last 3 results) ?No results for input(s): PROBNP in the last 8760 hours. ?HbA1C: ?No results for input(s): HGBA1C in the last 72 hours. ?CBG: ?No results for input(s): GLUCAP in the last 168 hours. ?Lipid Profile: ?No results for input(s): CHOL, HDL, LDLCALC, TRIG, CHOLHDL, LDLDIRECT in the last 72 hours. ?Thyroid Function Tests: ?No results for input(s): TSH, T4TOTAL, FREET4, T3FREE, THYROIDAB in the last 72 hours. ?Anemia Panel: ?No results for input(s): VITAMINB12, FOLATE, FERRITIN, TIBC, IRON, RETICCTPCT in the last 72 hours. ?Urine analysis: ?   ?Component Value Date/Time  ? COLORURINE YELLOW 07/27/2021 0315  ? APPEARANCEUR CLEAR 07/27/2021 0315  ? LABSPEC 1.010 07/27/2021 0315  ? PHURINE 6.0 07/27/2021 0315  ? GLUCOSEU 100 (A) 07/27/2021 0315  ? HGBUR SMALL (A) 07/27/2021 0315  ? BILIRUBINUR NEGATIVE 07/27/2021 0315  ? KETONESUR NEGATIVE 07/27/2021 0315  ? PROTEINUR NEGATIVE 07/27/2021 0315  ? NITRITE NEGATIVE 07/27/2021 0315  ? LEUKOCYTESUR TRACE (A) 07/27/2021 0315  ? ?Sepsis Labs: ?@LABRCNTIP (procalcitonin:4,lacticidven:4) ? ?) ?Recent Results (from the past 240 hour(s))  ?Resp Panel by RT-PCR (Flu A&B, Covid) Nasopharyngeal Swab     Status: None  ? Collection Time: 07/27/21 12:26 AM  ? Specimen: Nasopharyngeal Swab; Nasopharyngeal(NP) swabs in vial transport medium  ?Result Value Ref Range Status  ? SARS Coronavirus 2 by  RT PCR NEGATIVE NEGATIVE Final  ?  Comment: (NOTE) ?SARS-CoV-2 target nucleic acids are NOT DETECTED. ? ?The SARS-CoV-2 RNA is generally detectable in upper respiratory ?specimens during the acute phase of infection. The lowest ?concentration of SARS-CoV-2 viral copies this assay can detect is ?138 copies/mL. A negative result does not preclude SARS-Cov-2 ?infection and should not be used as the sole basis for treatment or ?other patient management decisions. A negative result may occur with  ?improper specimen collection/handling, submission of specimen other ?than nasopharyngeal swab, presence of viral mutation(s) within the ?areas targeted by this assay, and inadequate number of viral ?copies(<138 copies/mL). A negative result must be combined with ?clinical observations, patient history, and epidemiological ?information. The expected result is Negative. ? ?Fact Sheet for Patients:  ?BloggerCourse.com ? ?Fact Sheet for Healthcare Providers:  ?SeriousBroker.it ? ?This test is no t yet approved or cleared by the [place] FDA and  ?has been authorized for detection and/or diagnosis of SARS-CoV-2 by ?FDA under an Emergency Use Authorization (EUA). This EUA will remain  ?in effect (meaning this test can be used) for the duration of the ?COVID-19 declaration under Section 564(b)(1) of the Act, 21 ?U.S.C.section 360bbb-3(b)(1), unless the authorization is terminated  ?or revoked sooner.  ? ? ?  ?  Influenza A by PCR NEGATIVE NEGATIVE Final  ? Influenza B by PCR NEGATIVE NEGATIVE Final  ?  Comment: (NOTE) ?The Xpert Xpress SARS-CoV-2/FLU/RSV plus assay is intended as an aid ?in the diagnosis of influenza from Nasopharyngeal swab specimens and ?should not be used as a sole basis for treatment. Nasal washings and ?aspirates are unacceptable for Xpert Xpress SARS-CoV-2/FLU/RSV ?testing. ? ?Fact Sheet for Patients: ?EntrepreneurPulse.com.au ? ?Fact Sheet  for Healthcare Providers: ?IncredibleEmployment.be ? ?This test is not yet approved or cleared by the [place] and ?has been authorized for detection and/or diagnosis of SARS-Co

## 2021-07-30 NOTE — Progress Notes (Addendum)
? ?Progress Note ? ?Patient Name: Maria Navarro ?Date of Encounter: 07/30/2021 ? ?[facility] Cardiologist: Jenean Lindau, MD  ? ?Subjective  ? ?BP 135/59.  Creatinine 1.0.  She has been confused.  Had a fall yesterday, hit her head on windowsill.  Head CT was unremarkable.  She is currently alert and oriented x3.  Denies any chest pain or dyspnea. ? ?Inpatient Medications  ?  ?Scheduled Meds: ? amiodarone  200 mg Oral Daily  ? apixaban  5 mg Oral BID  ? buPROPion  300 mg Oral Daily  ? DULoxetine  30 mg Oral BID  ? hyoscyamine  0.375 mg Oral Q12H  ? metoprolol tartrate  50 mg Oral BID  ? pantoprazole  40 mg Oral Daily  ? sodium chloride flush  3 mL Intravenous Q12H  ? ?Continuous Infusions: ? sodium chloride    ? ?PRN Meds: ?sodium chloride, acetaminophen, ondansetron (ZOFRAN) IV, sodium chloride flush  ? ?Vital Signs  ?  ?Vitals:  ? 07/29/21 1935 07/30/21 0000 07/30/21 0408 07/30/21 [id number]  ?BP: (!) 98/54 135/62 (!) 125/48 (!) 135/59  ?Pulse: 85 67 66 85  ?Resp: 18 20 19    ?Temp: 98.2 ?F (36.8 ?C) 98.4 ?F (36.9 ?C) 98.4 ?F (36.9 ?C) 98.1 ?F (36.7 ?C)  ?TempSrc: Oral Oral Oral Oral  ?SpO2: 94% 98% 99% 100%  ?Weight:   57.3 kg   ?Height:      ? ? ?Intake/Output Summary (Last 24 hours) at 07/30/2021 1012 ?Last data filed at 07/30/2021 [id number] ?Gross per 24 hour  ?Intake 720 ml  ?Output 700 ml  ?Net 20 ml  ? ? ? ?  07/30/2021  ?  4:08 AM 07/29/2021  ?  4:00 AM 07/28/2021  ?  4:10 AM  ?Last 3 Weights  ?Weight (lbs) 126 lb 5.2 oz 118 lb 2.7 oz 133 lb 13.1 oz  ?Weight (kg) 57.3 kg 53.6 kg 60.7 kg  ?   ? ?Telemetry  ?  ?Normal sinus rhythm rate 60s to 70s- Personally Reviewed ? ?ECG  ?  ?No new ECG- Personally Reviewed ? ?Physical Exam  ? ?GEN: No acute distress.   ?Neck: No JVD ?Cardiac: RRR, no murmurs, rubs, or gallops.  ?Respiratory: Clear to auscultation bilaterally. ?GI: Soft, nontender, non-distended  ?MS: No edema; No deformity. ?Neuro:  Nonfocal  ?Psych: Normal affect  ? ?Labs  ?  ?High Sensitivity Troponin:   ?Recent Labs   ?Lab 07/27/21 ?0103 07/27/21 ?0315  ?TROPONINIHS 18* 40*  ? ?   ?Chemistry ?Recent Labs  ?Lab 07/27/21 ?[id number] 07/28/21 ?0403 07/29/21 ?[id number] 07/30/21 ?0704  ?NA 138 138 135 139  ?K 3.6 4.1 3.3* 4.1  ?CL 99 97* 93* 101  ?CO2 30 33* 31 29  ?GLUCOSE 150* 90 93 105*  ?BUN 12 13 17 17   ?CREATININE 0.94 1.13* 1.13* 1.04*  ?CALCIUM 8.9 9.4 10.0 9.7  ?MG 1.5* 2.0  --   --   ?PROT 6.2*  --   --   --   ?ALBUMIN 3.4*  --   --   --   ?AST 22  --   --   --   ?ALT 20  --   --   --   ?ALKPHOS 62  --   --   --   ?BILITOT 1.0  --   --   --   ?GFRNONAA >60 49* 49* 55*  ?ANIONGAP 9 8 11 9   ? ?  ?Lipids No results for input(s): CHOL, TRIG, HDL, LABVLDL, LDLCALC, CHOLHDL  in the last 168 hours.  ?Hematology ?Recent Labs  ?Lab 07/27/21 ?0013 07/27/21 ?[id number] 07/28/21 ?0403  ?WBC 14.0* 15.4* 11.3*  ?RBC 4.01 3.71* 3.86*  ?HGB 12.5 11.4* 11.7*  ?HCT 38.5 35.4* 36.2  ?MCV 96.0 95.4 93.8  ?MCH 31.2 30.7 30.3  ?MCHC 32.5 32.2 32.3  ?RDW 14.9 13.7 13.8  ?PLT 270 209 189  ? ? ?Thyroid No results for input(s): TSH, FREET4 in the last 168 hours.  ?BNP ?Recent Labs  ?Lab 07/27/21 ?0155  ?BNP 541.2*  ? ?  ?DDimer No results for input(s): DDIMER in the last 168 hours.  ? ?Radiology  ?  ?CT HEAD WO CONTRAST (5MM) ? ?Result Date: 07/29/2021 ?CLINICAL DATA:  Altered mental status EXAM: CT HEAD WITHOUT CONTRAST TECHNIQUE: Contiguous axial images were obtained from the base of the skull through the vertex without intravenous contrast. RADIATION DOSE REDUCTION: This exam was performed according to the departmental dose-optimization program which includes automated exposure control, adjustment of the mA and/or kV according to patient size and/or use of iterative reconstruction technique. COMPARISON:  None. FINDINGS: Brain: Normal anatomic configuration. Parenchymal volume loss is commensurate with the patient's age. Mild periventricular white matter changes are present likely reflecting the sequela of small vessel ischemia. No abnormal intra or extra-axial mass  lesion or fluid collection. No abnormal mass effect or midline shift. No evidence of acute intracranial hemorrhage or infarct. Ventricular size is normal. Cerebellum unremarkable. Vascular: No asymmetric hyperdense vasculature at the skull base. Skull: Intact Sinuses/Orbits: Paranasal sinuses are clear. Ocular lenses have been removed. Orbits are otherwise unremarkable. Other: Mastoid air cells and middle ear cavities are clear. IMPRESSION: No acute intracranial abnormality.  Mild senescent change. Electronically Signed   By: Fidela Salisbury M.D.   On: 07/29/2021 22:23  ? ?DG CHEST PORT 1 VIEW ? ?Result Date: 07/29/2021 ?CLINICAL DATA:  Shortness of breath, weakness and hypertension. EXAM: PORTABLE CHEST 1 VIEW COMPARISON:  07/27/2021 FINDINGS: Cardiac enlargement unchanged. Aortic atherosclerosis. Interval in resolution scratch set interval resolution of pulmonary edema. Residual patchy opacities are identified within both upper lobes, also improved in the interval. IMPRESSION: 1. Interval resolution of pulmonary edema. 2. Improved aeration to both upper lobes. Electronically Signed   By: Kerby Moors M.D.   On: 07/29/2021 11:43   ? ?Cardiac Studies  ? ?Echo 07/27/21: ? 1. Left ventricular ejection fraction, by estimation, is 60 to 65%. The  ?left ventricle has normal function. The left ventricle has no regional  ?wall motion abnormalities. Left ventricular diastolic parameters are  ?consistent with Grade III diastolic  ?dysfunction (restrictive). Elevated left atrial pressure.  ? 2. Right ventricular systolic function is normal. The right ventricular  ?size is normal. There is mildly elevated pulmonary artery systolic  ?pressure.  ? 3. Left atrial size was mildly dilated.  ? 4. Right atrial size was mildly dilated.  ? 5. The mitral valve is normal in structure. Trivial mitral valve  ?regurgitation. No evidence of mitral stenosis.  ? 6. The aortic valve is calcified. Aortic valve regurgitation is trivial.  ?Mild  aortic valve stenosis.  ? 7. The inferior vena cava is dilated in size with >50% respiratory  ?variability, suggesting right atrial pressure of 8 mmHg.  ? ?Renal artery duplex 07/28/21: ?Right: Abnormal right Resistive Index. 1-59% stenosis of the right  ?       renal artery. Normal size right kidney. No evidence of right  ?       renal artery stenosis.  ?Left:  Abnormal left  Resisitve Index. 1-59% stenosis of the left  ?       renal artery. Normal size of left kidney.  ?Mesenteric:  ?Normal Celiac artery and Superior Mesenteric artery findings.  ? ?Patient Profile  ?   ?81 y.o. female with a hx of persistent Afib, HTN, GERD, HLD, DM, anemia, mild AS, arthritis who is being seen 07/27/2021 for the evaluation of CHF ? ?Assessment & Plan  ?  ?Acute hypoxic respiratory failure 2/2 flash pulmonary edema/diastolic heart failure: Reports that she had increasing lower extremity edema and worsening shortness of breath.  BNP 541, chest x-ray with bilateral effusions on admission.  She has been diuresed with IV Lasix.  Echo shows normal biventricular function, grade 3 diastolic dysfunction, elevated left atrial pressure, RAP 8 mmHg ?--net -3.8L, weaned from BiPAP to room air ?--IV nitro weaned and DC'd ?--Appears euvolemic.  She is having lightheadedness with standing, suspect overdiuresis.  Discontinued Lasix ?  ?Malignant hypertension: Blood pressures noted to be greater than [id number] systolic on admission.  She reports compliance with her home medications which include beta-blocker and calcium channel blocker. ?--Required IV nitro which has been weaned and DC'd ?--On metoprolol 50 mg twice daily with stable blood pressures.  Heart rate noted in the 50-60 range.   ?-- Home diltiazem 240 mg daily held  ?  ?Persistent atrial fibrillation: Initially started on amiodarone with taper and underwent outpatient cardioversion 3/28 with successful restoration of sinus rhythm. ?--Maintaining sinus rhythm ?--Continue amiodarone 200 mg daily,  Eliquis 5 mg twice daily ?  ?Aortic stenosis: Mild on echo 3/30 ?  ?Abnormal chest x-ray finding: Noted above, recommendations for CT chest.  Though she had a CT chest on 3/21 showing multiple bilateral

## 2021-07-30 NOTE — Progress Notes (Signed)
Patient has been mildly agitated, no physical violence. Now patient is resting. We have a sitter available in the room. ?

## 2021-07-30 NOTE — Progress Notes (Signed)
Pt in no resp distress at this time requiring bipap.  Pt also in restraints.  RT will cont to monitor. ?

## 2021-07-31 DIAGNOSIS — J9601 Acute respiratory failure with hypoxia: Secondary | ICD-10-CM | POA: Diagnosis not present

## 2021-07-31 DIAGNOSIS — I5031 Acute diastolic (congestive) heart failure: Secondary | ICD-10-CM | POA: Diagnosis not present

## 2021-07-31 DIAGNOSIS — I4819 Other persistent atrial fibrillation: Secondary | ICD-10-CM | POA: Diagnosis not present

## 2021-07-31 LAB — CBC
HCT: 45.4 % (ref 36.0–46.0)
Hemoglobin: 14.3 g/dL (ref 12.0–15.0)
MCH: 30.4 pg (ref 26.0–34.0)
MCHC: 31.5 g/dL (ref 30.0–36.0)
MCV: 96.6 fL (ref 80.0–100.0)
Platelets: 271 10*3/uL (ref 150–400)
RBC: 4.7 MIL/uL (ref 3.87–5.11)
RDW: 13.5 % (ref 11.5–15.5)
WBC: 11.4 10*3/uL — ABNORMAL HIGH (ref 4.0–10.5)
nRBC: 0 % (ref 0.0–0.2)

## 2021-07-31 LAB — BASIC METABOLIC PANEL
Anion gap: 12 (ref 5–15)
BUN: 15 mg/dL (ref 8–23)
CO2: 24 mmol/L (ref 22–32)
Calcium: 10 mg/dL (ref 8.9–10.3)
Chloride: 104 mmol/L (ref 98–111)
Creatinine, Ser: 0.93 mg/dL (ref 0.44–1.00)
GFR, Estimated: >60 mL/min (ref >60)
Glucose, Bld: 113 mg/dL — ABNORMAL HIGH (ref 70–99)
Potassium: 3.8 mmol/L (ref 3.5–5.1)
Sodium: 140 mmol/L (ref 135–145)

## 2021-07-31 MED ORDER — FUROSEMIDE 20 MG PO TABS
20.0000 mg | ORAL_TABLET | ORAL | Status: DC
  Administered 2021-07-31: 20 mg via ORAL
  Filled 2021-07-31: qty 1

## 2021-07-31 MED ORDER — QUETIAPINE FUMARATE 25 MG PO TABS
25.0000 mg | ORAL_TABLET | Freq: Every day | ORAL | Status: DC
  Administered 2021-07-31: 25 mg via ORAL
  Filled 2021-07-31: qty 1

## 2021-07-31 NOTE — Progress Notes (Signed)
OT Cancellation Note ? ?Patient Details ?Name: Maria Navarro ?MRN: [id number] ?DOB: 03/16/1941 ? ? ?Cancelled Treatment:    Reason Eval/Treat Not Completed: Other (comment) (Pt fast asleep with sitter and best friend at bedside. Sitter reporting pt did not sleep well last night and she just fell asleep. Hold to allow pt to rest and sleep. Will return as schedule allows.) ? ?Aaric Dolph M Deyani Hegarty ?Neita Landrigan MSOT, OTR/L ?Acute Rehab ?Pager: [phone] ?Office: [phone] ?07/31/2021, 10:36 AM ?

## 2021-07-31 NOTE — Plan of Care (Signed)
°  Problem: Education: °Goal: Ability to demonstrate management of disease process will improve °Outcome: Progressing °  °Problem: Education: °Goal: Ability to verbalize understanding of medication therapies will improve °Outcome: Progressing °  °Problem: Education: °Goal: Individualized Educational Video(s) °Outcome: Progressing °  °Problem: Activity: °Goal: Capacity to carry out activities will improve °Outcome: Progressing °  °

## 2021-07-31 NOTE — TOC Progression Note (Signed)
Transition of Care (TOC) - Progression Note  ? ? ?Patient Details  ?Name: Maria Navarro ?MRN: [id number] ?Date of Birth: 1940/08/28 ? ?Transition of Care (TOC) CM/SW Contact  ?Leone Haven, RN ?Phone Number: ?07/31/2021, 5:56 PM ? ?Clinical Narrative:    ?From home alone, NCM offered choice, she does not have a preference.  NCM made referral to [facility] with [facility], HHPT.  He is able to take referral. Soc will begin 24 to 48 hrs post dc.  She states her friend will transport her home at dc.  ? ? ?  ?  ? ?Expected Discharge Plan and Services ?  ?  ?  ?  ?  ?                ?  ?  ?  ?  ?  ?  ?  ?  ?  ?  ? ? ?Social Determinants of Health (SDOH) Interventions ?Food Insecurity Interventions: Intervention Not Indicated ?Financial Strain Interventions: Intervention Not Indicated ?Housing Interventions: Intervention Not Indicated ?Transportation Interventions: Intervention Not Indicated ? ?Readmission Risk Interventions ?   ? View : No data to display.  ?  ?  ?  ? ? ?

## 2021-07-31 NOTE — Progress Notes (Signed)
?PROGRESS NOTE ? ? ? Maria Navarro  [id number] DOB: [birth year] DOA: 07/26/2021 ?PCP: Algis Greenhouse, MD  ?Narrative 80/F with history of chronic A-fib on amiodarone, Eliquis, recent cardioversion, history of aortic stenosis presented to the ED with acute worsening of dyspnea yesterday 3/29.  She noticed dyspnea on exertion for 3 to 4 days, with some leg swelling. ?-Presented to the ED in respiratory distress, blood pressure was 218/86, imaging noted pulmonary edema and pleural effusions. ?-Improved with diuresis ?-Hospital course complicated by delirium, confusion ? ? ?Subjective: Breathing better, yesterday evening developed hallucinations, some confusion this morning as well ? ?Assessment and Plan: ? ?Acute hypoxic respiratory failure/Flash pulmonary edema ?Acute diastolic CHF  ?-Required BiPAP on admission, then off ?-Diuresed with IV Lasix she is 3.7 L negative ?-2D echo with preserved EF, grade 3 DD, restrictive pattern ?-Appreciate cardiology input, diuretics held 4/1 with dizziness, borderline orthostasis ?-Discussed with cardiology will change Lasix to 20 Mg every other day at discharge ?-Wean O2, increase activity ?-PT OT, confusion improving ? ?Delirium, confusion ?-Patient developed hallucinations 4/1 evening, no history of EtOH use ?-Suspect hospital delirium, try reorienting measures, sitter if needed ?-UA abnormal, no symptoms of UTI, standard antibiotics in case this could be contributing ?-Appreciate psych input, started on Seroquel every afternoon and as needed Saphris ?-Appears to be improving, PT OT ? ?Malignant hypertension ?-Improved, off nitro gtt. ?-Continue metoprolol, Cardizem on hold ? ?Mild aortic stenosis ?-Stable ? ?Multiple lung nodules ?-Needs follow-up CT chest in few months ? ?Hypokalemia ?Hypomagnesemia ?-Replaced ? ?P.Atrial fibrillation ([place]) ?-recent DCCV 3/28 ?On amiodarone-high-dose and eliquis. ?-Continue metoprolol ? ?GERD (gastroesophageal reflux disease) ?Hiatal  hernia ?-Continue Protonix ?-Subacute/chronic abdominal discomfort, improved on low-dose hyoscyamine, refer back to GI, CT unremarkable ? ?DVT prophylaxis: Eliquis ?Code Status: DNR ?Family Communication: Called and updated patient's son yesterday ?Disposition Plan: Home pending improvement in mental status ? ?Consultants:  ?Cards ? ?Procedures:  ? ?Antimicrobials:  ? ? ?Objective: ?Vitals:  ? 07/31/21 0023 07/31/21 0503 07/31/21 0800 07/31/21 0909  ?BP: (!) 174/66 (!) 174/68 (!) 148/85 (!) 158/76  ?Pulse: 63 63 70 66  ?Resp: 18 18 16    ?Temp: 98.4 ?F (36.9 ?C) 98 ?F (36.7 ?C) 97.9 ?F (36.6 ?C)   ?TempSrc: Oral Oral Oral   ?SpO2: 94% 94% 95%   ?Weight:  56 kg    ?Height:      ? ? ?Intake/Output Summary (Last 24 hours) at 07/31/2021 1332 ?Last data filed at 07/31/2021 0800 ?Gross per 24 hour  ?Intake 460 ml  ?Output 800 ml  ?Net -340 ml  ? ?Filed Weights  ? 07/29/21 0400 07/30/21 0408 07/31/21 0503  ?Weight: 53.6 kg 57.3 kg 56 kg  ? ? ?Examination: ? ?General exam: Pleasant elderly female laying in bed, restraints off, awake alert oriented to self, place, partly to time, upset and tearful ?HEENT: No JVD ?CVS: S1-S2, regular rate rhythm, systolic murmur ?Lungs: Improved air movement, clear ?Abdomen: Soft, nontender, bowel sounds present ?Extremities: No edema ?Skin: No rashes ?Psychiatry: Poor insight and judgment mood & affect appropriate.  ? ? ? ?Data Reviewed:  ? ?CBC: ?Recent Labs  ?Lab 07/27/21 ?0013 07/27/21 ?[id number] 07/28/21 ?0403 07/31/21 ?[id number]  ?WBC 14.0* 15.4* 11.3* 11.4*  ?NEUTROABS 11.4* 13.7*  --   --   ?HGB 12.5 11.4* 11.7* 14.3  ?HCT 38.5 35.4* 36.2 45.4  ?MCV 96.0 95.4 93.8 96.6  ?PLT 270 209 189 271  ? ?Basic Metabolic Panel: ?Recent Labs  ?Lab 07/27/21 ?[id number] 07/28/21 ?0403 07/29/21 ?[id number] 07/30/21 ?  [id number] 07/31/21 ?0306  ?NA 138 138 135 139 140  ?K 3.6 4.1 3.3* 4.1 3.8  ?CL 99 97* 93* 101 104  ?CO2 30 33* 31 29 24   ?GLUCOSE 150* 90 93 105* 113*  ?BUN 12 13 17 17 15   ?CREATININE 0.94 1.13* 1.13* 1.04* 0.93   ?CALCIUM 8.9 9.4 10.0 9.7 10.0  ?MG 1.5* 2.0  --   --   --   ? ?GFR: ?Estimated Creatinine Clearance: 43.4 mL/min (by C-G formula based on SCr of 0.93 mg/dL). ?Liver Function Tests: ?Recent Labs  ?Lab 07/27/21 ?[id number]  ?AST 22  ?ALT 20  ?ALKPHOS 62  ?BILITOT 1.0  ?PROT 6.2*  ?ALBUMIN 3.4*  ? ?No results for input(s): LIPASE, AMYLASE in the last 168 hours. ?No results for input(s): AMMONIA in the last 168 hours. ?Coagulation Profile: ?No results for input(s): INR, PROTIME in the last 168 hours. ?Cardiac Enzymes: ?No results for input(s): CKTOTAL, CKMB, CKMBINDEX, TROPONINI in the last 168 hours. ?BNP (last 3 results) ?No results for input(s): PROBNP in the last 8760 hours. ?HbA1C: ?No results for input(s): HGBA1C in the last 72 hours. ?CBG: ?No results for input(s): GLUCAP in the last 168 hours. ?Lipid Profile: ?No results for input(s): CHOL, HDL, LDLCALC, TRIG, CHOLHDL, LDLDIRECT in the last 72 hours. ?Thyroid Function Tests: ?No results for input(s): TSH, T4TOTAL, FREET4, T3FREE, THYROIDAB in the last 72 hours. ?Anemia Panel: ?No results for input(s): VITAMINB12, FOLATE, FERRITIN, TIBC, IRON, RETICCTPCT in the last 72 hours. ?Urine analysis: ?   ?Component Value Date/Time  ? COLORURINE AMBER (A) 07/30/2021 1331  ? APPEARANCEUR CLOUDY (A) 07/30/2021 1331  ? LABSPEC 1.012 07/30/2021 1331  ? PHURINE 6.0 07/30/2021 1331  ? GLUCOSEU NEGATIVE 07/30/2021 1331  ? HGBUR SMALL (A) 07/30/2021 1331  ? [place] NEGATIVE 07/30/2021 1331  ? [place] NEGATIVE 07/30/2021 1331  ? PROTEINUR 30 (A) 07/30/2021 1331  ? NITRITE NEGATIVE 07/30/2021 1331  ? LEUKOCYTESUR LARGE (A) 07/30/2021 1331  ? ?Sepsis Labs: ?@LABRCNTIP (procalcitonin:4,lacticidven:4) ? ?) ?Recent Results (from the past 240 hour(s))  ?Resp Panel by RT-PCR (Flu A&B, Covid) Nasopharyngeal Swab     Status: None  ? Collection Time: 07/27/21 12:26 AM  ? Specimen: Nasopharyngeal Swab; Nasopharyngeal(NP) swabs in vial transport medium  ?Result Value Ref Range Status  ? SARS  Coronavirus 2 by RT PCR NEGATIVE NEGATIVE Final  ?  Comment: (NOTE) ?SARS-CoV-2 target nucleic acids are NOT DETECTED. ? ?The SARS-CoV-2 RNA is generally detectable in upper respiratory ?specimens during the acute phase of infection. The lowest ?concentration of SARS-CoV-2 viral copies this assay can detect is ?138 copies/mL. A negative result does not preclude SARS-Cov-2 ?infection and should not be used as the sole basis for treatment or ?other patient management decisions. A negative result may occur with  ?improper specimen collection/handling, submission of specimen other ?than nasopharyngeal swab, presence of viral mutation(s) within the ?areas targeted by this assay, and inadequate number of viral ?copies(<138 copies/mL). A negative result must be combined with ?clinical observations, patient history, and epidemiological ?information. The expected result is Negative. ? ?Fact Sheet for Patients:  ?EntrepreneurPulse.com.au ? ?Fact Sheet for Healthcare Providers:  ?IncredibleEmployment.be ? ?This test is no t yet approved or cleared by the [place] FDA and  ?has been authorized for detection and/or diagnosis of SARS-CoV-2 by ?FDA under an Emergency Use Authorization (EUA). This EUA will remain  ?in effect (meaning this test can be used) for the duration of the ?COVID-19 declaration under Section 564(b)(1) of the Act, 21 ?U.S.C.section 360bbb-3(b)(1), unless  the authorization is terminated  ?or revoked sooner.  ? ? ?  ? Influenza A by PCR NEGATIVE NEGATIVE Final  ? Influenza B by PCR NEGATIVE NEGATIVE Final  ?  Comment: (NOTE) ?The Xpert Xpress SARS-CoV-2/FLU/RSV plus assay is intended as an aid ?in the diagnosis of influenza from Nasopharyngeal swab specimens and ?should not be used as a sole basis for treatment. Nasal washings and ?aspirates are unacceptable for Xpert Xpress SARS-CoV-2/FLU/RSV ?testing. ? ?Fact Sheet for  Patients: ?EntrepreneurPulse.com.au ? ?Fact Sheet for Healthcare Providers: ?IncredibleEmployment.be ? ?This test is not yet approved or cleared by the [place] FDA and ?has been authorized for detection and/or diagnosis

## 2021-07-31 NOTE — Plan of Care (Signed)
  Problem: Education: Goal: Ability to demonstrate management of disease process will improve Outcome: Progressing   Problem: Education: Goal: Ability to verbalize understanding of medication therapies will improve Outcome: Progressing   

## 2021-07-31 NOTE — Care Management Important Message (Signed)
Important Message ? ?Patient Details  ?Name: Maria Navarro ?MRN: [id number] ?Date of Birth: 03-29-1941 ? ? ?Medicare Important Message Given:  Yes ? ? ? ? ?Renie Ora ?07/31/2021, 8:54 AM ?

## 2021-07-31 NOTE — Progress Notes (Signed)
Safety sitter at bedside, patient is out of restraints and eating a snack. ?

## 2021-07-31 NOTE — Progress Notes (Signed)
Physical Therapy Treatment ?Patient Details ?Name: Maria Navarro ?MRN: [id number] ?DOB: 01/21/1941 ?Today's Date: 07/31/2021 ? ? ?History of Present Illness 81 y.o. female who presented 07/26/21 with weakness, SOB, and palpitation. S/p cardioversion 3/28. Imaging revealed pulmonary edema and pleural effusions. PMH: chronic Afib, aortic stenosis, anemia, HTN, OA, osteoporosis ? ?  ?PT Comments  ? ? The pt was eager to mobilize this afternoon and was able to complete hallway ambulation with minA of 1. She remains unsteady with single UE support, but demos good safety awareness by asking to use RW for improved stability. The pt then had difficulty managing the RW, and completed remainder of ambulation with single UE support though HHA. The pt remains hopeful for return home, will continue to benefit from skilled PT acutely to progress dynamic stability and independence with transfers. The pt states she will have good assist from family at d/c.  ?  ?Recommendations for follow up therapy are one component of a multi-disciplinary discharge planning process, led by the attending physician.  Recommendations may be updated based on patient status, additional functional criteria and insurance authorization. ? ?Follow Up Recommendations ? Home health PT ?  ?  ?Assistance Recommended at Discharge Frequent or constant Supervision/Assistance  ?Patient can return home with the following A little help with walking and/or transfers;A little help with bathing/dressing/bathroom;Assistance with cooking/housework;Assist for transportation;Help with stairs or ramp for entrance ?  ?Equipment Recommendations ? None recommended by PT  ?  ?Recommendations for Other Services   ? ? ?  ?Precautions / Restrictions Precautions ?Precautions: Fall;Other (comment) ?Precaution Comments: watch SpO2 and BP ?Restrictions ?Weight Bearing Restrictions: No  ?  ? ?Mobility ? Bed Mobility ?  ?  ?  ?  ?  ?  ?  ?General bed mobility comments: pt OOB at start and  end of session ?  ? ?Transfers ?Overall transfer level: Needs assistance ?Equipment used: 1 person hand held assist ?Transfers: Sit to/from Stand ?Sit to Stand: Min guard ?  ?  ?  ?  ?  ?General transfer comment: minG to rise, pt with mild instability with initial stand and reaching for UE support ?  ? ?Ambulation/Gait ?Ambulation/Gait assistance: Min assist ?Gait Distance (Feet): 100 Feet (+ 100 ft) ?Assistive device: Rolling walker (2 wheels), 1 person hand held assist ?Gait Pattern/deviations: Step-through pattern, Decreased stride length, Narrow base of support, Scissoring, Staggering left, Staggering right ?Gait velocity: reduced ?Gait velocity interpretation: <1.31 ft/sec, indicative of household ambulator ?  ?General Gait Details: pt with slow and unsteady gait without BUE support. reaching for rail with use of cane, asked for RW for stability. The pt then completed 100 ft with RW, but continued running into the wall, and therefore we tried 100 ft with HHA ? ? ? ?  ?Balance Overall balance assessment: Needs assistance ?Sitting-balance support: No upper extremity supported, Feet supported ?Sitting balance-Leahy Scale: Normal ?  ?  ?Standing balance support: No upper extremity supported, Reliant on assistive device for balance ?Standing balance-Leahy Scale: Fair ?  ?  ?  ?  ?  ?  ?  ?  ?  ?  ?  ?  ?  ? ?  ?Cognition Arousal/Alertness: Awake/alert ?Behavior During Therapy: [facility] for tasks assessed/performed ?Overall Cognitive Status: Within Functional Limits for tasks assessed ?  ?  ?  ?  ?  ?  ?  ?  ?  ?  ?  ?  ?  ?  ?  ?  ?General Comments: pt able to follow  all commands and demo good safety awareness (asking for RW instead of cane due to instability). pt reports her cognition is better than over weekend. ?  ?  ? ?  ?Exercises   ? ?  ?General Comments General comments (skin integrity, edema, etc.): VSS onRA ?  ?  ? ?Pertinent Vitals/Pain Pain Assessment ?Pain Assessment: Faces ?Faces Pain Scale: Hurts little  more ?Pain Location: headache ?Pain Descriptors / Indicators: Discomfort, Constant ?Pain Intervention(s): Monitored during session  ? ? ?Home Living Family/patient expects to be discharged to:: Private residence ?Living Arrangements: Alone ?Available Help at Discharge: Friend(s);Available 24 hours/day ?Type of Home: House ?Home Access: Stairs to enter ?Entrance Stairs-Rails: Can reach both ?Entrance Stairs-Number of Steps: 5-6 ?  ?Home Layout: One level ?Home Equipment: Conservation officer, nature (2 wheels);Cane - single point;Shower seat ?Additional Comments: Has a small dog named Sugar  ?  ?   ? ?PT Goals (current goals can now be found in the care plan section) Acute Rehab PT Goals ?Patient Stated Goal: to get better and go home ?PT Goal Formulation: With patient ?Time For Goal Achievement: 08/12/21 ?Potential to Achieve Goals: Good ?Progress towards PT goals: Progressing toward goals ? ?  ?Frequency ? ? ? Min 3X/week ? ? ? ?  ?PT Plan Current plan remains appropriate  ? ? ?   ?AM-PAC PT "6 Clicks" Mobility   ?Outcome Measure ? Help needed turning from your back to your side while in a flat bed without using bedrails?: None ?Help needed moving from lying on your back to sitting on the side of a flat bed without using bedrails?: None ?Help needed moving to and from a bed to a chair (including a wheelchair)?: A Little ?Help needed standing up from a chair using your arms (e.g., wheelchair or bedside chair)?: A Little ?Help needed to walk in hospital room?: A Little ?Help needed climbing 3-5 steps with a railing? : A Little ?6 Click Score: 20 ? ?  ?End of Session Equipment Utilized During Treatment: Gait belt ?Activity Tolerance: Patient tolerated treatment well ?Patient left: in chair;with call bell/phone within reach;with nursing/sitter in room;with family/visitor present ?Nurse Communication: Mobility status ?PT Visit Diagnosis: Unsteadiness on feet (R26.81);Other abnormalities of gait and mobility (R26.89);Difficulty in  walking, not elsewhere classified (R26.2);Dizziness and giddiness (R42) ?  ? ? ?Time: [id number] ?PT Time Calculation (min) (ACUTE ONLY): 16 min ? ?Charges:  $Therapeutic Exercise: 8-22 mins          ?          ? ?West Carbo, PT, DPT  ? ?Acute Rehabilitation Department ?Pager #: [phone] - [phone] ? ? ?Sandra Cockayne ?07/31/2021, 2:34 PM ? ?

## 2021-08-01 ENCOUNTER — Other Ambulatory Visit (HOSPITAL_COMMUNITY): Payer: Self-pay

## 2021-08-01 DIAGNOSIS — F05 Delirium due to known physiological condition: Secondary | ICD-10-CM

## 2021-08-01 LAB — BASIC METABOLIC PANEL
Anion gap: 13 (ref 5–15)
BUN: 15 mg/dL (ref 8–23)
CO2: 25 mmol/L (ref 22–32)
Calcium: 9.5 mg/dL (ref 8.9–10.3)
Chloride: 103 mmol/L (ref 98–111)
Creatinine, Ser: 1.06 mg/dL — ABNORMAL HIGH (ref 0.44–1.00)
GFR, Estimated: 53 mL/min — ABNORMAL LOW (ref >60)
Glucose, Bld: 92 mg/dL (ref 70–99)
Potassium: 3.9 mmol/L (ref 3.5–5.1)
Sodium: 141 mmol/L (ref 135–145)

## 2021-08-01 LAB — CBC
HCT: 42.9 % (ref 36.0–46.0)
Hemoglobin: 14.1 g/dL (ref 12.0–15.0)
MCH: 30.6 pg (ref 26.0–34.0)
MCHC: 32.9 g/dL (ref 30.0–36.0)
MCV: 93.1 fL (ref 80.0–100.0)
Platelets: 291 10*3/uL (ref 150–400)
RBC: 4.61 MIL/uL (ref 3.87–5.11)
RDW: 13.6 % (ref 11.5–15.5)
WBC: 9.6 10*3/uL (ref 4.0–10.5)
nRBC: 0 % (ref 0.0–0.2)

## 2021-08-01 MED ORDER — CEFDINIR 300 MG PO CAPS
300.0000 mg | ORAL_CAPSULE | Freq: Two times a day (BID) | ORAL | 0 refills | Status: AC
Start: 2021-08-01 — End: 2021-08-03
  Filled 2021-08-01: qty 4, 2d supply, fill #0

## 2021-08-01 MED ORDER — CEFDINIR 300 MG PO CAPS
300.0000 mg | ORAL_CAPSULE | Freq: Two times a day (BID) | ORAL | Status: DC
  Administered 2021-08-01: 300 mg via ORAL
  Filled 2021-08-01: qty 1

## 2021-08-01 MED ORDER — FUROSEMIDE 20 MG PO TABS
20.0000 mg | ORAL_TABLET | ORAL | 0 refills | Status: DC
  Filled 2021-08-01: qty 30, 60d supply, fill #0

## 2021-08-01 MED ORDER — POTASSIUM CHLORIDE CRYS ER 20 MEQ PO TBCR
20.0000 meq | EXTENDED_RELEASE_TABLET | ORAL | 0 refills | Status: DC
Start: 2021-08-01 — End: 2021-08-14
  Filled 2021-08-01: qty 15, 30d supply, fill #0

## 2021-08-01 MED ORDER — AMIODARONE HCL 200 MG PO TABS: 200.0000 mg | ORAL_TABLET | Freq: Every day | ORAL | Status: DC

## 2021-08-01 NOTE — Progress Notes (Signed)
Occupational Therapy Treatment ?Patient Details ?Name: Maria Navarro ?MRN: [id number] ?DOB: 03-10-1941 ?Today's Date: 08/01/2021 ? ? ?History of present illness 81 y.o. female who presented 07/26/21 with weakness, SOB, and palpitation. S/p cardioversion 3/28. Imaging revealed pulmonary edema and pleural effusions. PMH: chronic Afib, aortic stenosis, anemia, HTN, OA, osteoporosis ?  ?OT comments ? Pt progressing well towards established OT goals. Pt performing functional mobility in hallway with Supervision and RW. Pt performing grooming at sink with Supervision. Pt denying any fatigue this session. No LOB. Will continue to follow acutely. Recommend dc to home with HHOT.  ? ?Recommendations for follow up therapy are one component of a multi-disciplinary discharge planning process, led by the attending physician.  Recommendations may be updated based on patient status, additional functional criteria and insurance authorization. ?   ?Follow Up Recommendations ? Home health OT ; Pending progress, may not need HH services ?  ?Assistance Recommended at Discharge Frequent or constant Supervision/Assistance (Initial 24/7; first couple of days at home)  ?Patient can return home with the following ? A little help with walking and/or transfers;A little help with bathing/dressing/bathroom;Assistance with cooking/housework ?  ?Equipment Recommendations ? None recommended by OT  ?  ?Recommendations for Other Services PT consult ? ?  ?Precautions / Restrictions Precautions ?Precautions: Fall;Other (comment) ?Precaution Comments: watch SpO2 and BP ?Restrictions ?Weight Bearing Restrictions: No  ? ? ?  ? ?Mobility Bed Mobility ?Overal bed mobility: Needs Assistance ?Bed Mobility: Supine to Sit ?  ?  ?Supine to sit: Supervision ?  ?  ?General bed mobility comments: Supervision for safety ?  ? ?Transfers ?Overall transfer level: Needs assistance ?Equipment used: 1 person hand held assist ?Transfers: Sit to/from Stand ?Sit to Stand:  Supervision ?  ?  ?  ?  ?  ?General transfer comment: Supervision for safety ?  ?  ?Balance Overall balance assessment: Needs assistance ?Sitting-balance support: No upper extremity supported, Feet supported ?Sitting balance-Leahy Scale: Normal ?  ?  ?Standing balance support: No upper extremity supported, Reliant on assistive device for balance ?Standing balance-Leahy Scale: Fair ?  ?  ?  ?  ?  ?  ?  ?  ?  ?  ?  ?  ?   ? ?ADL either performed or assessed with clinical judgement  ? ?ADL Overall ADL's : Needs assistance/impaired ?  ?  ?Grooming: Dance movement psychotherapist;Wash/dry hands;Supervision/safety;Standing ?  ?  ?  ?  ?  ?  ?  ?  ?  ?  ?  ?  ?  ?  ?  ?Functional mobility during ADLs: Supervision/safety;Rolling walker (2 wheels) ?General ADL Comments: Pt performing functional mobility in hallway with groomign at sink with Supervision. ?  ? ?Extremity/Trunk Assessment Upper Extremity Assessment ?Upper Extremity Assessment: Overall WFL for tasks assessed ?  ?Lower Extremity Assessment ?Lower Extremity Assessment: Defer to PT evaluation ?  ?  ?  ? ?Vision   ?  ?  ?Perception   ?  ?Praxis   ?  ? ?Cognition Arousal/Alertness: Awake/alert ?Behavior During Therapy: [facility] for tasks assessed/performed ?Overall Cognitive Status: Within Functional Limits for tasks assessed ?  ?  ?  ?  ?  ?  ?  ?  ?  ?  ?  ?  ?  ?  ?  ?  ?General Comments: pt able to follow all commands and demo good safety awareness (asking for RW instead of cane due to instability). pt reports her cognition is better than over weekend. ?  ?  ?   ?  Exercises   ? ?  ?Shoulder Instructions   ? ? ?  ?General Comments VSS  ? ? ?Pertinent Vitals/ Pain       Pain Assessment ?Pain Assessment: Faces ?Faces Pain Scale: Hurts a little bit ?Pain Location: hips ?Pain Descriptors / Indicators: Discomfort, Constant ?Pain Intervention(s): Monitored during session, Limited activity within patient's tolerance, Repositioned ? ?Home Living   ?  ?  ?  ?  ?  ?  ?  ?  ?  ?  ?  ?  ?  ?  ?  ?   ?  ?  ? ?  ?Prior Functioning/Environment    ?  ?  ?  ?   ? ?Frequency ? Min 3X/week  ? ? ? ? ?  ?Progress Toward Goals ? ?OT Goals(current goals can now be found in the care plan section) ? Progress towards OT goals: Progressing toward goals ? ?Acute Rehab OT Goals ?OT Goal Formulation: With patient ?Time For Goal Achievement: 08/14/21 ?Potential to Achieve Goals: Good ?ADL Goals ?Pt Will Perform Grooming: with modified independence;standing ?Pt Will Perform Lower Body Dressing: with modified independence;sit to/from stand ?Pt Will Transfer to Toilet: with modified independence;ambulating;regular height toilet ?Pt Will Perform Toileting - Clothing Manipulation and hygiene: sitting/lateral leans;with modified independence;sit to/from stand  ?Plan Discharge plan remains appropriate   ? ?Co-evaluation ? ? ?   ?  ?  ?  ?  ? ?  ?AM-PAC OT "6 Clicks" Daily Activity     ?Outcome Measure ? ? Help from another person eating meals?: None ?Help from another person taking care of personal grooming?: A Little ?Help from another person toileting, which includes using toliet, bedpan, or urinal?: A Little ?Help from another person bathing (including washing, rinsing, drying)?: A Little ?Help from another person to put on and taking off regular upper body clothing?: A Little ?Help from another person to put on and taking off regular lower body clothing?: A Little ?6 Click Score: 19 ? ?  ?End of Session Equipment Utilized During Treatment: Rolling walker (2 wheels) ? ?OT Visit Diagnosis: Unsteadiness on feet (R26.81);Other abnormalities of gait and mobility (R26.89);Muscle weakness (generalized) (M62.81) ?  ?Activity Tolerance Patient tolerated treatment well ?  ?Patient Left with call bell/phone within reach ?  ?Nurse Communication Mobility status ?  ? ?   ? ?Time: [id number] ?OT Time Calculation (min): 19 min ? ?Charges: OT General Charges ?$OT Visit: 1 Visit ?OT Treatments ?$Self Care/Home Management : 8-22 mins ? ?Antonia Culbertson MSOT, OTR/L ?Acute Rehab ?Pager: [phone] ?Office: [phone] ? ?Raynard Mapps M Naylene Foell ?08/01/2021, 9:12 AM ? ? ?

## 2021-08-01 NOTE — Plan of Care (Signed)
DISCHARGE NOTE HOME ?Particia A Heaton to be discharged home per MD order. Discussed prescriptions and follow up appointments with the patient. Prescriptions given to patient; medication list explained in detail. Patient verbalized understanding. ? ?Skin clean, dry and intact without evidence of skin break down, no evidence of skin tears noted. IV catheter discontinued intact. Site without signs and symptoms of complications. Dressing and pressure applied. Pt denies pain at the site currently. No complaints noted. ? ?Patient free of lines, drains, and wounds.  ? ?An After Visit Summary (AVS) was printed and given to the patient. ?Patient escorted via wheelchair, and discharged home via private auto. ? ?Arlice Colt, RN  ?

## 2021-08-01 NOTE — Progress Notes (Signed)
Occupational Therapy Evaluation ?Late Entry on 08/01/21 ? ?PLOF, pt was living alone and was independent. Pt currently performing ADLs and functional mobility at [facility] A level. Pt demonstrating decreased balance and activity tolerance compared to baseline. Pt is very motivated to participate in therapy and return to PLOF. Pt and her niece reporting that she can receive increased support initially upon return to home. Pt would benefit from further acute OT to facilitate safe dc. Recommend dc to home with HHOT for further OT to optimize safety, independence with ADLs, and return to PLOF.  ? ? ? 07/31/21 1400  ?OT Visit Information  ?Last OT Received On 07/31/21  ?Assistance Needed +1  ?History of Present Illness 81 y.o. female who presented 07/26/21 with weakness, SOB, and palpitation. S/p cardioversion 3/28. Imaging revealed pulmonary edema and pleural effusions. PMH: chronic Afib, aortic stenosis, anemia, HTN, OA, osteoporosis  ?Precautions  ?Precautions Fall;Other (comment)  ?Precaution Comments watch SpO2 and BP  ?Restrictions  ?Weight Bearing Restrictions No  ?Home Living  ?Family/patient expects to be discharged to: Private residence  ?Living Arrangements Alone  ?Available Help at Discharge Friend(s);Available 24 hours/day  ?Type of Home House  ?Home Access Stairs to enter  ?Entrance Stairs-Number of Steps 5-6  ?Entrance Stairs-Rails Can reach both  ?Home Layout One level  ?Bathroom Shower/Tub Walk-in shower  ?Bathroom Toilet Standard  ?Home Equipment [employer] (2 wheels);Cane - single point;Shower seat  ?Additional Comments Has a small dog named Sugar  ?Prior Function  ?Prior Level of Function  Independent/Modified Independent;Driving  ?Mobility Comments Does not use AD.  ?ADLs Comments ADLs, IADLs, driving, and enjoys spending time with her friends  ?Communication  ?Communication No difficulties  ?Pain Assessment  ?Pain Assessment Faces  ?Faces Pain Scale 6  ?Pain Location headache  ?Pain Descriptors /  Indicators Discomfort;Constant  ?Pain Intervention(s) Monitored during session;Limited activity within patient's tolerance;Repositioned  ?Cognition  ?Arousal/Alertness Awake/alert  ?Behavior During Therapy [facility] for tasks assessed/performed  ?Overall Cognitive Status Within Functional Limits for tasks assessed  ?General Comments Pt able to follow commands, participate safely during ADLs, and recall events during session. Able to recognize that she was "out of it" and not safe before. Naming 2/3 animals that start with a C and then Min VC to think of last one.  ?Upper Extremity Assessment  ?Upper Extremity Assessment Overall WFL for tasks assessed  ?Lower Extremity Assessment  ?Lower Extremity Assessment Defer to PT evaluation  ?Cervical / Trunk Assessment  ?Cervical / Trunk Assessment Normal  ?ADL  ?Overall ADL's  Needs assistance/impaired  ?Eating/Feeding Set up;Sitting  ?Grooming Oral care;Wash/dry face;Wash/dry hands;Min guard;Standing  ?Upper Body Bathing Supervision/ safety;Set up;Sitting  ?Lower Body Bathing Min guard;Sit to/from stand  ?Upper Body Dressing  Supervision/safety;Set up;Sitting  ?Lower Body Dressing Min guard;Sit to/from stand  ?Psychologist, clinical guard;Ambulation;Regular Toilet;Grab bars;Rolling walker (2 wheels)  ?Toileting- Architect and Hygiene Min guard;Sitting/lateral lean  ?Functional mobility during ADLs Min guard;Rolling walker (2 wheels)  ?General ADL Comments Pt performing ADLs and functional mobility at [facility] A for safety  ?Bed Mobility  ?Overal bed mobility Needs Assistance  ?Bed Mobility Supine to Sit  ?Supine to sit Supervision  ?General bed mobility comments Supervision for safety  ?Transfers  ?Overall transfer level Needs assistance  ?Equipment used None  ?Transfers Sit to/from Stand  ?Sit to Stand Min guard  ?General transfer comment Min guard for safety, no LOB.  ?Balance  ?Overall balance assessment Needs assistance  ?Sitting-balance support No upper  extremity supported;Feet supported  ?  Sitting balance-Leahy Scale Normal  ?Standing balance support No upper extremity supported;Reliant on assistive device for balance  ?Standing balance-Leahy Scale Fair  ?General Comments  ?General comments (skin integrity, edema, etc.) VSS  ?OT - End of Session  ?Equipment Utilized During [employer] walker (2 wheels)  ?Activity Tolerance Patient tolerated treatment well  ?Patient left in chair;with call bell/phone within reach;with nursing/sitter in room;with family/visitor present  ?Nurse Communication Mobility status  ?OT Assessment  ?OT Recommendation/Assessment Patient needs continued OT Services  ?OT Visit Diagnosis Unsteadiness on feet (R26.81);Other abnormalities of gait and mobility (R26.89);Muscle weakness (generalized) (M62.81)  ?OT Problem List Decreased strength;Decreased range of motion;Decreased activity tolerance;Impaired balance (sitting and/or standing)  ?OT Plan  ?OT Frequency (ACUTE ONLY) Min 3X/week  ?OT Treatment/Interventions (ACUTE ONLY) Self-care/ADL training;Therapeutic exercise;DME and/or AE instruction;Energy conservation;Balance training  ?AM-PAC OT "6 Clicks" Daily Activity Outcome Measure (Version 2)  ?Help from another person eating meals? 4  ?Help from another person taking care of personal grooming? 3  ?Help from another person toileting, which includes using toliet, bedpan, or urinal? 3  ?Help from another person bathing (including washing, rinsing, drying)? 3  ?Help from another person to put on and taking off regular upper body clothing? 3  ?Help from another person to put on and taking off regular lower body clothing? 3  ?6 Click Score 19  ?Progressive Mobility  ?What is the highest level of mobility based on the progressive mobility assessment? Level 5 (Walks with assist in room/hall) - Balance while stepping forward/back and can walk in room with assist - Complete  ?Activity Ambulated with assistance in room  ?OT Recommendation   ?Recommendations for Other Services PT consult  ?Follow Up Recommendations Home health OT  ?Assistance recommended at discharge Frequent or constant Supervision/Assistance ?(Initial 24/7; first couple of days at home)  ?Patient can return home with the following A little help with walking and/or transfers;A little help with bathing/dressing/bathroom;Assistance with cooking/housework  ?Functional Status Assessent Patient has had a recent decline in their functional status and demonstrates the ability to make significant improvements in function in a reasonable and predictable amount of time.  ?OT Equipment None recommended by OT  ?Individuals Consulted  ?Consulted and Agree with Results and Recommendations Patient  ?Acute Rehab OT Goals  ?Patient Stated Goal Go home soon  ?OT Goal Formulation With patient  ?Time For Goal Achievement 08/14/21  ?Potential to Achieve Goals Good  ?OT Time Calculation  ?OT Start Time (ACUTE ONLY) 1338  ?OT Stop Time (ACUTE ONLY) 1353  ?OT Time Calculation (min) 15 min  ?OT General Charges  ?$OT Visit 1 Visit  ?OT Evaluation  ?$OT Eval Low Complexity 1 Low  ?Written Expression  ?Dominant Hand Right  ? ? ?Charlotte Brafford MSOT, OTR/L ?Acute Rehab ?Pager: [phone] ?Office: [phone] ?

## 2021-08-01 NOTE — TOC Transition Note (Signed)
Transition of Care (TOC) - CM/SW Discharge Note ? ? ?Patient Details  ?Name: Maria Navarro ?MRN: [id number] ?Date of Birth: 05/22/1940 ? ?Transition of Care (TOC) CM/SW Contact:  ?Leone Haven, RN ?Phone Number: ?08/01/2021, 9:43 AM ? ? ?Clinical Narrative:    ?Patient is for dc today,  NCM notified [place] with [employer].  MD to put orders in as well.  She has transport by friend at [employer].  ? ? ?Final next level of care: Home w Home Health Services ?Barriers to Discharge: No Barriers Identified ? ? ?Patient Goals and CMS Choice ?Patient states their goals for this hospitalization and ongoing recovery are:: return home with [facility] ?CMS Medicare.gov Compare Post Acute Care list provided to:: Patient ?Choice offered to / list presented to : Patient ? ?Discharge Placement ?  ?           ?  ?  ?  ?  ? ?Discharge Plan and Services ?In-house Referral: NA ?Discharge Planning Services: CM Consult ?Post Acute Care Choice: Home Health          ?  ?DME Agency: NA ?  ?  ?  ?HH Arranged: RN, PT, OT, Disease Management ?HH Agency: [facility] Care ?Date HH Agency Contacted: 07/31/21 ?Time HH Agency Contacted: 1700 ?Representative spoke with at [facility] Agency: Kandee Keen ? ?Social Determinants of Health (SDOH) Interventions ?Food Insecurity Interventions: Intervention Not Indicated ?Financial Strain Interventions: Intervention Not Indicated ?Housing Interventions: Intervention Not Indicated ?Transportation Interventions: Intervention Not Indicated ? ? ?Readmission Risk Interventions ?   ? View : No data to display.  ?  ?  ?  ? ? ? ? ? ?

## 2021-08-01 NOTE — Discharge Summary (Signed)
Physician Discharge Summary  ?Maria Navarro [id number] DOB: 19-Feb-1941 DOA: 07/26/2021 ? ?PCP: Algis Greenhouse, MD ? ?Admit date: 07/26/2021 ?Discharge date: 08/01/2021 ? ?Time spent: 2minutes ? ?Recommendations for Outpatient Follow-up:  ?PCP in 1 week ?Heart failure TOC clinic follow-up 4/10, please check BMP at follow-up ?Cardiology Dr. Geraldo Pitter in 2 weeks ? ?Discharge Diagnoses:  ?  Acute diastolic CHF (congestive heart failure) ([place]) ?  Malignant hypertension ?Delirium ?UTI ?  GERD (gastroesophageal reflux disease) ?  Atrial fibrillation ([place]) ?  Acquired thrombophilia ([place]) ?  Acute pulmonary edema ([facility]) ?  Acute respiratory failure with hypoxia ([place]) ?  Pulmonary nodules ?  Hypokalemia ?  Hypomagnesemia ?   ? ?Discharge Condition: Stable ? ?Diet recommendation: Stable ?Filed Weights  ? 07/30/21 0408 07/31/21 0503 08/01/21 0400  ?Weight: 57.3 kg 56 kg 55.4 kg  ? ? ?History of present illness:  ?80/F with history of chronic A-fib on amiodarone, Eliquis, recent cardioversion, history of aortic stenosis presented to the ED with acute worsening of dyspnea yesterday 3/29.  She noticed dyspnea on exertion for 3 to 4 days, with some leg swelling. ?-Presented to the ED in respiratory distress, blood pressure was 218/86, imaging noted pulmonary edema and pleural effusions. ?-Improved with diuresis ?-Hospital course complicated by delirium, confusion ? ?Hospital Course:  ? ?Acute hypoxic respiratory failure/Flash pulmonary edema ?Acute diastolic CHF  ?-Required BiPAP on admission, then off ?-Diuresed with IV Lasix, she is 4 L negative ?-2D echo with preserved EF, grade 3 DD, restrictive pattern ?-Appreciate cardiology input, diuretics held 4/1 with dizziness, borderline orthostasis ?-Discussed with cardiology will change Lasix to 20 Mg every other day at discharge ?-Weaned off O2, PT OT eval completed, home health physical therapy set up at follow-up ?-Follow-up with cardiology in 2 weeks ?  ?Delirium,  confusion ?UTI ?-Patient developed hallucinations 4/1 evening, no history of EtOH use ?-Suspect UTI and hospital delirium were culprit ?-UA abnormal, no symptoms of UTI, had good response to antibiotics also required brief doses of antipsychotics this admission  ?-Now mental status is completely cleared discharged home in a stable condition ?  ?Malignant hypertension ?-Improved, off nitro gtt. ?-Continue metoprolol, Cardizem discontinued ?  ?Mild aortic stenosis ?-Stable ?  ?Multiple lung nodules ?-Needs follow-up CT chest in few months ?  ?Hypokalemia ?Hypomagnesemia ?-Replaced ?  ?P.Atrial fibrillation ([place]) ?-recent DCCV 3/28 ?On amiodarone-high-dose and eliquis. ?-Continue metoprolol ?  ?GERD (gastroesophageal reflux disease) ?Hiatal hernia ?-Continue Protonix ?-chronic abdominal discomfort, improved on low-dose hyoscyamine> subsequently discontinued in the setting of delirium, advised to follow-up with GI, CT unremarkable ?  ?Code Status: DNR ? ?Discharge Exam: ?Vitals:  ? 08/01/21 0030 08/01/21 0400  ?BP: (!) 160/73 134/66  ?Pulse: 66 65  ?Resp: 18 18  ?Temp: 98 ?F (36.7 ?C) 97.8 ?F (36.6 ?C)  ?SpO2: 94% 93%  ? ?General exam: Pleasant elderly female laying in bed, restraints off, awake alert oriented to self, place, partly to time, upset and tearful ?HEENT: No JVD ?CVS: S1-S2, regular rate rhythm, systolic murmur ?Lungs: Improved air movement, clear ?Abdomen: Soft, nontender, bowel sounds present ?Extremities: No edema ?Skin: No rashes ?Psychiatry: Poor insight and judgment mood & affect appropriate.  ?  ? ?Discharge Instructions ? ? ?Discharge Instructions   ? ? Diet - low sodium heart healthy   Complete by: As directed ?  ? Increase activity slowly   Complete by: As directed ?  ? ?  ? ?Allergies as of 08/01/2021   ? ?   Reactions  ? Escitalopram  Other (See Comments)  ? sleepy  ? ?  ? ?  ?Medication List  ?  ? ?STOP taking these medications   ? ?diltiazem 240 MG 24 hr capsule ?Commonly known as: CARDIZEM CD ?   ? ?  ? ?TAKE these medications   ? ?acetaminophen 500 MG tablet ?Commonly known as: TYLENOL ?Take 1,000 mg by mouth daily as needed for moderate pain or headache (Arthritis). ?  ?amiodarone 200 MG tablet ?Commonly known as: PACERONE ?Take 1 tablet (200 mg total) by mouth daily. ?  ?buPROPion 300 MG 24 hr tablet ?Commonly known as: WELLBUTRIN XL ?Take 300 mg by mouth daily. ?  ?cefdinir 300 MG capsule ?Commonly known as: OMNICEF ?Take 1 capsule (300 mg total) by mouth every 12 (twelve) hours for 2 days. ?  ?DULoxetine 30 MG capsule ?Commonly known as: CYMBALTA ?Take 30 mg by mouth 2 (two) times daily. ?  ?Eliquis 5 MG Tabs tablet ?Generic drug: apixaban ?Take 1 tablet (5 mg total) by mouth 2 (two) times daily. ?  ?fexofenadine 180 MG tablet ?Commonly known as: ALLEGRA ?Take 180 mg by mouth daily. ?  ?furosemide 20 MG tablet ?Commonly known as: LASIX ?Take 1 tablet (20 mg total) by mouth every other day. ?Start taking on: August 02, 2021 ?  ?iron polysaccharides 150 MG capsule ?Commonly known as: NIFEREX ?Take 150 mg by mouth every other day. ?  ?LAXATIVE PLUS STOOL SOFTENER PO ?Take 2 tablets by mouth at bedtime. ?  ?metoprolol tartrate 50 MG tablet ?Commonly known as: LOPRESSOR ?Take 1 tablet (50 mg total) by mouth 2 (two) times daily. ?  ?OMEGA-3 FISH OIL PO ?Take 1,000 mg by mouth every morning. ?  ?OSTEO BI-FLEX ONE PER DAY PO ?Take 1 tablet by mouth 2 (two) times daily. ?  ?pantoprazole 40 MG tablet ?Commonly known as: PROTONIX ?Take 40 mg by mouth 2 (two) times daily. ?  ?[place] ?Take 1 capsule by mouth 3 (three) times a week. No set days ?  ?potassium chloride SA 20 MEQ tablet ?Commonly known as: KLOR-CON M ?Take 1 tablet (20 mEq total) by mouth every other day. ?  ?QC TUMERIC COMPLEX PO ?Take 1 tablet by mouth 3 (three) times a week. No set days ?  ?traMADol 50 MG tablet ?Commonly known as: ULTRAM ?Take 50 mg by mouth every 6 (six) hours as needed for moderate pain or severe pain. ?   ?VISION FORMULA PO ?Take 1 tablet by mouth every morning. 50 + ?  ?Vitamin B12 1000 MCG Tbcr ?Take 1,000 mcg by mouth every morning. ?  ?Vitamin D3 50 MCG (2000 UT) Tabs ?Take 2,000 Units by mouth every morning. ?  ? ?  ? ?Allergies  ?Allergen Reactions  ? Escitalopram Other (See Comments)  ?  sleepy  ? ? Follow-up Information   ? ? [facility]. Go to.   ?Specialty: Cardiology ?Why: 4/10 at 11:00AM to the [facility] Transition of Care Clinic.  ?[facility]   ?Please bring all medications to appt. ?Contact information: ?[address] ?[id number] mc ?[place] ?[phone] ? ?  ?  ? ? Algis Greenhouse, MD. Go on 08/08/2021.   ?Specialty: Family Medicine ?Why: @2 :30pm ?Contact information: ?[address] ?[place] [address] ?[phone] ? ? ?  ?  ? ? Vickie Epley, MD .   ?Specialties: Cardiology, Radiology ?Contact information: ?[address] ?[place] [address] ?[phone] ? ? ?  ?  ? ?  Revankar, Reita Cliche, MD .   ?Specialty: Cardiology ?Contact information: ?[address] ?[place] [address] ?[phone] ? ? ?  ?  ? ? Care, [facility] Follow up.   ?Specialty: Home Health Services ?Why: HHRN, HHOT, [place] will call you with apt times ?Contact information: ?[address] ?[address] ?[place] [address] ?[phone] ? ? ?  ?  ? ?  ?  ? ?  ? ? ? ?The results of significant diagnostics from this hospitalization (including imaging, microbiology, ancillary and laboratory) are listed below for reference.   ? ?Significant Diagnostic Studies: ?CT HEAD WO CONTRAST (5MM) ? ?Result Date: 07/29/2021 ?CLINICAL DATA:  Altered mental status EXAM: CT HEAD WITHOUT CONTRAST TECHNIQUE: Contiguous axial images were obtained from the base of the skull through the vertex without intravenous contrast. RADIATION DOSE REDUCTION: This exam was performed according to the departmental dose-optimization program  which includes automated exposure control, adjustment of the mA and/or kV according to patient size and/or use of iterative reconstruction technique. COMPARISON:  None. FINDINGS: Brain: Normal anatomic configuration. [place]

## 2021-08-02 ENCOUNTER — Telehealth: Payer: Self-pay

## 2021-08-02 NOTE — Telephone Encounter (Signed)
Called to check on patient after recent hospitalization.  ?She would really like to have LAAO on 5/25. As discussed previously with Dr. Lalla Brothers, she will need to be scheduled on a 4D ICE day given large hernia (see CT results for more information). ? ?Given new issues, scheduled the patient with Dr. Lalla Brothers on 5/11 for assessment and to decide together whether or not to proceed on 5/25. ?The patient understands if proceeding, she will get blood work and Leisure centre manager. ?She was grateful for follow-up and agrees with plan.  ?

## 2021-08-06 NOTE — Progress Notes (Addendum)
? ? ?HEART & VASCULAR TRANSITION OF CARE CONSULT NOTE  ? ? ? ?Referring Physician: ?Primary Care: ?Primary Cardiologist: ? ?HPI: ?Referred to clinic by Dr. Broadus John for heart failure consultation. 81 y.o. female with history of persistent AF (diagnosed June 22, previously failed cardioversion), aortic valve stenosis, HTN, GERD, DM, HLD, anemia.  ? ?Saw Dr. Quentin Ore 02/23 for consideration of [place] d/t episodes of epistaxis and hematuria while on Eliquis. She was started on amiodarone with plans for DCCV in 6 weeks. Underwent successful DCCV on 07/25/21. ? ?Admitted 07/26/21 with acute hypoxic respiratory failure secondary to acute diastloic CHF/flash pulmonary edema. Initially required BiPAP. Placed on nitroglycerin gtt for malignant hypertension. This was eventually weaned off and she was maintained on metoprolol and diltiazem (diltiazem later held d/t bradycardia) with adequate control of blood pressure. Diuresed with IV lasix. Maintaining sinus rhythm. Cardiology consulted. Echo EF 60-65%, RV okay, mild aortic valve stenosis with mean gradient of 12 mmHg. Renal artery duplex with no evidence of significant renal artery stenosis. Course c/b delirium and confusion. Treated with abx for UTI. ? ?Reports significant improvement in dyspnea post hospitalization. Still has some SOB with exertion but is able to tolerate more activity. No orthopnea, PND or leg edema. Home weight has been stable between 121-123 lb. She lives alone and is independent in ADLs. Taking all medications as prescribed. Has been monitoring blood pressure at home, some systolic pressures [id number]. Most recent readings last couple of days 150s-170s. She isn't sure if home machine is accurate. ? ? ?Review of Systems: [y] = yes, [ ]  = no  ? ?General: Weight gain [ ] ; Weight loss [ ] ; Anorexia [ ] ; Fatigue [ ] ; Fever [ ] ; Chills [ ] ; Weakness [ ]   ?Cardiac: Chest pain/pressure [ ] ; Resting SOB [ ] ; Exertional SOB [Y]; Orthopnea [ ] ; Pedal Edema [ ] ;  Palpitations [ ] ; Syncope [ ] ; Presyncope [ ] ; Paroxysmal nocturnal dyspnea[ ]   ?Pulmonary: Cough [ ] ; Wheezing[ ] ; Hemoptysis[ ] ; Sputum [ ] ; Snoring [ ]   ?GI: Vomiting[ ] ; Dysphagia[ ] ; Melena[ ] ; Hematochezia [ ] ; Heartburn[ ] ; Abdominal pain [ ] ; Constipation [ ] ; Diarrhea [ ] ; BRBPR [ ]   ?GU: Hematuria[ ] ; Dysuria [ ] ; Nocturia[ ]   ?Vascular: Pain in legs with walking [ ] ; Pain in feet with lying flat [ ] ; Non-healing sores [ ] ; Stroke [ ] ; TIA [ ] ; Slurred speech [ ] ;  ?Neuro: Headaches[ ] ; Vertigo[ ] ; Seizures[ ] ; Paresthesias[ ] ;Blurred vision [ ] ; Diplopia [ ] ; Vision changes [ ]   ?Ortho/Skin: Arthritis [ ] ; Joint pain [ ] ; Muscle pain [ ] ; Joint swelling [ ] ; Back Pain [ ] ; Rash [ ]   ?Psych: Depression[ ] ; Anxiety[ ]   ?Heme: Bleeding problems [Y]; Clotting disorders [ ] ; Anemia [ ]   ?Endocrine: Diabetes [ ] ; Thyroid dysfunction[ ]  ? ? ?Past Medical History:  ?Diagnosis Date  ? Allergic rhinitis 08/10/2015  ? Anemia 11/15/2020  ? Formatting of this note might be different from the original. 10/2020: incidental HGB 11.1, MCV 96, RDW 14  ? Aortic valve sclerosis 05/14/2019  ? Formatting of this note might be different from the original. 06/02/2020: ECHO, mild  ? Atrial fibrillation ([place]) 10/26/2020  ? Formatting of this note might be different from the original. 10/26/2020: noted pre-op by anesthesia, rate 118, to ED  ? Benign hypertension 08/18/2015  ? Chronic bilateral low back pain without sciatica 05/30/2020  ? Family history of premature coronary artery disease 04/14/2018  ? GERD (gastroesophageal reflux disease) 08/10/2015  ?  Lumbar disc disease 05/30/2020  ? Mixed hyperlipidemia 10/03/2015  ? 2018: declined rx 2019: declinded rx  ? Osteoarthritis 08/10/2015  ? Osteoporosis 10/03/2015  ? 2008: -1.5 2017: -2.5 hip  Formatting of this note might be different from the original. 2008: -1.5 2017: -2.5 hip 2021: -2.6  ? Pain syndrome, chronic 08/10/2015  ? (2004) DX/RX: NSAID (2006) DX: NSAID Gastric ulcer with esoph stricture   (2006) RX: tramadol  ? Plantar fasciitis 08/18/2016  ? Recurrent major depressive disorder, in remission ([place]) 08/10/2015  ? Screening for diabetes mellitus (DM) 10/03/2015  ? ? ?Current Outpatient Medications  ?Medication Sig Dispense Refill  ? acetaminophen (TYLENOL) 500 MG tablet Take 1,000 mg by mouth daily as needed for moderate pain or headache (Arthritis).    ? amiodarone (PACERONE) 200 MG tablet Take 1 tablet (200 mg total) by mouth daily.    ? [place]-Glucosamine-Vit D (OSTEO BI-FLEX ONE PER DAY PO) Take 1 tablet by mouth 2 (two) times daily.    ? buPROPion (WELLBUTRIN XL) 300 MG 24 hr tablet Take 300 mg by mouth daily.    ? Casanthranol-Docusate Sodium (LAXATIVE PLUS STOOL SOFTENER PO) Take 2 tablets by mouth at bedtime.    ? Cholecalciferol (VITAMIN D3) 50 MCG (2000 UT) TABS Take 2,000 Units by mouth every morning.    ? Cyanocobalamin (VITAMIN B12) 1000 MCG TBCR Take 1,000 mcg by mouth every morning.    ? DULoxetine (CYMBALTA) 30 MG capsule Take 30 mg by mouth 2 (two) times daily.    ? ELIQUIS 5 MG TABS tablet TAKE 1 TABLET TWICE A DAY 180 tablet 3  ? fexofenadine (ALLEGRA) 180 MG tablet Take 180 mg by mouth daily.    ? furosemide (LASIX) 20 MG tablet Take 1 tablet (20 mg total) by mouth every other day. 30 tablet 0  ? iron polysaccharides (NIFEREX) 150 MG capsule Take 150 mg by mouth every other day.    ? Multiple Vitamins-Minerals (VISION FORMULA PO) Take 1 tablet by mouth every morning. 50 +    ? Omega-3 Fatty Acids (OMEGA-3 FISH OIL PO) Take 1,000 mg by mouth every morning.    ? pantoprazole (PROTONIX) 40 MG tablet Take 40 mg by mouth 2 (two) times daily.    ? potassium chloride SA (KLOR-CON M) 20 MEQ tablet Take 1 tablet (20 mEq total) by mouth every other day. 30 tablet 0  ? Probiotic Product ([place]) CAPS Take 1 capsule by mouth 3 (three) times a week. No set days    ? sacubitril-valsartan (ENTRESTO) 24-26 MG Take 1 tablet by mouth 2 (two) times daily. 60 tablet 0  ? spironolactone  (ALDACTONE) 25 MG tablet Take 0.5 tablets (12.5 mg total) by mouth daily. 30 tablet 11  ? traMADol (ULTRAM) 50 MG tablet Take 50 mg by mouth every 6 (six) hours as needed for moderate pain or severe pain.    ? Turmeric (QC TUMERIC COMPLEX PO) Take 1 tablet by mouth 3 (three) times a week. No set days    ? metoprolol tartrate (LOPRESSOR) 25 MG tablet Take 2 tablets (50 mg total) by mouth 2 (two) times daily. 30 tablet 11  ? ?No current facility-administered medications for this encounter.  ? ? ?Allergies  ?Allergen Reactions  ? Escitalopram Other (See Comments)  ?  sleepy  ? ? ?  ?Social History  ? ?Socioeconomic History  ? Marital status: Widowed  ?  Spouse name: Not on file  ? Number of children: 3  ? Years of  education: Not on file  ? Highest education level: 11th grade  ?Occupational History  ? Occupation: Retired  ?Tobacco Use  ? Smoking status: Never  ? Smokeless tobacco: Never  ?Vaping Use  ? Vaping Use: Never used  ?Substance and Sexual Activity  ? Alcohol use: Not Currently  ? Drug use: Never  ? Sexual activity: Not on file  ?Other Topics Concern  ? Not on file  ?Social History Narrative  ? Not on file  ? ?Social Determinants of Health  ? ?Financial Resource Strain: Low Risk   ? Difficulty of Paying Living Expenses: Not hard at all  ?Food Insecurity: No Food Insecurity  ? Worried About Charity fundraiser in the Last Year: Never true  ? Ran Out of Food in the Last Year: Never true  ?Transportation Needs: No Transportation Needs  ? Lack of Transportation (Medical): No  ? Lack of Transportation (Non-Medical): No  ?Physical Activity: Not on file  ?Stress: Not on file  ?Social Connections: Not on file  ?Intimate Partner Violence: Not on file  ? ? ?  ?Family History  ?Family history unknown: Yes  ? ? ?Vitals:  ? 08/07/21 1135  ?BP: 138/68  ?Pulse: 62  ?SpO2: 98%  ?Weight: 58 kg  ? ? ?PHYSICAL EXAM: ?General:  Well appearing. Ambulated into clinic. ?HEENT: normal ?Neck: supple. no JVD.  ?Cor: PMI nondisplaced.  Regular rate & rhythm. No rubs, gallops or murmurs. ?Lungs: clear ?Abdomen: soft, nontender, nondistended. No hepatosplenomegaly.  ?Extremities: no cyanosis, clubbing, rash, edema ?Neuro: alert & oriented x 3,

## 2021-08-07 ENCOUNTER — Ambulatory Visit (HOSPITAL_COMMUNITY)
Admit: 2021-08-07 | Discharge: 2021-08-07 | Disposition: A | Discharge status: Home / Self Care | Payer: Medicare Other | Attending: Physician Assistant | Admitting: Physician Assistant

## 2021-08-07 ENCOUNTER — Other Ambulatory Visit: Payer: Self-pay | Admitting: Cardiology

## 2021-08-07 ENCOUNTER — Encounter (HOSPITAL_COMMUNITY): Payer: Self-pay

## 2021-08-07 ENCOUNTER — Telehealth (HOSPITAL_COMMUNITY): Payer: Self-pay | Admitting: *Deleted

## 2021-08-07 VITALS — BP 138/68 | HR 62 | Wt 127.8 lb

## 2021-08-07 DIAGNOSIS — I5032 Chronic diastolic (congestive) heart failure: Secondary | ICD-10-CM

## 2021-08-07 DIAGNOSIS — I110 Hypertensive heart disease with heart failure: Secondary | ICD-10-CM | POA: Diagnosis not present

## 2021-08-07 DIAGNOSIS — I10 Essential (primary) hypertension: Secondary | ICD-10-CM | POA: Diagnosis not present

## 2021-08-07 DIAGNOSIS — Z7901 Long term (current) use of anticoagulants: Secondary | ICD-10-CM | POA: Diagnosis not present

## 2021-08-07 DIAGNOSIS — I5030 Unspecified diastolic (congestive) heart failure: Secondary | ICD-10-CM | POA: Insufficient documentation

## 2021-08-07 DIAGNOSIS — Z79899 Other long term (current) drug therapy: Secondary | ICD-10-CM | POA: Diagnosis not present

## 2021-08-07 DIAGNOSIS — E785 Hyperlipidemia, unspecified: Secondary | ICD-10-CM | POA: Diagnosis not present

## 2021-08-07 DIAGNOSIS — R918 Other nonspecific abnormal finding of lung field: Secondary | ICD-10-CM | POA: Diagnosis not present

## 2021-08-07 DIAGNOSIS — I480 Paroxysmal atrial fibrillation: Secondary | ICD-10-CM | POA: Insufficient documentation

## 2021-08-07 DIAGNOSIS — E119 Type 2 diabetes mellitus without complications: Secondary | ICD-10-CM | POA: Insufficient documentation

## 2021-08-07 DIAGNOSIS — K219 Gastro-esophageal reflux disease without esophagitis: Secondary | ICD-10-CM | POA: Insufficient documentation

## 2021-08-07 DIAGNOSIS — I350 Nonrheumatic aortic (valve) stenosis: Secondary | ICD-10-CM | POA: Diagnosis not present

## 2021-08-07 LAB — COMPREHENSIVE METABOLIC PANEL
ALT: 18 U/L (ref 0–44)
AST: 25 U/L (ref 15–41)
Albumin: 4.1 g/dL (ref 3.5–5.0)
Alkaline Phosphatase: 73 U/L (ref 38–126)
Anion gap: 8 (ref 5–15)
BUN: 14 mg/dL (ref 8–23)
CO2: 32 mmol/L (ref 22–32)
Calcium: 10.2 mg/dL (ref 8.9–10.3)
Chloride: 99 mmol/L (ref 98–111)
Creatinine, Ser: 1.14 mg/dL — ABNORMAL HIGH (ref 0.44–1.00)
GFR, Estimated: 49 mL/min — ABNORMAL LOW (ref >60)
Glucose, Bld: 100 mg/dL — ABNORMAL HIGH (ref 70–99)
Potassium: 4.9 mmol/L (ref 3.5–5.1)
Sodium: 139 mmol/L (ref 135–145)
Total Bilirubin: 0.7 mg/dL (ref 0.3–1.2)
Total Protein: 7.7 g/dL (ref 6.5–8.1)

## 2021-08-07 LAB — BRAIN NATRIURETIC PEPTIDE: B Natriuretic Peptide: 458.1 pg/mL — ABNORMAL HIGH (ref 0.0–100.0)

## 2021-08-07 LAB — T4, FREE: Free T4: 1.22 ng/dL — ABNORMAL HIGH (ref 0.61–1.12)

## 2021-08-07 LAB — TSH: TSH: 1.703 u[IU]/mL (ref 0.350–4.500)

## 2021-08-07 MED ORDER — SPIRONOLACTONE 25 MG PO TABS: 12.5000 mg | ORAL_TABLET | Freq: Every day | ORAL | 11 refills | Status: DC

## 2021-08-07 MED ORDER — ENTRESTO 24-26 MG PO TABS: 1.0000 | ORAL_TABLET | Freq: Two times a day (BID) | ORAL | 0 refills | Status: DC

## 2021-08-07 MED ORDER — METOPROLOL TARTRATE 25 MG PO TABS: 50.0000 mg | ORAL_TABLET | Freq: Two times a day (BID) | ORAL | 11 refills | Status: DC

## 2021-08-07 NOTE — Telephone Encounter (Signed)
Called to confirm Heart & Vascular Transitions of Care appointment at 11am on 08/07/21. Patient reminded to bring all medications and pill box organizer with them. Confirmed patient has transportation. Gave directions, instructed to utilize valet parking. ? ?Confirmed appointment prior to ending call.   ? ?Rhae Hammock, BSN, RN ?Heart Failure Nurse Navigator ?Secure Chat Only ?

## 2021-08-07 NOTE — Patient Instructions (Addendum)
Start Spironolactone 12.5 mg (1/2 Tab ) daily. ? ?Start Entresto 24/26 Twice daily ? ?Decrease Metoprolol to 25 mg ( 1/2 Tab ) Twice daily ? ? ?Labs done today, your results will be available in MyChart, we will contact you for abnormal readings. ? ?Repeat blood work in 7-10 days. This can be done at Dr. Agustin Cree office  ? ?Thank you for allowing [place] to provider your heart failure care after your recent hospitalization. Please follow-up with Dr. Agustin Cree as scheduled  ? ?Buy a new blood pressure cuff. ? ?If you have any questions, issues, or concerns before your next appointment please call our office at [phone], opt. 2 and leave a message for the triage nurse. ? ? ?

## 2021-08-07 NOTE — Telephone Encounter (Signed)
Prescription refill request for Eliquis received. ?Indication:Afib ?Last office visit:2/23 ?Scr:1.0 ?Age: 81 ?Weight:55.4 kg ? ?Prescription refilled ? ?

## 2021-08-07 NOTE — Progress Notes (Signed)
Pt returned call she is aware and agreeable with plan.  

## 2021-08-08 LAB — T3, FREE: T3, Free: 2.3 pg/mL (ref 2.0–4.4)

## 2021-08-09 NOTE — Addendum Note (Signed)
Encounter addended by: Linda Hedges, RN on: 08/09/2021 3:43 PM ? Actions taken: Medication long-term status modified, Order list changed

## 2021-08-10 DIAGNOSIS — N390 Urinary tract infection, site not specified: Secondary | ICD-10-CM | POA: Insufficient documentation

## 2021-08-14 ENCOUNTER — Telehealth: Payer: Self-pay

## 2021-08-14 NOTE — Telephone Encounter (Signed)
Pt advised that medication has been updated. ?

## 2021-08-14 NOTE — Telephone Encounter (Signed)
Patient was returning call. Please advise ?

## 2021-08-14 NOTE — Telephone Encounter (Signed)
Aldactone and Potassium was already off her med list prior to her request to mark as d/c. Diltiazem removed. All meds needing to be added was on her her list.  ?

## 2021-08-15 LAB — BASIC METABOLIC PANEL
BUN/Creatinine Ratio: 16 (ref 12–28)
BUN: 18 mg/dL (ref 8–27)
CO2: 29 mmol/L (ref 20–29)
Calcium: 10.1 mg/dL (ref 8.7–10.3)
Chloride: 99 mmol/L (ref 96–106)
Creatinine, Ser: 1.15 mg/dL — ABNORMAL HIGH (ref 0.57–1.00)
Glucose: 104 mg/dL — ABNORMAL HIGH (ref 70–99)
Potassium: 4.1 mmol/L (ref 3.5–5.2)
Sodium: 143 mmol/L (ref 134–144)
eGFR: 48 mL/min/{1.73_m2} — ABNORMAL LOW (ref >59)

## 2021-08-15 LAB — CBC WITH DIFFERENTIAL/PLATELET
Basophils Absolute: 0.1 10*3/uL (ref 0.0–0.2)
Basos: 1 %
EOS (ABSOLUTE): 0.3 10*3/uL (ref 0.0–0.4)
Eos: 3 %
Hematocrit: 43.2 % (ref 34.0–46.6)
Hemoglobin: 14.2 g/dL (ref 11.1–15.9)
Immature Grans (Abs): 0 10*3/uL (ref 0.0–0.1)
Immature Granulocytes: 0 %
Lymphocytes Absolute: 2.1 10*3/uL (ref 0.7–3.1)
Lymphs: 25 %
MCH: 30.1 pg (ref 26.6–33.0)
MCHC: 32.9 g/dL (ref 31.5–35.7)
MCV: 92 fL (ref 79–97)
Monocytes Absolute: 0.6 10*3/uL (ref 0.1–0.9)
Monocytes: 7 %
Neutrophils Absolute: 5.6 10*3/uL (ref 1.4–7.0)
Neutrophils: 64 %
Platelets: 295 10*3/uL (ref 150–450)
RBC: 4.72 x10E6/uL (ref 3.77–5.28)
RDW: 12.7 % (ref 11.7–15.4)
WBC: 8.7 10*3/uL (ref 3.4–10.8)

## 2021-08-16 ENCOUNTER — Other Ambulatory Visit: Payer: Self-pay

## 2021-08-16 MED ORDER — AMIODARONE HCL 200 MG PO TABS: 200.0000 mg | ORAL_TABLET | Freq: Every day | ORAL | 3 refills | Status: DC

## 2021-08-24 ENCOUNTER — Encounter: Payer: Self-pay | Admitting: *Deleted

## 2021-09-06 ENCOUNTER — Other Ambulatory Visit: Payer: Self-pay | Admitting: Cardiology

## 2021-09-07 ENCOUNTER — Other Ambulatory Visit (HOSPITAL_COMMUNITY): Payer: Self-pay

## 2021-09-07 ENCOUNTER — Other Ambulatory Visit (HOSPITAL_COMMUNITY): Payer: Self-pay | Admitting: Physician Assistant

## 2021-09-07 ENCOUNTER — Encounter: Payer: Self-pay | Admitting: Cardiology

## 2021-09-07 ENCOUNTER — Ambulatory Visit (INDEPENDENT_AMBULATORY_CARE_PROVIDER_SITE_OTHER): Payer: Medicare Other | Admitting: Cardiology

## 2021-09-07 VITALS — BP 124/80 | HR 61 | Ht 65.0 in | Wt 128.0 lb

## 2021-09-07 DIAGNOSIS — Z79899 Other long term (current) drug therapy: Secondary | ICD-10-CM

## 2021-09-07 DIAGNOSIS — I10 Essential (primary) hypertension: Secondary | ICD-10-CM

## 2021-09-07 DIAGNOSIS — R443 Hallucinations, unspecified: Secondary | ICD-10-CM

## 2021-09-07 DIAGNOSIS — I5032 Chronic diastolic (congestive) heart failure: Secondary | ICD-10-CM | POA: Diagnosis not present

## 2021-09-07 DIAGNOSIS — I480 Paroxysmal atrial fibrillation: Secondary | ICD-10-CM

## 2021-09-07 MED ORDER — ENTRESTO 24-26 MG PO TABS: 1.0000 | ORAL_TABLET | Freq: Two times a day (BID) | ORAL | 0 refills | Status: DC

## 2021-09-07 MED ORDER — FUROSEMIDE 20 MG PO TABS: 20.0000 mg | ORAL_TABLET | ORAL | 3 refills | Status: DC

## 2021-09-07 NOTE — Progress Notes (Signed)
?Electrophysiology Office Follow up Visit Note:   ? ?Date:  09/07/2021  ? ?ID:  Maria Navarro, DOB [birth year], MRN [id number] ? ?PCP:  Algis Greenhouse, MD  ?[facility] Cardiologist:  Jenean Lindau, MD  ?[place] Electrophysiologist:  Vickie Epley, MD  ? ? ?Interval History:   ? ?Maria Navarro is a 81 y.o. female who presents for a follow up visit. They were last seen in clinic 06/13/2021. She is scheduled for a left atrial appendage occlusion on 09/21/2021. ? ?Since their last appointment, they underwent successful cardioversion on 07/25/2021. On 07/26/21 she presented to the ED with acute worsening shortness of breath. She had noticed DOE and leg swelling for 3-4 days prior. She was in respiratory distress with BP 218/86; imaging revealed pulmonary edema and pleural effusions. She was admitted for acute hypoxic respiratory failure secondary to acute diastolic CHF/flash pulmonary edema. She improved with diuresis. It was noted her hospital course was complicated by symptoms of delirium (hallucinations) and confusion. She was weaned off oxygen and discharged on 20 mg Lasix every other day. ? ?Today, she states that she "feels drunk all the time."  Also she confirms having hallucinations of family members or other hallucinations at times. She is feeling better overall since her hospitalization, but she is still feeling weak. ? ?She confirms taking 100 mg amiodarone, and 1 tablet of metoprolol twice a day. It is difficult for her to determine if she noticed any differences from her medication changes. ? ?Last night she suffered a minor injury to her foot with prolonged bleeding. She remains compliant with Eliquis. ? ?For activity she enjoys arts and crafts. Sometimes she notes working on a project, and when she wakes up there is nothing in her hands. ? ?She denies any palpitations, chest pain, shortness of breath, or peripheral edema. No lightheadedness, headaches, syncope, orthopnea, or PND. ? ?Two years  ago she had a COVID infection. ? ?  ? ?Past Medical History:  ?Diagnosis Date  ? Allergic rhinitis 08/10/2015  ? Anemia 11/15/2020  ? Formatting of this note might be different from the original. 10/2020: incidental HGB 11.1, MCV 96, RDW 14  ? Aortic valve sclerosis 05/14/2019  ? Formatting of this note might be different from the original. 06/02/2020: ECHO, mild  ? Atrial fibrillation ([place]) 10/26/2020  ? Formatting of this note might be different from the original. 10/26/2020: noted pre-op by anesthesia, rate 118, to ED  ? Benign hypertension 08/18/2015  ? Chronic bilateral low back pain without sciatica 05/30/2020  ? Family history of premature coronary artery disease 04/14/2018  ? GERD (gastroesophageal reflux disease) 08/10/2015  ? Lumbar disc disease 05/30/2020  ? Mixed hyperlipidemia 10/03/2015  ? 2018: declined rx 2019: declinded rx  ? Osteoarthritis 08/10/2015  ? Osteoporosis 10/03/2015  ? 2008: -1.5 2017: -2.5 hip  Formatting of this note might be different from the original. 2008: -1.5 2017: -2.5 hip 2021: -2.6  ? Pain syndrome, chronic 08/10/2015  ? (2004) DX/RX: NSAID (2006) DX: NSAID Gastric ulcer with esoph stricture  (2006) RX: tramadol  ? Plantar fasciitis 08/18/2016  ? Recurrent major depressive disorder, in remission ([place]) 08/10/2015  ? Screening for diabetes mellitus (DM) 10/03/2015  ? ? ?Past Surgical History:  ?Procedure Laterality Date  ? APPENDECTOMY    ? CARDIOVERSION  12/29/2020  ? CARDIOVERSION N/A 07/25/2021  ? Procedure: CARDIOVERSION;  Surgeon: Werner Lean, MD;  Location: [facility];  Service: Cardiovascular;  Laterality: N/A;  ? CHOLECYSTECTOMY    ?  FOOT SURGERY    ? Hernia repaired    ? ? ?Current Medications: ?Current Meds  ?Medication Sig  ? acetaminophen (TYLENOL) 500 MG tablet Take 1,000 mg by mouth daily as needed for moderate pain or headache (Arthritis).  ? [place]-Glucosamine-Vit D (OSTEO BI-FLEX ONE PER DAY PO) Take 1 tablet by mouth 2 (two) times daily.  ? buPROPion (WELLBUTRIN XL)  300 MG 24 hr tablet Take 300 mg by mouth daily.  ? Casanthranol-Docusate Sodium (LAXATIVE PLUS STOOL SOFTENER PO) Take 2 tablets by mouth at bedtime.  ? Cholecalciferol (VITAMIN D3) 50 MCG (2000 UT) TABS Take 2,000 Units by mouth every morning.  ? Cyanocobalamin (VITAMIN B12) 1000 MCG TBCR Take 1,000 mcg by mouth every morning.  ? DULoxetine (CYMBALTA) 30 MG capsule Take 30 mg by mouth 2 (two) times daily.  ? ELIQUIS 5 MG TABS tablet TAKE 1 TABLET TWICE A DAY  ? fexofenadine (ALLEGRA) 180 MG tablet Take 180 mg by mouth daily.  ? iron polysaccharides (NIFEREX) 150 MG capsule Take 150 mg by mouth every other day.  ? metoprolol tartrate (LOPRESSOR) 25 MG tablet Take 25 mg by mouth 2 (two) times daily.  ? Multiple Vitamins-Minerals (VISION FORMULA PO) Take 1 tablet by mouth every morning. 50 +  ? Omega-3 Fatty Acids (OMEGA-3 FISH OIL PO) Take 1,000 mg by mouth every morning.  ? pantoprazole (PROTONIX) 40 MG tablet Take 40 mg by mouth 2 (two) times daily.  ? Probiotic Product ([place]) CAPS Take 1 capsule by mouth 3 (three) times a week. No set days  ? traMADol (ULTRAM) 50 MG tablet Take 50 mg by mouth every 6 (six) hours as needed for moderate pain or severe pain.  ? Turmeric (QC TUMERIC COMPLEX PO) Take 1 tablet by mouth 3 (three) times a week. No set days  ? [DISCONTINUED] amiodarone (PACERONE) 200 MG tablet Take 1 tablet (200 mg total) by mouth daily.  ? [DISCONTINUED] furosemide (LASIX) 20 MG tablet Take 1 tablet (20 mg total) by mouth every other day.  ? [DISCONTINUED] sacubitril-valsartan (ENTRESTO) 24-26 MG Take 1 tablet by mouth 2 (two) times daily.  ?  ? ?Allergies:   Escitalopram  ? ?Social History  ? ?Socioeconomic History  ? Marital status: Widowed  ?  Spouse name: Not on file  ? Number of children: 3  ? Years of education: Not on file  ? Highest education level: 11th grade  ?Occupational History  ? Occupation: Retired  ?Tobacco Use  ? Smoking status: Never  ? Smokeless tobacco: Never  ?Vaping  Use  ? Vaping Use: Never used  ?Substance and Sexual Activity  ? Alcohol use: Not Currently  ? Drug use: Never  ? Sexual activity: Not on file  ?Other Topics Concern  ? Not on file  ?Social History Narrative  ? Not on file  ? ?Social Determinants of Health  ? ?Financial Resource Strain: Low Risk   ? Difficulty of Paying Living Expenses: Not hard at all  ?Food Insecurity: No Food Insecurity  ? Worried About Charity fundraiser in the Last Year: Never true  ? Ran Out of Food in the Last Year: Never true  ?Transportation Needs: No Transportation Needs  ? Lack of Transportation (Medical): No  ? Lack of Transportation (Non-Medical): No  ?Physical Activity: Not on file  ?Stress: Not on file  ?Social Connections: Not on file  ?  ? ?Family History: ?The patient's Family history is unknown by patient. ? ?ROS:   ?Please see  the history of present illness.    ?(+) Weakness ?(+) Hallucinations ?(+) Easy bleeding ?All other systems reviewed and are negative. ? ?EKGs/Labs/Other Studies Reviewed:   ? ?The following studies were reviewed today: ? ?07/27/2021  Echo ? ?EKG:  EKG is personally reviewed.  ?06/13/2021: Atrial fibrillation, right bundle branch block, ventricular rate 88 bpm ? ?Recent Labs: ?07/28/2021: Magnesium 2.0 ?08/07/2021: ALT 18; B Natriuretic Peptide 458.1; TSH 1.703 ?08/14/2021: BUN 18; Creatinine, Ser 1.15; Hemoglobin 14.2; Platelets 295; Potassium 4.1; Sodium 143  ? ?Recent Lipid Panel ?No results found for: CHOL, TRIG, HDL, CHOLHDL, VLDL, LDLCALC, LDLDIRECT ? ?Physical Exam:   ? ?VS:  BP 124/80   Pulse 61   Ht 5\' 5"  (1.651 m)   Wt 128 lb (58.1 kg)   LMP  (LMP Unknown)   SpO2 96%   BMI 21.30 kg/m?    ? ?Wt Readings from Last 3 Encounters:  ?09/07/21 128 lb (58.1 kg)  ?08/07/21 127 lb 12.8 oz (58 kg)  ?08/01/21 122 lb 3.2 oz (55.4 kg)  ?  ? ?GEN: Well nourished, well developed in no acute distress. Elderly. Thin. ?HEENT: Normal ?NECK: No JVD; No carotid bruits ?LYMPHATICS: No lymphadenopathy ?CARDIAC: RRR, no  murmurs, rubs, gallops ?RESPIRATORY:  Clear to auscultation without rales, wheezing or rhonchi  ?ABDOMEN: Soft, non-tender, non-distended ?MUSCULOSKELETAL:  No edema; No deformity  ?SKIN: Warm and dry ?NEURO

## 2021-09-07 NOTE — Patient Instructions (Addendum)
Medication Instructions:  ?Stop Amiodarone ?Your physician recommends that you continue on your current medications as directed. Please refer to the Current Medication list given to you today. ?*If you need a refill on your cardiac medications before your next appointment, please call your pharmacy* ? ?Lab Work: ?None. ?If you have labs (blood work) drawn today and your tests are completely normal, you will receive your results only by: ?MyChart Message (if you have MyChart) OR ?A paper copy in the mail ?If you have any lab test that is abnormal or we need to change your treatment, we will call you to review the results. ? ?Testing/Procedures: ?None. ? ?Follow-Up: ?At [facility], you and your health needs are our priority.  As part of our continuing mission to provide you with exceptional heart care, we have created designated Provider Care Teams.  These Care Teams include your primary Cardiologist (physician) and Advanced Practice Providers (APPs -  Physician Assistants and Nurse Practitioners) who all work together to provide you with the care you need, when you need it. ? ?Your physician wants you to follow-up in: 6 months with Steffanie Dunn, MD or one of the following Advanced Practice Providers on your designated Care Team:   ? ?Francis Dowse, PA-C ?Casimiro Needle "Mardelle Matte" [place], PA-C ?  You will receive a reminder letter in the mail two months in advance. If you don't receive a letter, please call our office to schedule the follow-up appointment. ? ?We recommend signing up for the patient portal called "MyChart".  Sign up information is provided on this After Visit Summary.  MyChart is used to connect with patients for Virtual Visits (Telemedicine).  Patients are able to view lab/test results, encounter notes, upcoming appointments, etc.  Non-urgent messages can be sent to your provider as well.   ?To learn more about what you can do with MyChart, go to [email or web address].   ? ?Any Other Special Instructions  Will Be Listed Below (If Applicable). ? ? ? ? ?  ? ? ?

## 2021-09-13 ENCOUNTER — Encounter (HOSPITAL_COMMUNITY): Payer: Self-pay

## 2021-09-13 ENCOUNTER — Ambulatory Visit (HOSPITAL_COMMUNITY): Admit: 2021-09-13 | Payer: Medicare Other | Admitting: Cardiovascular Disease

## 2021-09-13 SURGERY — ECHOCARDIOGRAM, TRANSESOPHAGEAL
Anesthesia: Monitor Anesthesia Care

## 2021-09-20 ENCOUNTER — Other Ambulatory Visit: Payer: Self-pay | Admitting: Cardiology

## 2021-09-20 MED ORDER — ENTRESTO 24-26 MG PO TABS
1.0000 | ORAL_TABLET | Freq: Two times a day (BID) | ORAL | 3 refills | Status: DC
Start: 2021-09-20 — End: 2022-01-04

## 2021-09-21 ENCOUNTER — Inpatient Hospital Stay (HOSPITAL_COMMUNITY): Admit: 2021-09-21 | Payer: Medicare Other | Admitting: Cardiology

## 2021-09-21 ENCOUNTER — Encounter (HOSPITAL_COMMUNITY): Payer: Self-pay

## 2021-09-21 DIAGNOSIS — I4891 Unspecified atrial fibrillation: Secondary | ICD-10-CM

## 2021-09-21 SURGERY — LEFT ATRIAL APPENDAGE OCCLUSION
Anesthesia: General

## 2021-09-27 ENCOUNTER — Ambulatory Visit (INDEPENDENT_AMBULATORY_CARE_PROVIDER_SITE_OTHER): Payer: Medicare Other | Admitting: Cardiology

## 2021-09-27 ENCOUNTER — Encounter: Payer: Self-pay | Admitting: Cardiology

## 2021-09-27 VITALS — BP 140/68 | HR 60 | Ht 65.0 in | Wt 128.0 lb

## 2021-09-27 DIAGNOSIS — E782 Mixed hyperlipidemia: Secondary | ICD-10-CM | POA: Diagnosis not present

## 2021-09-27 DIAGNOSIS — I480 Paroxysmal atrial fibrillation: Secondary | ICD-10-CM | POA: Diagnosis not present

## 2021-09-27 DIAGNOSIS — I10 Essential (primary) hypertension: Secondary | ICD-10-CM | POA: Diagnosis not present

## 2021-09-27 NOTE — Progress Notes (Signed)
Cardiology Office Note:    Date:  09/27/2021   ID:  [name], DOB Apr 01, 1941, MRN [id number]  PCP:  Algis Greenhouse, MD  Cardiologist:  Jenne Campus, MD    Referring MD: Algis Greenhouse, MD   No chief complaint on file.   History of Present Illness:    Maria Navarro is a 81 y.o. female with past medical history significant for paroxysmal atrial fibrillation, mild aortic stenosis, essential hypertension, diffuse arthritis.  She is doing follow-up by me for management of her atrial fibrillation.  She converted to sinus rhythm she did see EP team for consideration of Watchman device implantation.  She was actually scheduled for it however end of developed some psychotic events with some hallucinations that are still ongoing and procedures on hold for now initial thinking was that it may be related to amiodarone, amiodarone has been withdrawn in spite of that she still have some hallucinations otherwise denies have any chest pain tightness squeezing pressure burning chest no palpitations dizziness.  Cardiac wise seems to be doing well she seems to be in sinus rhythm today  Past Medical History:  Diagnosis Date   Allergic rhinitis 08/10/2015   Anemia 11/15/2020   Formatting of this note might be different from the original. 10/2020: incidental HGB 11.1, MCV 96, RDW 14   Aortic valve sclerosis 05/14/2019   Formatting of this note might be different from the original. 06/02/2020: ECHO, mild   Atrial fibrillation ([place]) 10/26/2020   Formatting of this note might be different from the original. 10/26/2020: noted pre-op by anesthesia, rate 118, to ED   Benign hypertension 08/18/2015   Chronic bilateral low back pain without sciatica 05/30/2020   Family history of premature coronary artery disease 04/14/2018   GERD (gastroesophageal reflux disease) 08/10/2015   Lumbar disc disease 05/30/2020   Mixed hyperlipidemia 10/03/2015   2018: declined rx 2019: declinded rx   Osteoarthritis 08/10/2015    Osteoporosis 10/03/2015   2008: -1.5 2017: -2.5 hip  Formatting of this note might be different from the original. 2008: -1.5 2017: -2.5 hip 2021: -2.6   Pain syndrome, chronic 08/10/2015   (2004) DX/RX: NSAID (2006) DX: NSAID Gastric ulcer with esoph stricture  (2006) RX: tramadol   Plantar fasciitis 08/18/2016   Recurrent major depressive disorder, in remission ([place]) 08/10/2015   Screening for diabetes mellitus (DM) 10/03/2015    Past Surgical History:  Procedure Laterality Date   APPENDECTOMY     CARDIOVERSION  12/29/2020   CARDIOVERSION N/A 07/25/2021   Procedure: CARDIOVERSION;  Surgeon: Werner Lean, MD;  Location: [facility];  Service: Cardiovascular;  Laterality: N/A;   CHOLECYSTECTOMY     FOOT SURGERY     Hernia repaired      Current Medications: Current Meds  Medication Sig   acetaminophen (TYLENOL) 500 MG tablet Take 1,000 mg by mouth daily as needed for moderate pain or headache (Arthritis).   Boswellia-Glucosamine-Vit D (OSTEO BI-FLEX ONE PER DAY PO) Take 1 tablet by mouth 2 (two) times daily.   buPROPion (WELLBUTRIN XL) 300 MG 24 hr tablet Take 300 mg by mouth daily.   Casanthranol-Docusate Sodium (LAXATIVE PLUS STOOL SOFTENER PO) Take 2 tablets by mouth at bedtime.   Cholecalciferol (VITAMIN D3) 50 MCG (2000 UT) TABS Take 2,000 Units by mouth every morning.   Cyanocobalamin (VITAMIN B12) 1000 MCG TBCR Take 1,000 mcg by mouth every morning.   DULoxetine (CYMBALTA) 30 MG capsule Take 30 mg by mouth 2 (two) times daily.  ELIQUIS 5 MG TABS tablet TAKE 1 TABLET TWICE A DAY   fexofenadine (ALLEGRA) 180 MG tablet Take 180 mg by mouth daily.   furosemide (LASIX) 20 MG tablet Take 1 tablet (20 mg total) by mouth every other day.   iron polysaccharides (NIFEREX) 150 MG capsule Take 150 mg by mouth every other day.   metoprolol tartrate (LOPRESSOR) 25 MG tablet Take 25 mg by mouth 2 (two) times daily.   Multiple Vitamins-Minerals (VISION FORMULA PO) Take 1 tablet by mouth  every morning. 50 +   Omega-3 Fatty Acids (OMEGA-3 FISH OIL PO) Take 1,000 mg by mouth every morning.   pantoprazole (PROTONIX) 40 MG tablet Take 40 mg by mouth 2 (two) times daily.   Probiotic Product ([place]) CAPS Take 1 capsule by mouth 3 (three) times a week. No set days   sacubitril-valsartan (ENTRESTO) 24-26 MG Take 1 tablet by mouth 2 (two) times daily.   traMADol (ULTRAM) 50 MG tablet Take 50 mg by mouth every 6 (six) hours as needed for moderate pain or severe pain.   Turmeric (QC TUMERIC COMPLEX PO) Take 1 tablet by mouth 3 (three) times a week. No set days     Allergies:   Escitalopram   Social History   Socioeconomic History   Marital status: Widowed    Spouse name: Not on file   Number of children: 3   Years of education: Not on file   Highest education level: 11th grade  Occupational History   Occupation: Retired  Tobacco Use   Smoking status: Former    Types: Cigarettes    Quit date: 1988    Years since quitting: 35.4   Smokeless tobacco: Never  Vaping Use   Vaping Use: Never used  Substance and Sexual Activity   Alcohol use: Not Currently   Drug use: Never   Sexual activity: Not on file  Other Topics Concern   Not on file  Social History Narrative   Not on file   Social Determinants of Health   Financial Resource Strain: Low Risk    Difficulty of Paying Living Expenses: Not hard at all  Food Insecurity: No Food Insecurity   Worried About Charity fundraiser in the Last Year: Never true   Arboriculturist in the Last Year: Never true  Transportation Needs: No Transportation Needs   Lack of Transportation (Medical): No   Lack of Transportation (Non-Medical): No  Physical Activity: Not on file  Stress: Not on file  Social Connections: Not on file     Family History: The patient's Family history is unknown by patient. ROS:   Please see the history of present illness.    All 14 point review of systems negative except as described per  history of present illness  EKGs/Labs/Other Studies Reviewed:      Recent Labs: 07/28/2021: Magnesium 2.0 08/07/2021: ALT 18; B Natriuretic Peptide 458.1; TSH 1.703 08/14/2021: BUN 18; Creatinine, Ser 1.15; Hemoglobin 14.2; Platelets 295; Potassium 4.1; Sodium 143  Recent Lipid Panel No results found for: CHOL, TRIG, HDL, CHOLHDL, VLDL, LDLCALC, LDLDIRECT  Physical Exam:    VS:  BP 140/68 (BP Location: Right Arm, Patient Position: Sitting, Cuff Size: Normal)   Pulse 60   Ht 5\' 5"  (1.651 m)   Wt 128 lb (58.1 kg)   LMP  (LMP Unknown)   SpO2 96%   BMI 21.30 kg/m     Wt Readings from Last 3 Encounters:  09/27/21 128 lb (58.1 kg)  09/07/21 128 lb (58.1 kg)  08/07/21 127 lb 12.8 oz (58 kg)     GEN:  Well nourished, well developed in no acute distress HEENT: Normal NECK: No JVD; No carotid bruits LYMPHATICS: No lymphadenopathy CARDIAC: RRR, soft systolic ejection murmur grade 1/6 best heard right upper portion of the sternum, no rubs, no gallops RESPIRATORY:  Clear to auscultation without rales, wheezing or rhonchi  ABDOMEN: Soft, non-tender, non-distended MUSCULOSKELETAL:  No edema; No deformity  SKIN: Warm and dry LOWER EXTREMITIES: no swelling NEUROLOGIC:  Alert and oriented x 3 PSYCHIATRIC:  Normal affect   ASSESSMENT:    1. Benign hypertension   2. Paroxysmal atrial fibrillation (HCC)   3. Mixed hyperlipidemia    PLAN:    In order of problems listed above:  Paroxysmal atrial fibrillation we will do EKG to confirm the rhythm.  She is on Eliquis according to the list that she is taking.  When she seems to be tolerating this well.  Previously been discontinued because of her disease as well as hematuria.  But so far seems to be tolerating well.  We will verify make sure she take that medication Hallucinations still ongoing.  Watchman device is on hold.  Amiodarone has been withdrawn in spite of that look like she still maintaining sinus rhythm which will confirm by doing  EKG Mixed dyslipidemia, cholesterol done in November of last year show LDL 98 HDL 68.  She is not on a statin and I will not add any statin right now since I do not want to complicate the situation of anymore with her hallucinations. Blood pressure slightly on the higher side but acceptable.   Medication Adjustments/Labs and Tests Ordered: Current medicines are reviewed at length with the patient today.  Concerns regarding medicines are outlined above.  No orders of the defined types were placed in this encounter.  Medication changes: No orders of the defined types were placed in this encounter.   Signed, Park Liter, MD, [facility] 09/27/2021 11:21 AM    [place]

## 2021-09-27 NOTE — Patient Instructions (Signed)
Medication Instructions:  Your physician recommends that you continue on your current medications as directed. Please refer to the Current Medication list given to you today.  *If you need a refill on your cardiac medications before your next appointment, please call your pharmacy*   Lab Work: None Ordered If you have labs (blood work) drawn today and your tests are completely normal, you will receive your results only by: MyChart Message (if you have MyChart) OR A paper copy in the mail If you have any lab test that is abnormal or we need to change your treatment, we will call you to review the results.   Testing/Procedures: None Ordered   Follow-Up: At [facility], you and your health needs are our priority.  As part of our continuing mission to provide you with exceptional heart care, we have created designated Provider Care Teams.  These Care Teams include your primary Cardiologist (physician) and Advanced Practice Providers (APPs -  Physician Assistants and Nurse Practitioners) who all work together to provide you with the care you need, when you need it.  We recommend signing up for the patient portal called "MyChart".  Sign up information is provided on this After Visit Summary.  MyChart is used to connect with patients for Virtual Visits (Telemedicine).  Patients are able to view lab/test results, encounter notes, upcoming appointments, etc.  Non-urgent messages can be sent to your provider as well.   To learn more about what you can do with MyChart, go to [email or web address].    Your next appointment:   2 month(s)  The format for your next appointment:   In Person  Provider:   Robert Krasowski, MD    Other Instructions NA  

## 2021-09-28 NOTE — Addendum Note (Signed)
Addended by: Roosvelt Harps R on: 09/28/2021 10:45 AM   Modules accepted: Orders

## 2021-10-03 ENCOUNTER — Other Ambulatory Visit: Payer: Self-pay

## 2021-10-03 ENCOUNTER — Telehealth: Payer: Self-pay | Admitting: Cardiology

## 2021-10-03 ENCOUNTER — Telehealth: Payer: Self-pay

## 2021-10-03 MED ORDER — METOPROLOL TARTRATE 25 MG PO TABS: 25.0000 mg | ORAL_TABLET | Freq: Two times a day (BID) | ORAL | 3 refills | Status: DC

## 2021-10-03 NOTE — Telephone Encounter (Signed)
*  STAT* If patient is at the pharmacy, call can be transferred to refill team.   1. Which medications need to be refilled? (please list name of each medication and dose if known)  metoprolol tartrate (LOPRESSOR) 25 MG tablet  2. Which pharmacy/location (including street and city if local pharmacy) is medication to be sent to? [facility] - [place], [place] - [address]  3. Do they need a 30 day or 90 day supply?  90 day supply  

## 2021-10-03 NOTE — Telephone Encounter (Signed)
Metoprolol 25 BID #180 ref x 3 sent to [facility] per pt request

## 2021-10-04 ENCOUNTER — Other Ambulatory Visit: Payer: Self-pay

## 2021-10-04 MED ORDER — FUROSEMIDE 20 MG PO TABS: 20.0000 mg | ORAL_TABLET | ORAL | 3 refills | Status: DC

## 2021-10-17 ENCOUNTER — Telehealth: Payer: Self-pay | Admitting: Cardiology

## 2021-10-17 ENCOUNTER — Telehealth: Payer: Self-pay

## 2021-10-17 NOTE — Telephone Encounter (Signed)
Patient calling in to say that she still is experiencing  hallucinating, feels drunk often and falling.

## 2021-10-17 NOTE — Telephone Encounter (Signed)
Spoke with patient who reports she is still experiencing the same symptoms she had when she last saw Dr. Lalla Brothers on 09/07/21.  Patient reports she is hallucinating, staggering, and "feels drunk all the time." Patient reports she is falling, unsure of how many times, denies any injuries. Patient reports 2 different falls when bending over to pick up an object.   Patient states she stopped taking Amiodarone as instructed on 09/07/21 with no improvement in the above symptoms.  Patient states she has an appointment with her PCP in July.  Will forward to Pharm D and Dr. Lalla Brothers to review and advise.

## 2021-10-18 NOTE — Telephone Encounter (Signed)
Unfortunately, amiodarone has a very long half life (23-102 days) therefore it is possible the her symptoms could still be from amiodarone.  Symptoms could be related to bupropion, Cymbalta or possible metoprolol.   I think a work up from a physiatrist is warranted however.

## 2021-10-18 NOTE — Telephone Encounter (Signed)
Spoke with the patient and gave her information from [place], [facility]. Patient verbalized understanding.

## 2021-10-19 NOTE — Telephone Encounter (Signed)
Spoke to pt and she wanted message sent to Dr. Lalla Brothers who she saw last to discuss her symptoms.

## 2021-11-08 DIAGNOSIS — I5020 Unspecified systolic (congestive) heart failure: Secondary | ICD-10-CM | POA: Insufficient documentation

## 2021-11-10 ENCOUNTER — Other Ambulatory Visit: Payer: Self-pay

## 2021-11-10 MED ORDER — FUROSEMIDE 20 MG PO TABS: 20.0000 mg | ORAL_TABLET | ORAL | 3 refills | Status: DC

## 2021-11-15 ENCOUNTER — Other Ambulatory Visit: Payer: Self-pay

## 2021-11-15 MED ORDER — FUROSEMIDE 20 MG PO TABS: 20.0000 mg | ORAL_TABLET | ORAL | 3 refills | Status: DC

## 2021-11-28 ENCOUNTER — Ambulatory Visit (INDEPENDENT_AMBULATORY_CARE_PROVIDER_SITE_OTHER): Payer: Medicare Other | Admitting: Cardiology

## 2021-11-28 ENCOUNTER — Ambulatory Visit (INDEPENDENT_AMBULATORY_CARE_PROVIDER_SITE_OTHER): Payer: Medicare Other

## 2021-11-28 ENCOUNTER — Encounter: Payer: Self-pay | Admitting: Cardiology

## 2021-11-28 VITALS — BP 120/68 | HR 68 | Ht 65.0 in | Wt 122.0 lb

## 2021-11-28 DIAGNOSIS — R42 Dizziness and giddiness: Secondary | ICD-10-CM

## 2021-11-28 DIAGNOSIS — I480 Paroxysmal atrial fibrillation: Secondary | ICD-10-CM | POA: Diagnosis not present

## 2021-11-28 DIAGNOSIS — I10 Essential (primary) hypertension: Secondary | ICD-10-CM

## 2021-11-28 DIAGNOSIS — E782 Mixed hyperlipidemia: Secondary | ICD-10-CM

## 2021-11-28 MED ORDER — APIXABAN 2.5 MG PO TABS: 2.5000 mg | ORAL_TABLET | Freq: Two times a day (BID) | ORAL | 2 refills | Status: DC

## 2021-11-28 NOTE — Progress Notes (Unsigned)
Cardiology Office Note:    Date:  11/28/2021   ID:  [name], DOB 12/22/1940, MRN [id number]  PCP:  Algis Greenhouse, MD  Cardiologist:  Jenne Campus, MD    Referring MD: Algis Greenhouse, MD   Chief Complaint  Patient presents with   Follow-up    History of Present Illness:    Maria Navarro is a 81 y.o. female   with past medical history significant for paroxysmal atrial fibrillation, mild aortic stenosis, essential hypertension, diffuse arthritis.  She is doing follow-up by me for management of her atrial fibrillation.  She converted to sinus rhythm she did see EP team for consideration of Watchman device implantation.  She was actually scheduled for it however end of developed some psychotic events with some hallucinations that are still ongoing and procedures on hold for now initial thinking was that it may be related to amiodarone, amiodarone has been withdrawn in spite of that she still have some hallucinations otherwise denies have any chest pain tightness squeezing pressure burning chest no palpitations dizziness. Today she described her episode of dizziness.  It happens usually when she gets up very quickly.  She notices when she does not take her Lasix she is feeling much better.  Denies have any palpitations.  Hallucinations still happening she is very fine about it she understand the dose just hallucinations and she said I simply ignore it now.  Denies have any palpitations.  Described also to have some episode of shortness of breath  Past Medical History:  Diagnosis Date   Allergic rhinitis 08/10/2015   Anemia 11/15/2020   Formatting of this note might be different from the original. 10/2020: incidental HGB 11.1, MCV 96, RDW 14   Aortic valve sclerosis 05/14/2019   Formatting of this note might be different from the original. 06/02/2020: ECHO, mild   Atrial fibrillation ([place]) 10/26/2020   Formatting of this note might be different from the original. 10/26/2020: noted pre-op by  anesthesia, rate 118, to ED   Benign hypertension 08/18/2015   Chronic bilateral low back pain without sciatica 05/30/2020   Family history of premature coronary artery disease 04/14/2018   GERD (gastroesophageal reflux disease) 08/10/2015   Lumbar disc disease 05/30/2020   Mixed hyperlipidemia 10/03/2015   2018: declined rx 2019: declinded rx   Osteoarthritis 08/10/2015   Osteoporosis 10/03/2015   2008: -1.5 2017: -2.5 hip  Formatting of this note might be different from the original. 2008: -1.5 2017: -2.5 hip 2021: -2.6   Pain syndrome, chronic 08/10/2015   (2004) DX/RX: NSAID (2006) DX: NSAID Gastric ulcer with esoph stricture  (2006) RX: tramadol   Plantar fasciitis 08/18/2016   Recurrent major depressive disorder, in remission ([place]) 08/10/2015   Screening for diabetes mellitus (DM) 10/03/2015    Past Surgical History:  Procedure Laterality Date   APPENDECTOMY     CARDIOVERSION  12/29/2020   CARDIOVERSION N/A 07/25/2021   Procedure: CARDIOVERSION;  Surgeon: Werner Lean, MD;  Location: [facility];  Service: Cardiovascular;  Laterality: N/A;   CHOLECYSTECTOMY     FOOT SURGERY     Hernia repaired      Current Medications: Current Meds  Medication Sig   acetaminophen (TYLENOL) 500 MG tablet Take 1,000 mg by mouth daily as needed for moderate pain or headache (Arthritis).   Boswellia-Glucosamine-Vit D (OSTEO BI-FLEX ONE PER DAY PO) Take 1 tablet by mouth 2 (two) times daily.   buPROPion (WELLBUTRIN XL) 300 MG 24 hr tablet Take 300 mg  by mouth daily.   Casanthranol-Docusate Sodium (LAXATIVE PLUS STOOL SOFTENER PO) Take 2 tablets by mouth at bedtime.   Cholecalciferol (VITAMIN D3) 50 MCG (2000 UT) TABS Take 2,000 Units by mouth every morning.   Cyanocobalamin (VITAMIN B12) 1000 MCG TBCR Take 1,000 mcg by mouth every morning.   ELIQUIS 5 MG TABS tablet TAKE 1 TABLET TWICE A DAY   fexofenadine (ALLEGRA) 180 MG tablet Take 180 mg by mouth daily.   furosemide (LASIX) 20 MG tablet Take  1 tablet (20 mg total) by mouth every other day.   furosemide (LASIX) 20 MG tablet Take 20 mg by mouth daily as needed for fluid or edema.   iron polysaccharides (NIFEREX) 150 MG capsule Take 150 mg by mouth every other day.   metoprolol tartrate (LOPRESSOR) 25 MG tablet Take 12.5 mg by mouth 2 (two) times daily.   Multiple Vitamins-Minerals (VISION FORMULA PO) Take 1 tablet by mouth every morning. 50 +   Omega-3 Fatty Acids (OMEGA-3 FISH OIL PO) Take 1,000 mg by mouth every morning.   pantoprazole (PROTONIX) 40 MG tablet Take 40 mg by mouth 2 (two) times daily.   Probiotic Product ([place] [facility]) CAPS Take 1 capsule by mouth 3 (three) times a week. No set days   sacubitril-valsartan (ENTRESTO) 24-26 MG Take 1 tablet by mouth 2 (two) times daily.   traMADol (ULTRAM) 50 MG tablet Take 50 mg by mouth every 6 (six) hours as needed for moderate pain or severe pain.   Turmeric (QC TUMERIC COMPLEX PO) Take 1 tablet by mouth 3 (three) times a week. No set days     Allergies:   Escitalopram   Social History   Socioeconomic History   Marital status: Widowed    Spouse name: Not on file   Number of children: 3   Years of education: Not on file   Highest education level: 11th grade  Occupational History   Occupation: Retired  Tobacco Use   Smoking status: Former    Types: Cigarettes    Quit date: 1988    Years since quitting: 35.6   Smokeless tobacco: Never  Vaping Use   Vaping Use: Never used  Substance and Sexual Activity   Alcohol use: Not Currently   Drug use: Never   Sexual activity: Not on file  Other Topics Concern   Not on file  Social History Narrative   Not on file   Social Determinants of Health   Financial Resource Strain: Low Risk  (07/27/2021)   Overall Financial Resource Strain (CARDIA)    Difficulty of Paying Living Expenses: Not hard at all  Food Insecurity: No Food Insecurity (07/27/2021)   Hunger Vital Sign    Worried About Running Out of Food in the Last  Year: Never true    Ran Out of Food in the Last Year: Never true  Transportation Needs: No Transportation Needs (07/27/2021)   PRAPARE - Administrator, Civil Service (Medical): No    Lack of Transportation (Non-Medical): No  Physical Activity: Not on file  Stress: Not on file  Social Connections: Not on file     Family History: The patient's Family history is unknown by patient. ROS:   Please see the history of present illness.    All 14 point review of systems negative except as described per history of present illness  EKGs/Labs/Other Studies Reviewed:      Recent Labs: 07/28/2021: Magnesium 2.0 08/07/2021: ALT 18; B Natriuretic Peptide 458.1; TSH 1.703 08/14/2021:  BUN 18; Creatinine, Ser 1.15; Hemoglobin 14.2; Platelets 295; Potassium 4.1; Sodium 143  Recent Lipid Panel No results found for: "CHOL", "TRIG", "HDL", "CHOLHDL", "VLDL", "LDLCALC", "LDLDIRECT"  Physical Exam:    VS:  BP 120/68 (BP Location: Left Arm, Patient Position: Standing, Cuff Size: Small)   Pulse 68   Ht 5\' 5"  (1.651 m)   Wt 122 lb (55.3 kg)   LMP  (LMP Unknown)   SpO2 91%   BMI 20.30 kg/m     Wt Readings from Last 3 Encounters:  11/28/21 122 lb (55.3 kg)  09/27/21 128 lb (58.1 kg)  09/07/21 128 lb (58.1 kg)     GEN:  Well nourished, well developed in no acute distress HEENT: Normal NECK: No JVD; No carotid bruits LYMPHATICS: No lymphadenopathy CARDIAC: RRR, no murmurs, no rubs, no gallops RESPIRATORY:  Clear to auscultation without rales, wheezing or rhonchi  ABDOMEN: Soft, non-tender, non-distended MUSCULOSKELETAL:  No edema; No deformity  SKIN: Warm and dry LOWER EXTREMITIES: no swelling NEUROLOGIC:  Alert and oriented x 3 PSYCHIATRIC:  Normal affect   ASSESSMENT:    1. Paroxysmal atrial fibrillation (HCC)   2. Mixed hyperlipidemia   3. Benign hypertension    PLAN:    In order of problems listed above:  Paroxysmal atrial fibrillation.  Seems to maintain sinus rhythm  ask her to wear Zio patch to see if there is any correlation between her dizziness and arrhythmia.  However I suspect the dizziness is related most likely to orthostatic hypotension, Mixed dyslipidemia that being followed by internal medicine team Essential hypertension blood pressure seems to be controlled with some orthostatic changes.  Ask her to take Lasix only on as-needed basis and she is being already doing it. Anticoagulation she is on 5 mg twice daily of Eliquis which is too high.  Her dose is 2.5 twice daily we will reduce the dose.  There was a conversation about Watchman device because previously having some hematuria and other bleeding complications but surprisingly she is able to tolerate her Eliquis right now quite well.  Continue present management   Medication Adjustments/Labs and Tests Ordered: Current medicines are reviewed at length with the patient today.  Concerns regarding medicines are outlined above.  No orders of the defined types were placed in this encounter.  Medication changes: No orders of the defined types were placed in this encounter.   Signed, 11/07/21, MD, [facility] 11/28/2021 1:16 PM    [facility]

## 2021-11-28 NOTE — Patient Instructions (Signed)
Medication Instructions:  Your physician has recommended you make the following change in your medication:  Reduce Eliquis to 2.5 mg two times daily  *If you need a refill on your cardiac medications before your next appointment, please call your pharmacy*   Lab Work: NONE If you have labs (blood work) drawn today and your tests are completely normal, you will receive your results only by: MyChart Message (if you have MyChart) OR A paper copy in the mail If you have any lab test that is abnormal or we need to change your treatment, we will call you to review the results.   Testing/Procedures: You have been asked to wear a Zio Heart Monitor today. It is to be worn for 14 days. Please remove the monitor on Aug. 15th and mail back in the box provided.  If you have any questions about the monitor please call the company at [phone]     Follow-Up: At [facility], you and your health needs are our priority.  As part of our continuing mission to provide you with exceptional heart care, we have created designated Provider Care Teams.  These Care Teams include your primary Cardiologist (physician) and Advanced Practice Providers (APPs -  Physician Assistants and Nurse Practitioners) who all work together to provide you with the care you need, when you need it.  We recommend signing up for the patient portal called "MyChart".  Sign up information is provided on this After Visit Summary.  MyChart is used to connect with patients for Virtual Visits (Telemedicine).  Patients are able to view lab/test results, encounter notes, upcoming appointments, etc.  Non-urgent messages can be sent to your provider as well.   To learn more about what you can do with MyChart, go to [email or web address].    Your next appointment:   6 month(s)  The format for your next appointment:   In Person  Provider:   Gypsy Balsam, MD    Other Instructions   Important Information About Sugar

## 2022-01-04 ENCOUNTER — Telehealth: Payer: Self-pay

## 2022-01-04 MED ORDER — ENTRESTO 24-26 MG PO TABS: 1.0000 | ORAL_TABLET | Freq: Two times a day (BID) | ORAL | 3 refills | Status: DC

## 2022-01-04 MED ORDER — METOPROLOL TARTRATE 25 MG PO TABS: 25.0000 mg | ORAL_TABLET | Freq: Two times a day (BID) | ORAL | 0 refills | Status: DC

## 2022-01-04 NOTE — Telephone Encounter (Signed)
-----   Message from Maria Lea, MD sent at 01/01/2022  8:27 PM EDT ----- Multiple episode of supraventricular tachycardia, please increase dose of metoprolol to 25 twice daily

## 2022-01-04 NOTE — Telephone Encounter (Signed)
Patient notified of results and recommendations and agreed with plan. Rx sent and Sherryll Burger was sent per patient request.

## 2022-02-22 ENCOUNTER — Telehealth: Payer: Self-pay | Admitting: Cardiology

## 2022-02-22 NOTE — Telephone Encounter (Signed)
Pt c/o medication issue:  1. Name of Medication: Pacerone  2. How are you currently taking this medication (dosage and times per day)?   3. Are you having a reaction (difficulty breathing--STAT)?   4. What is your medication issue? Pt states that she is not sure which medications she should be taking. She states that this medication has not been called in for her, but another medication that she does not recognize has been sent to her. She is requesting call back to go over all of her medications.

## 2022-02-22 NOTE — Telephone Encounter (Signed)
Returned call to patient of Dr. Agustin Cree regarding medications  Amiodarone (Pacerone) was received from [facility] - this was stopped by Dr. Quentin Ore in May 2023. She will discard  She reports after monitor she was told to increase metoprolol tartrate to 50mg . Instructions said to increase to 25mg  BID per 01/04/22 phone note. Office visit prior to this indicated meto tart 12.5mg  BID. She said she has 50mg  tablets at home and needs a refill - she had been cutting in half and taking 25mg  BID. She uses [facility]. Will send to MD to review since there is a med discrepancy  SBP has been as low as 88 - highest 120 SBP was 103 at a PCP visit

## 2022-02-23 MED ORDER — METOPROLOL TARTRATE 50 MG PO TABS: 50.0000 mg | ORAL_TABLET | Freq: Two times a day (BID) | ORAL | 3 refills | Status: DC

## 2022-02-23 MED ORDER — METOPROLOL TARTRATE 25 MG PO TABS: 25.0000 mg | ORAL_TABLET | Freq: Two times a day (BID) | ORAL | 3 refills | Status: DC

## 2022-02-23 NOTE — Addendum Note (Signed)
Addended by: Jacobo Forest D on: 02/23/2022 12:12 PM   Modules accepted: Orders

## 2022-02-27 NOTE — Telephone Encounter (Signed)
Park Liter, MD  Fidel Levy, RN Caller: Unspecified (5 days ago, 12:45 PM) Lets put her on 25 mg of the Toprol tartrate twice daily

## 2022-03-01 ENCOUNTER — Ambulatory Visit: Payer: Medicare Other | Admitting: Cardiology

## 2022-03-01 NOTE — Progress Notes (Deleted)
Electrophysiology Office Follow up Visit Note:    Date:  03/01/2022   ID:  Maria Navarro, DOB 1941-04-30, MRN [id number]  PCP:  Olive Bass, MD  [facility] Cardiologist:  Garwin Brothers, MD  [facility] Electrophysiologist:  Lanier Prude, MD    Interval History:    Maria Navarro is a 81 y.o. female who presents for a follow up visit.   I last saw her 09/21/2021. The last appointment she reported hallucinations and general malaise. Given her mental status and general state of health at the last appointment, the planned Watchman procedure was cancelled and she was instructed to continue on Eliquis. Amiodarone was stopped.   She saw Dr Bing Matter on 09/27/2021. Was still having hallucinations.      Past Medical History:  Diagnosis Date   Allergic rhinitis 08/10/2015   Anemia 11/15/2020   Formatting of this note might be different from the original. 10/2020: incidental HGB 11.1, MCV 96, RDW 14   Aortic valve sclerosis 05/14/2019   Formatting of this note might be different from the original. 06/02/2020: ECHO, mild   Atrial fibrillation (HCC) 10/26/2020   Formatting of this note might be different from the original. 10/26/2020: noted pre-op by anesthesia, rate 118, to ED   Benign hypertension 08/18/2015   Chronic bilateral low back pain without sciatica 05/30/2020   Family history of premature coronary artery disease 04/14/2018   GERD (gastroesophageal reflux disease) 08/10/2015   Lumbar disc disease 05/30/2020   Mixed hyperlipidemia 10/03/2015   2018: declined rx 2019: declinded rx   Osteoarthritis 08/10/2015   Osteoporosis 10/03/2015   2008: -1.5 2017: -2.5 hip  Formatting of this note might be different from the original. 2008: -1.5 2017: -2.5 hip 2021: -2.6   Pain syndrome, chronic 08/10/2015   (2004) DX/RX: NSAID (2006) DX: NSAID Gastric ulcer with esoph stricture  (2006) RX: tramadol   Plantar fasciitis 08/18/2016   Recurrent major depressive disorder, in remission (HCC)  08/10/2015   Screening for diabetes mellitus (DM) 10/03/2015    Past Surgical History:  Procedure Laterality Date   APPENDECTOMY     CARDIOVERSION  12/29/2020   CARDIOVERSION N/A 07/25/2021   Procedure: CARDIOVERSION;  Surgeon: Christell Constant, MD;  Location: [facility];  Service: Cardiovascular;  Laterality: N/A;   CHOLECYSTECTOMY     FOOT SURGERY     Hernia repaired      Current Medications: No outpatient medications have been marked as taking for the 03/01/22 encounter (Appointment) with Lanier Prude, MD.     Allergies:   Escitalopram   Social History   Socioeconomic History   Marital status: Widowed    Spouse name: Not on file   Number of children: 3   Years of education: Not on file   Highest education level: 11th grade  Occupational History   Occupation: Retired  Tobacco Use   Smoking status: Former    Types: Cigarettes    Quit date: 1988    Years since quitting: 35.8   Smokeless tobacco: Never  Vaping Use   Vaping Use: Never used  Substance and Sexual Activity   Alcohol use: Not Currently   Drug use: Never   Sexual activity: Not on file  Other Topics Concern   Not on file  Social History Narrative   Not on file   Social Determinants of Health   Financial Resource Strain: Low Risk  (07/27/2021)   Overall Financial Resource Strain (CARDIA)    Difficulty of Paying Living  Expenses: Not hard at all  Food Insecurity: No Food Insecurity (07/27/2021)   Hunger Vital Sign    Worried About Running Out of Food in the Last Year: Never true    Ran Out of Food in the Last Year: Never true  Transportation Needs: No Transportation Needs (07/27/2021)   PRAPARE - Hydrologist (Medical): No    Lack of Transportation (Non-Medical): No  Physical Activity: Not on file  Stress: Not on file  Social Connections: Not on file     Family History: The patient's Family history is unknown by patient.  ROS:   Please see the history of  present illness.    All other systems reviewed and are negative.  EKGs/Labs/Other Studies Reviewed:    The following studies were reviewed today:     Recent Labs: 07/28/2021: Magnesium 2.0 08/07/2021: ALT 18; B Natriuretic Peptide 458.1; TSH 1.703 08/14/2021: BUN 18; Creatinine, Ser 1.15; Hemoglobin 14.2; Platelets 295; Potassium 4.1; Sodium 143  Recent Lipid Panel No results found for: "CHOL", "TRIG", "HDL", "CHOLHDL", "VLDL", "LDLCALC", "LDLDIRECT"  Physical Exam:    VS:  LMP  (LMP Unknown)     Wt Readings from Last 3 Encounters:  11/28/21 122 lb (55.3 kg)  09/27/21 128 lb (58.1 kg)  09/07/21 128 lb (58.1 kg)     GEN: *** Well nourished, well developed in no acute distress HEENT: Normal NECK: No JVD; No carotid bruits LYMPHATICS: No lymphadenopathy CARDIAC: ***RRR, no murmurs, rubs, gallops RESPIRATORY:  Clear to auscultation without rales, wheezing or rhonchi  ABDOMEN: Soft, non-tender, non-distended MUSCULOSKELETAL:  No edema; No deformity  SKIN: Warm and dry NEUROLOGIC:  Alert and oriented x 3 PSYCHIATRIC:  Normal affect        ASSESSMENT:    1. Paroxysmal atrial fibrillation (HCC)   2. Chronic heart failure with preserved ejection fraction (HCC)   3. Hallucinations   4. Primary hypertension    PLAN:    In order of problems listed above:  #AF  #HFpEF  #Hallucinations  #Hypertension *** goal today.  Recommend checking blood pressures 1-2 times per week at home and recording the values.  Recommend bringing these recordings to the primary care physician.   Follow up prn w EP   Medication Adjustments/Labs and Tests Ordered: Current medicines are reviewed at length with the patient today.  Concerns regarding medicines are outlined above.  No orders of the defined types were placed in this encounter.  No orders of the defined types were placed in this encounter.    Signed, Lars Mage, MD, [facility], [facility] 03/01/2022 5:17 AM     Electrophysiology [facility]

## 2022-05-25 ENCOUNTER — Other Ambulatory Visit: Payer: Self-pay

## 2022-05-29 ENCOUNTER — Ambulatory Visit: Payer: Medicare Other | Attending: Cardiology | Admitting: Cardiology

## 2022-05-29 ENCOUNTER — Encounter: Payer: Self-pay | Admitting: Cardiology

## 2022-05-29 VITALS — BP 130/60 | HR 71 | Ht 65.5 in | Wt 127.6 lb

## 2022-05-29 DIAGNOSIS — I480 Paroxysmal atrial fibrillation: Secondary | ICD-10-CM | POA: Insufficient documentation

## 2022-05-29 DIAGNOSIS — I10 Essential (primary) hypertension: Secondary | ICD-10-CM | POA: Insufficient documentation

## 2022-05-29 DIAGNOSIS — F23 Brief psychotic disorder: Secondary | ICD-10-CM | POA: Diagnosis present

## 2022-05-29 DIAGNOSIS — I358 Other nonrheumatic aortic valve disorders: Secondary | ICD-10-CM | POA: Diagnosis not present

## 2022-05-29 DIAGNOSIS — E782 Mixed hyperlipidemia: Secondary | ICD-10-CM | POA: Diagnosis not present

## 2022-05-29 NOTE — Progress Notes (Signed)
Cardiology Office Note:    Date:  05/29/2022   ID:  Maria Navarro, DOB Mar 29, 1941, MRN [id number]  PCP:  Algis Greenhouse, MD  Cardiologist:  Jenne Campus, MD    Referring MD: Algis Greenhouse, MD   Chief Complaint  Patient presents with   Follow-up    History of Present Illness:    Maria Navarro is a 82 y.o. female with past medical history significant for paroxysmal atrial fibrillation, mild aortic stenosis, essential hypertension, diffuse arthritis, she was anticoagulated for atrial fibrillation, she was scheduled to have Watchman device she was also put on amiodarone and then developed some brief psychotic event with hallucination.  I have admitted her husband with no last time I seen her which was seen in summer of last year she was much better but still having some hallucinations today she comes in she says she is doing quite well.  Denies having any chest pain tightness squeezing pressure burning chest, shortness of breath is still present but denies having any palpitations.  She is also taking Eliquis with no problems.  Past Medical History:  Diagnosis Date   Allergic rhinitis 08/10/2015   Anemia 11/15/2020   Formatting of this note might be different from the original. 10/2020: incidental HGB 11.1, MCV 96, RDW 14   Aortic valve sclerosis 05/14/2019   Formatting of this note might be different from the original. 06/02/2020: ECHO, mild   Atrial fibrillation ([place]) 10/26/2020   Formatting of this note might be different from the original. 10/26/2020: noted pre-op by anesthesia, rate 118, to ED   Benign hypertension 08/18/2015   Chronic bilateral low back pain without sciatica 05/30/2020   Family history of premature coronary artery disease 04/14/2018   GERD (gastroesophageal reflux disease) 08/10/2015   Lumbar disc disease 05/30/2020   Mixed hyperlipidemia 10/03/2015   2018: declined rx 2019: declinded rx   Osteoarthritis 08/10/2015   Osteoporosis 10/03/2015   2008: -1.5 2017: -2.5 hip   Formatting of this note might be different from the original. 2008: -1.5 2017: -2.5 hip 2021: -2.6   Pain syndrome, chronic 08/10/2015   (2004) DX/RX: NSAID (2006) DX: NSAID Gastric ulcer with esoph stricture  (2006) RX: tramadol   Plantar fasciitis 08/18/2016   Recurrent major depressive disorder, in remission ([place]) 08/10/2015   Screening for diabetes mellitus (DM) 10/03/2015    Past Surgical History:  Procedure Laterality Date   APPENDECTOMY     CARDIOVERSION  12/29/2020   CARDIOVERSION N/A 07/25/2021   Procedure: CARDIOVERSION;  Surgeon: Werner Lean, MD;  Location: [facility];  Service: Cardiovascular;  Laterality: N/A;   CHOLECYSTECTOMY     FOOT SURGERY     Hernia repaired      Current Medications: Current Meds  Medication Sig   acetaminophen (TYLENOL) 500 MG tablet Take 1,000 mg by mouth daily as needed for moderate pain or headache (Arthritis).   apixaban (ELIQUIS) 2.5 MG TABS tablet Take 1 tablet (2.5 mg total) by mouth 2 (two) times daily.   Boswellia-Glucosamine-Vit D (OSTEO BI-FLEX ONE PER DAY PO) Take 1 tablet by mouth 2 (two) times daily.   buPROPion (WELLBUTRIN XL) 300 MG 24 hr tablet Take 300 mg by mouth daily.   Casanthranol-Docusate Sodium (LAXATIVE PLUS STOOL SOFTENER PO) Take 2 tablets by mouth at bedtime.   Cholecalciferol (VITAMIN D3) 50 MCG (2000 UT) TABS Take 2,000 Units by mouth every morning.   Cyanocobalamin (VITAMIN B12) 1000 MCG TBCR Take 1,000 mcg by mouth every morning.  DULoxetine (CYMBALTA) 30 MG capsule Take 30 mg by mouth 2 (two) times daily.   ELIQUIS 5 MG TABS tablet Take 5 mg by mouth 2 (two) times daily.   fexofenadine (ALLEGRA) 180 MG tablet Take 180 mg by mouth daily.   furosemide (LASIX) 20 MG tablet Take 1 tablet (20 mg total) by mouth every other day.   furosemide (LASIX) 20 MG tablet Take 20 mg by mouth daily as needed for fluid or edema.   iron polysaccharides (NIFEREX) 150 MG capsule Take 150 mg by mouth every other day.    metoprolol tartrate (LOPRESSOR) 25 MG tablet Take 1 tablet (25 mg total) by mouth 2 (two) times daily.   Multiple Vitamins-Minerals (VISION FORMULA PO) Take 1 tablet by mouth every morning. 50 +   Omega-3 Fatty Acids (OMEGA-3 FISH OIL PO) Take 1,000 mg by mouth every morning.   pantoprazole (PROTONIX) 40 MG tablet Take 40 mg by mouth 2 (two) times daily.   Probiotic Product ([place]) CAPS Take 1 capsule by mouth 3 (three) times a week. No set days   sacubitril-valsartan (ENTRESTO) 24-26 MG Take 1 tablet by mouth 2 (two) times daily.   traMADol (ULTRAM) 50 MG tablet Take 50 mg by mouth every 6 (six) hours as needed for moderate pain or severe pain.   Turmeric (QC TUMERIC COMPLEX PO) Take 1 tablet by mouth 3 (three) times a week. No set days   [DISCONTINUED] amiodarone (PACERONE) 200 MG tablet Take 200 mg by mouth daily.     Allergies:   Escitalopram   Social History   Socioeconomic History   Marital status: Widowed    Spouse name: Not on file   Number of children: 3   Years of education: Not on file   Highest education level: 11th grade  Occupational History   Occupation: Retired  Tobacco Use   Smoking status: Former    Types: Cigarettes    Quit date: [place] since quitting: 36.1   Smokeless tobacco: Never  Vaping Use   Vaping Use: Never used  Substance and Sexual Activity   Alcohol use: Not Currently   Drug use: Never   Sexual activity: Not on file  Other Topics Concern   Not on file  Social History Narrative   Not on file   Social Determinants of Health   Financial Resource Strain: Low Risk  (07/27/2021)   Overall Financial Resource Strain (CARDIA)    Difficulty of Paying Living Expenses: Not hard at all  Food Insecurity: No Food Insecurity (07/27/2021)   Hunger Vital Sign    Worried About Running Out of Food in the Last Year: Never true    [place] in the Last Year: Never true  Transportation Needs: No Transportation Needs (07/27/2021)    PRAPARE - Hydrologist (Medical): No    Lack of Transportation (Non-Medical): No  Physical Activity: Not on file  Stress: Not on file  Social Connections: Not on file     Family History: The patient's Family history is unknown by patient. ROS:   Please see the history of present illness.    All 14 point review of systems negative except as described per history of present illness  EKGs/Labs/Other Studies Reviewed:      Recent Labs: 07/28/2021: Magnesium 2.0 08/07/2021: ALT 18; B Natriuretic Peptide 458.1; TSH 1.703 08/14/2021: BUN 18; Creatinine, Ser 1.15; Hemoglobin 14.2; Platelets 295; Potassium 4.1; Sodium 143  Recent Lipid Panel  No results found for: "CHOL", "TRIG", "HDL", "CHOLHDL", "VLDL", "LDLCALC", "LDLDIRECT"  Physical Exam:    VS:  BP 130/60 (BP Location: Left Arm, Patient Position: Sitting)   Pulse 71   Ht 5' 5.5" (1.664 m)   Wt 127 lb 9.6 oz (57.9 kg)   LMP  (LMP Unknown)   SpO2 96%   BMI 20.91 kg/m     Wt Readings from Last 3 Encounters:  05/29/22 127 lb 9.6 oz (57.9 kg)  11/28/21 122 lb (55.3 kg)  09/27/21 128 lb (58.1 kg)     GEN:  Well nourished, well developed in no acute distress HEENT: Normal NECK: No JVD; No carotid bruits LYMPHATICS: No lymphadenopathy CARDIAC: RRR, no murmurs, no rubs, no gallops RESPIRATORY:  Clear to auscultation without rales, wheezing or rhonchi  ABDOMEN: Soft, non-tender, non-distended MUSCULOSKELETAL:  No edema; No deformity  SKIN: Warm and dry LOWER EXTREMITIES: no swelling NEUROLOGIC:  Alert and oriented x 3 PSYCHIATRIC:  Normal affect   ASSESSMENT:    1. Paroxysmal atrial fibrillation (HCC)   2. Benign hypertension   3. Aortic valve sclerosis   4. Mixed hyperlipidemia   5. Brief psychotic disorder ([place])    PLAN:    In order of problems listed above:  Paroxysmal atrial fibrillation, seems like she is maintaining sinus rhythm, will continue present management which includes  Eliquis. Benign essential hypertension: Blood pressure well-controlled continue present management. Aortic valve sclerosis no significant stenosis identified on echocardiogram. Mixed dyslipidemia, I did review fasting lipid profile done by primary care physician, LDL 99 HDL 68.  Overall good combination which I will continue   Medication Adjustments/Labs and Tests Ordered: Current medicines are reviewed at length with the patient today.  Concerns regarding medicines are outlined above.  No orders of the defined types were placed in this encounter.  Medication changes: No orders of the defined types were placed in this encounter.   Signed, Park Liter, MD, [facility] 05/29/2022 1:36 PM    [facility]

## 2022-05-29 NOTE — Patient Instructions (Signed)

## 2022-06-12 ENCOUNTER — Other Ambulatory Visit: Payer: Self-pay | Admitting: Cardiology

## 2022-06-27 ENCOUNTER — Telehealth: Payer: Self-pay | Admitting: Cardiology

## 2022-06-27 NOTE — Telephone Encounter (Signed)
Patient called stating last night she was reading her left hand and wrist swelled up and turned red, and was hot to the touch.  She said she couldn't bend her fingers or wrist.  She said the swelling has gone away down and the redness has gone away. She is wondering if she could have a blood clot. She can bend her fingers today, and her wrist.  She said she still has some soreness in her wrist and some swelling.

## 2022-06-27 NOTE — Telephone Encounter (Signed)
Pt reported swelling of wrist, hot to touch and red last evening. The symptoms have resolved. She has no other symptoms. Encouraged pt to see her PCP if she has further symptoms or the swelling returns. Pt denied any other symptoms. Pt agreed and verbalized understanding.

## 2022-08-02 ENCOUNTER — Other Ambulatory Visit: Payer: Self-pay | Admitting: Cardiology

## 2022-08-02 DIAGNOSIS — I480 Paroxysmal atrial fibrillation: Secondary | ICD-10-CM

## 2022-08-02 NOTE — Telephone Encounter (Signed)
Prescription refill request for Eliquis received. Indication: AF Last office visit: 05/29/22  Nelta Numbers MD Scr: 1.15 on 08/14/21 Age: 82 Weight: 57.9kg  Pt is taking Eliquis 5mg  daily.  Based on above findings Eliquis 2.5mg  twice daily would be the appropriate dose based on age and weight.  Message sent to PharmD Pool to [facility] on dose reduction.  Pt is due for labwork.  CBC/BMP requested at upcoming MD appt.

## 2022-10-16 ENCOUNTER — Other Ambulatory Visit: Payer: Self-pay

## 2022-10-16 ENCOUNTER — Telehealth: Payer: Self-pay | Admitting: Cardiology

## 2022-10-16 MED ORDER — APIXABAN 2.5 MG PO TABS: 2.5000 mg | ORAL_TABLET | Freq: Two times a day (BID) | ORAL | 1 refills | Status: DC

## 2022-10-16 NOTE — Telephone Encounter (Signed)
*  STAT* If patient is at the pharmacy, call can be transferred to refill team.   1. Which medications need to be refilled? (please list name of each medication and dose if known)   apixaban (ELIQUIS) 2.5 MG TABS tablet    2. Which pharmacy/location (including street and city if local pharmacy) is medication to be sent to?  [facility] - [place], [place] - [address]      3. Do they need a 30 day or 90 day supply? 90 day

## 2022-10-16 NOTE — Telephone Encounter (Signed)
Prescription refill request for Eliquis received. Indication:afib Last office visit:1/24 Scr:1.15  2023 Age: 82 Weight:57.9  kg  Prescription refilled

## 2022-10-22 MED ORDER — APIXABAN 2.5 MG PO TABS: 2.5000 mg | ORAL_TABLET | Freq: Two times a day (BID) | ORAL | 1 refills | Status: DC

## 2022-10-22 NOTE — Addendum Note (Signed)
Addended by: General Wearing, Elmarie Shiley L on: 10/22/2022 04:22 PM   Modules accepted: Orders

## 2022-11-09 ENCOUNTER — Other Ambulatory Visit: Payer: Self-pay

## 2022-12-13 ENCOUNTER — Encounter: Payer: Self-pay | Admitting: Cardiology

## 2022-12-13 ENCOUNTER — Ambulatory Visit: Payer: Medicare Other | Attending: Cardiology | Admitting: Cardiology

## 2022-12-13 VITALS — BP 90/60 | HR 61 | Ht 65.0 in | Wt 123.8 lb

## 2022-12-13 DIAGNOSIS — I10 Essential (primary) hypertension: Secondary | ICD-10-CM | POA: Diagnosis present

## 2022-12-13 DIAGNOSIS — I480 Paroxysmal atrial fibrillation: Secondary | ICD-10-CM

## 2022-12-13 DIAGNOSIS — F3340 Major depressive disorder, recurrent, in remission, unspecified: Secondary | ICD-10-CM

## 2022-12-13 MED ORDER — APIXABAN 2.5 MG PO TABS: 2.5000 mg | ORAL_TABLET | Freq: Two times a day (BID) | ORAL | 3 refills | Status: DC

## 2022-12-13 NOTE — Progress Notes (Signed)
Cardiology Office Note:    Date:  12/13/2022   ID:  Maria Navarro, DOB 02/20/1941, MRN [id number]  PCP:  Olive Bass, MD  Cardiologist:  Gypsy Balsam, MD    Referring MD: Olive Bass, MD   Chief Complaint  Patient presents with   Medication Refill    Eliquis    History of Present Illness:    Maria Navarro is a 82 y.o. female with past medical history significant for paroxysmal atrial fibrillation, mild aortic stenosis, essential hypertension, diffuse arthritis.  She is anticoagulated right now with 2.5 mg Eliquis twice daily doing well denies have any palpitations overall seems to be doing fine.  No chest pain tightness squeezing pressure burning chest  Past Medical History:  Diagnosis Date   Allergic rhinitis 08/10/2015   Anemia 11/15/2020   Formatting of this note might be different from the original. 10/2020: incidental HGB 11.1, MCV 96, RDW 14   Aortic valve sclerosis 05/14/2019   Formatting of this note might be different from the original. 06/02/2020: ECHO, mild   Atrial fibrillation (HCC) 10/26/2020   Formatting of this note might be different from the original. 10/26/2020: noted pre-op by anesthesia, rate 118, to ED   Benign hypertension 08/18/2015   Chronic bilateral low back pain without sciatica 05/30/2020   Family history of premature coronary artery disease 04/14/2018   GERD (gastroesophageal reflux disease) 08/10/2015   Lumbar disc disease 05/30/2020   Mixed hyperlipidemia 10/03/2015   2018: declined rx 2019: declinded rx   Osteoarthritis 08/10/2015   Osteoporosis 10/03/2015   2008: -1.5 2017: -2.5 hip  Formatting of this note might be different from the original. 2008: -1.5 2017: -2.5 hip 2021: -2.6   Pain syndrome, chronic 08/10/2015   (2004) DX/RX: NSAID (2006) DX: NSAID Gastric ulcer with esoph stricture  (2006) RX: tramadol   Plantar fasciitis 08/18/2016   Recurrent major depressive disorder, in remission (HCC) 08/10/2015   Screening for diabetes mellitus (DM)  10/03/2015    Past Surgical History:  Procedure Laterality Date   APPENDECTOMY     CARDIOVERSION  12/29/2020   CARDIOVERSION N/A 07/25/2021   Procedure: CARDIOVERSION;  Surgeon: Christell Constant, MD;  Location: [facility];  Service: Cardiovascular;  Laterality: N/A;   CHOLECYSTECTOMY     FOOT SURGERY     Hernia repaired      Current Medications: Current Meds  Medication Sig   acetaminophen (TYLENOL) 500 MG tablet Take 1,000 mg by mouth daily as needed for moderate pain or headache (Arthritis).   amiodarone (PACERONE) 200 MG tablet Take 200 mg by mouth daily.   apixaban (ELIQUIS) 2.5 MG TABS tablet Take 1 tablet (2.5 mg total) by mouth 2 (two) times daily.   Boswellia-Glucosamine-Vit D (OSTEO BI-FLEX ONE PER DAY PO) Take 1 tablet by mouth 2 (two) times daily.   buPROPion (WELLBUTRIN XL) 300 MG 24 hr tablet Take 300 mg by mouth daily.   Casanthranol-Docusate Sodium (LAXATIVE PLUS STOOL SOFTENER PO) Take 2 tablets by mouth at bedtime.   Cholecalciferol (VITAMIN D3) 50 MCG (2000 UT) TABS Take 2,000 Units by mouth every morning.   Cyanocobalamin (VITAMIN B12) 1000 MCG TBCR Take 1,000 mcg by mouth every morning.   DULoxetine (CYMBALTA) 30 MG capsule Take 30 mg by mouth 2 (two) times daily.   fexofenadine (ALLEGRA) 180 MG tablet Take 180 mg by mouth daily.   furosemide (LASIX) 20 MG tablet Take 1 tablet (20 mg total) by mouth every other day.   furosemide (LASIX)  20 MG tablet Take 20 mg by mouth daily as needed for fluid or edema.   iron polysaccharides (NIFEREX) 150 MG capsule Take 150 mg by mouth every other day.   metoprolol tartrate (LOPRESSOR) 25 MG tablet Take 1 tablet (25 mg total) by mouth 2 (two) times daily.   Multiple Vitamins-Minerals (VISION FORMULA PO) Take 1 tablet by mouth every morning. 50 +   Omega-3 Fatty Acids (OMEGA-3 FISH OIL PO) Take 1,000 mg by mouth every morning.   pantoprazole (PROTONIX) 40 MG tablet Take 40 mg by mouth 2 (two) times daily.   Probiotic  Product (PHILLIPS COLON HEALTH) CAPS Take 1 capsule by mouth 3 (three) times a week. No set days   sacubitril-valsartan (ENTRESTO) 24-26 MG Take 1 tablet by mouth 2 (two) times daily.   traMADol (ULTRAM) 50 MG tablet Take 50 mg by mouth every 6 (six) hours as needed for moderate pain or severe pain.   Turmeric (QC TUMERIC COMPLEX PO) Take 1 tablet by mouth 3 (three) times a week. No set days     Allergies:   Escitalopram   Social History   Socioeconomic History   Marital status: Widowed    Spouse name: Not on file   Number of children: 3   Years of education: Not on file   Highest education level: 11th grade  Occupational History   Occupation: Retired  Tobacco Use   Smoking status: Former    Current packs/day: 0.00    Types: Cigarettes    Quit date: 1988    Years since quitting: 36.6   Smokeless tobacco: Never  Vaping Use   Vaping status: Never Used  Substance and Sexual Activity   Alcohol use: Not Currently   Drug use: Never   Sexual activity: Not on file  Other Topics Concern   Not on file  Social History Narrative   Not on file   Social Determinants of Health   Financial Resource Strain: Low Risk  (07/27/2021)   Overall Financial Resource Strain (CARDIA)    Difficulty of Paying Living Expenses: Not hard at all  Food Insecurity: No Food Insecurity (07/27/2021)   Hunger Vital Sign    Worried About Running Out of Food in the Last Year: Never true    Ran Out of Food in the Last Year: Never true  Transportation Needs: No Transportation Needs (07/27/2021)   PRAPARE - Administrator, Civil Service (Medical): No    Lack of Transportation (Non-Medical): No  Physical Activity: Not on file  Stress: Not on file  Social Connections: Not on file     Family History: The patient's Family history is unknown by patient. ROS:   Please see the history of present illness.    All 14 point review of systems negative except as described per history of present  illness  EKGs/Labs/Other Studies Reviewed:         Recent Labs: No results found for requested labs within last 365 days.  Recent Lipid Panel No results found for: "CHOL", "TRIG", "HDL", "CHOLHDL", "VLDL", "LDLCALC", "LDLDIRECT"  Physical Exam:    VS:  BP 90/60 (BP Location: Left Arm, Patient Position: Sitting)   Pulse 61   Ht 5\' 5"  (1.651 m)   Wt 123 lb 12.8 oz (56.2 kg)   LMP  (LMP Unknown)   SpO2 96%   BMI 20.60 kg/m     Wt Readings from Last 3 Encounters:  12/13/22 123 lb 12.8 oz (56.2 kg)  05/29/22 127 lb 9.6  oz (57.9 kg)  11/28/21 122 lb (55.3 kg)     GEN:  Well nourished, well developed in no acute distress HEENT: Normal NECK: No JVD; No carotid bruits LYMPHATICS: No lymphadenopathy CARDIAC: RRR, soft systolic murmur grade 2 1-2/6 best heard right upper portion of sternum, no rubs, no gallops RESPIRATORY:  Clear to auscultation without rales, wheezing or rhonchi  ABDOMEN: Soft, non-tender, non-distended MUSCULOSKELETAL:  No edema; No deformity  SKIN: Warm and dry LOWER EXTREMITIES: no swelling NEUROLOGIC:  Alert and oriented x 3 PSYCHIATRIC:  Normal affect   ASSESSMENT:    1. Paroxysmal atrial fibrillation (HCC)   2. Benign hypertension   3. Recurrent major depressive disorder, in remission (HCC)    PLAN:    In order of problems listed above:  Paroxysmal atrial fibrillation seems to maintain sinus rhythm, anticoagulated denies having a palpitation.  Will continue present management. Essential hypertension blood pressure seems to be well-controlled we will continue present management. Depression seems to be stable from that point review. Aortic stenosis last assessment only mild.  Will repeat echocardiogram next year   Medication Adjustments/Labs and Tests Ordered: Current medicines are reviewed at length with the patient today.  Concerns regarding medicines are outlined above.  No orders of the defined types were placed in this  encounter.  Medication changes: No orders of the defined types were placed in this encounter.   Signed, Georgeanna Lea, MD, [facility] 12/13/2022 11:28 AM    [facility]

## 2022-12-13 NOTE — Patient Instructions (Signed)
Medication Instructions:   Start: Eliquis 2.5mg  1 twice daily   Lab Work: None Ordered If you have labs (blood work) drawn today and your tests are completely normal, you will receive your results only by: MyChart Message (if you have MyChart) OR A paper copy in the mail If you have any lab test that is abnormal or we need to change your treatment, we will call you to review the results.   Testing/Procedures: None Ordered   Follow-Up: At [facility], you and your health needs are our priority.  As part of our continuing mission to provide you with exceptional heart care, we have created designated Provider Care Teams.  These Care Teams include your primary Cardiologist (physician) and Advanced Practice Providers (APPs -  Physician Assistants and Nurse Practitioners) who all work together to provide you with the care you need, when you need it.  We recommend signing up for the patient portal called "MyChart".  Sign up information is provided on this After Visit Summary.  MyChart is used to connect with patients for Virtual Visits (Telemedicine).  Patients are able to view lab/test results, encounter notes, upcoming appointments, etc.  Non-urgent messages can be sent to your provider as well.   To learn more about what you can do with MyChart, go to [email or web address].    Your next appointment:   6 month(s)  The format for your next appointment:   In Person  Provider:   Gypsy Balsam, MD    Other Instructions NA

## 2022-12-13 NOTE — Addendum Note (Signed)
Addended by: Baldo Ash D on: 12/13/2022 11:46 AM   Modules accepted: Orders

## 2022-12-18 ENCOUNTER — Telehealth: Payer: Self-pay

## 2022-12-18 DIAGNOSIS — R931 Abnormal findings on diagnostic imaging of heart and coronary circulation: Secondary | ICD-10-CM | POA: Insufficient documentation

## 2022-12-18 NOTE — Telephone Encounter (Signed)
   Pre-operative Risk Assessment    Patient Name: Maria Navarro  DOB: April 10, 1941 MRN: [id number]      Request for Surgical Clearance    Procedure:   Cervical discectomy and interbody fusion  Date of Surgery:  Clearance TBD                                 Surgeon:  Dr. Garnette Gunner Surgeon's Group or Practice Name:  [facility] Phone number:  [phone] Fax number:  [phone]   Type of Clearance Requested:   - Pharmacy:  Hold Apixaban (Eliquis) please advise   Type of Anesthesia:  General    Additional requests/questions:    Merlene Laughter   12/18/2022, 2:34 PM

## 2022-12-18 NOTE — Telephone Encounter (Signed)
1st attempt to reach pt. Lvm letting pt know that she needs updated labs before clearance can be addressed

## 2022-12-18 NOTE — Telephone Encounter (Signed)
Pt states that she is scheduled for preop labs tomorrow. Pt states that she will have labs faxed to our office. I made pt aware of what labs we need to be drawn and told her to call us if she is not able to have them done tomorrow.

## 2022-12-18 NOTE — Telephone Encounter (Signed)
Preoperative team, patient has not had recent lab work done.  Please order CBC and BMP.  As soon as we know results from lab work we will be able to provide recommendations for patient's upcoming surgery.  Thank you for your help.  Thomasene Ripple. Erma Raiche NP-C     12/18/2022, 4:18 PM [facility] [address] Office [phone] Fax [phone]

## 2022-12-18 NOTE — Telephone Encounter (Signed)
Patient with diagnosis of afib on Eliquis for anticoagulation.    Procedure: cervical disectomy and interbody fusion Date of procedure: not scheduled   CHA2DS2-VASc Score = 6  This indicates a 9.7% annual risk of stroke. The patient's score is based upon: CHF History: 1 HTN History: 1 Diabetes History: 0 Stroke History: 0 Vascular Disease History: 1 Age Score: 2 Gender Score: 1    Patient has not had BMET/CBC in >1 year. She will require updated labs before clearance can be given.  **This guidance is not considered finalized until pre-operative APP has relayed final recommendations.**

## 2022-12-18 NOTE — Telephone Encounter (Signed)
Pt returning call

## 2022-12-19 DIAGNOSIS — Z01818 Encounter for other preprocedural examination: Secondary | ICD-10-CM | POA: Diagnosis not present

## 2022-12-19 NOTE — Telephone Encounter (Signed)
Gaston Islam., NP  Cv Div Preop Callback6 minutes ago (1:20 PM)    Call back please advise patient that CBC and BMET are required prior to clearance being granted for holding [facility].

## 2022-12-19 NOTE — Telephone Encounter (Signed)
Patient states that lab needs further information in regards to which labs are needed. Requesting call back.

## 2022-12-19 NOTE — Telephone Encounter (Signed)
Orders have been placed and faxed over to [facility].   I spoke with the patient and asked if she had a number for [facility]. The patient states she has the number somewhere but she doesn't know where.   After faxing the orders I contacted the patient and asked if she was able to find the number. The patient states she had labs drawn this morning but she does not know what they took she just knows that they took multiple tubes of blood.   I informed the patient that I had faxed over the orders and informed the patient that if the hospital did not receive them that she could go to the local [facility] corp and have the labs drawn there. She states she thinks [facility] hospital got her labs because they took a lot of blood form her.   [facility] and spoke with the lab tech and she states the patient did have labs this morning. She also states she received our lab orders; she states they drew a cbc and cmet an they will send a copy of the results once they have been completed.

## 2022-12-20 LAB — BASIC METABOLIC PANEL: EGFR: 43

## 2022-12-20 NOTE — Telephone Encounter (Signed)
Pharmacy please advise on holding Eliquis. Labs have been updated per request.  Thanks

## 2022-12-21 NOTE — Telephone Encounter (Signed)
   Patient Name: Maria Navarro  DOB: 02/21/1941 MRN: [id number]  Primary Cardiologist: Garwin Brothers, MD  Clinical pharmacists have reviewed the patient's past medical history, labs, and current medications as part of preoperative protocol coverage. The following recommendations have been made:  Pt can hold Eliquis for 3 days prior to procedure.   I will route this recommendation to the requesting party via Epic fax function and remove from pre-op pool.  Please call with questions.  Napoleon Form, Leodis Rains, NP 12/21/2022, 7:59 AM

## 2022-12-21 NOTE — Telephone Encounter (Signed)
Plt count 258K, SCr 1.2, CrCl 70mL/min.  Pt can hold Eliquis for 3 days prior to procedure.

## 2022-12-26 ENCOUNTER — Telehealth: Payer: Self-pay | Admitting: Cardiology

## 2022-12-26 NOTE — Telephone Encounter (Signed)
I will re-fax clearance notes

## 2022-12-26 NOTE — Telephone Encounter (Signed)
  Jasmine December from [facility] calling requesting to refax clearance to [phone]

## 2023-01-15 IMAGING — CT CT ABD-PELV W/ CM
2 of 5 series · 16 of 46 positions shown, 18 images · IV contrast (APPLIED)
Comparison: 08/29/2018.

CLINICAL DATA: Abdominal pain, acute, nonlocalized. Weakness,
shortness of breath, and palpitations.

EXAM:
CT ABDOMEN AND PELVIS WITH CONTRAST
TECHNIQUE: Multidetector CT imaging of the abdomen and pelvis was performed
using the standard protocol following bolus administration of
intravenous contrast.

[Series 3: abdomen 5.0 · axial · 0.80mm/px · z∈[+483,+868]mm · 13 of 89 slices shown, 15 images]
[im 6/89  soft-tissue]
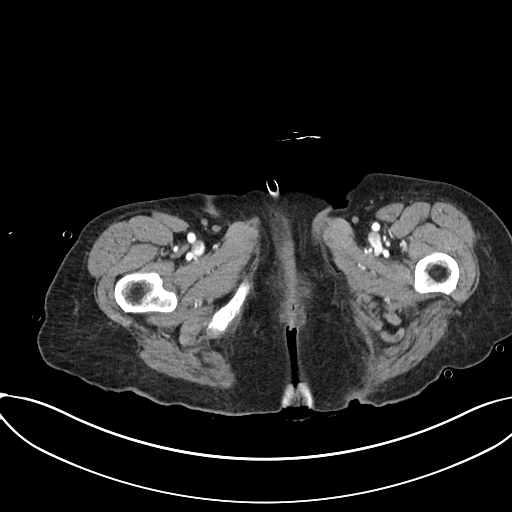
[im 6/89  bone]
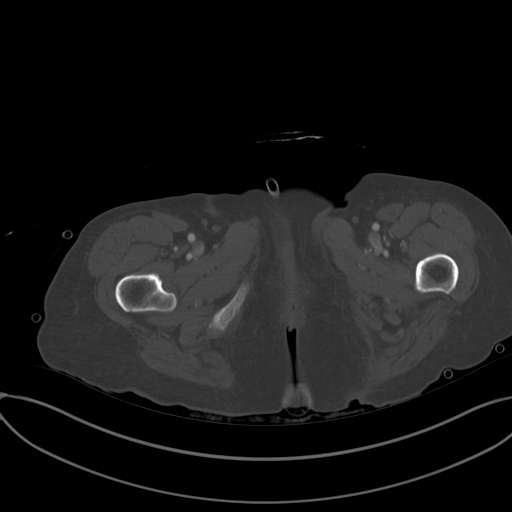
[im 12/89  soft-tissue]
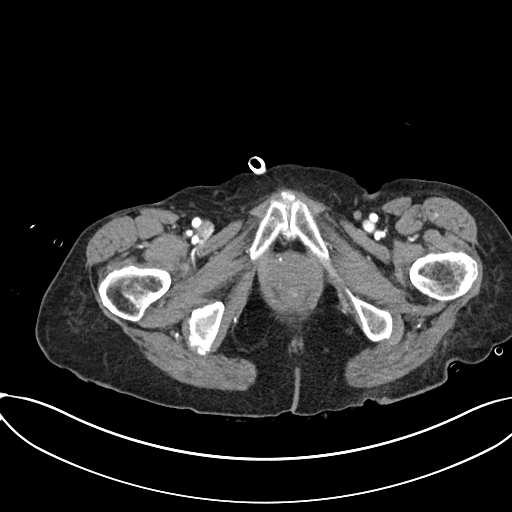
[im 18/89  soft-tissue]
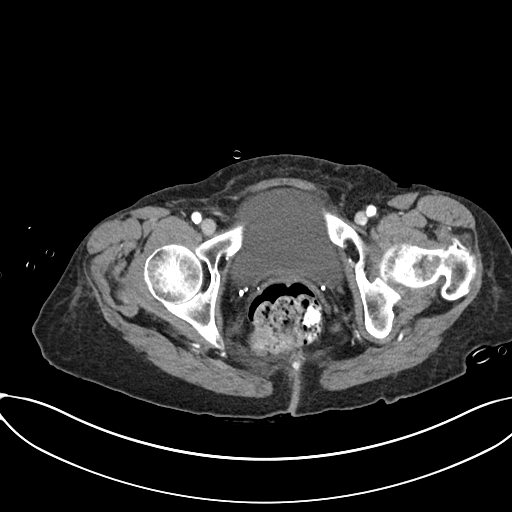
[im 24/89  soft-tissue]
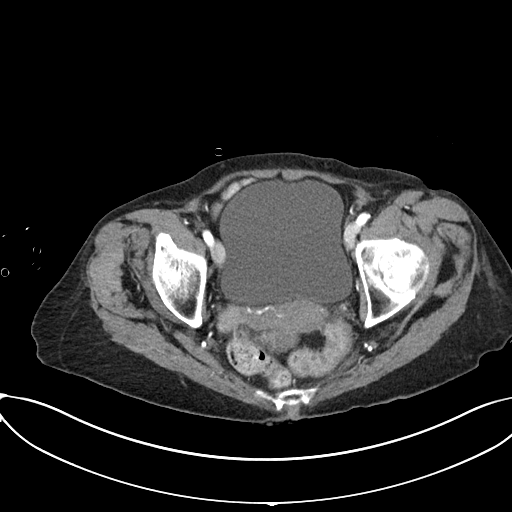
[im 30/89  soft-tissue]
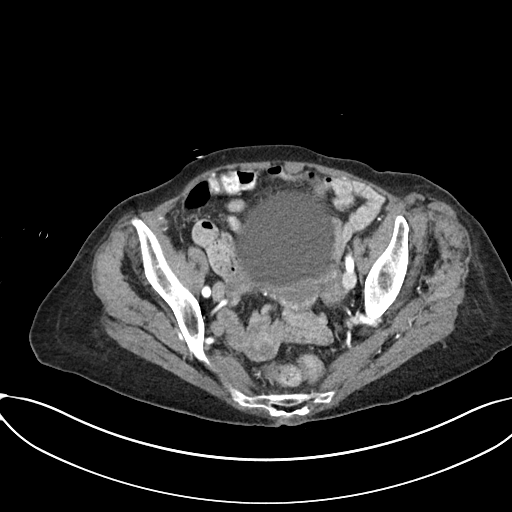
[im 36/89  soft-tissue]
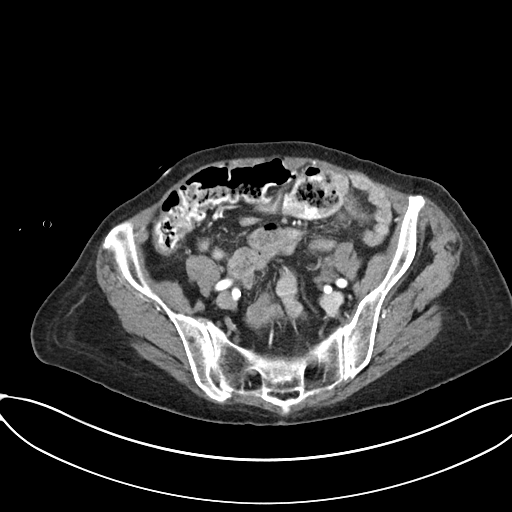
[im 47/89  soft-tissue]
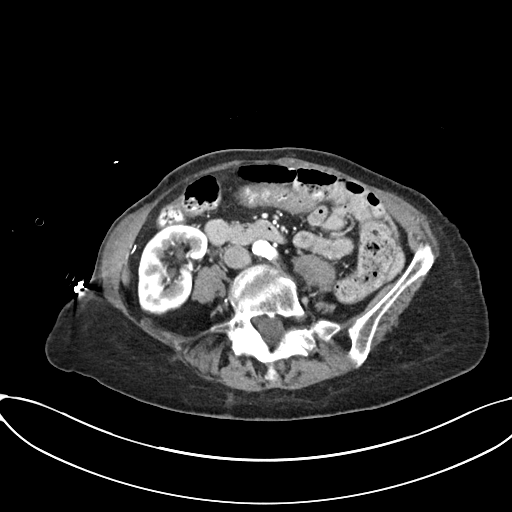
[im 53/89  soft-tissue]
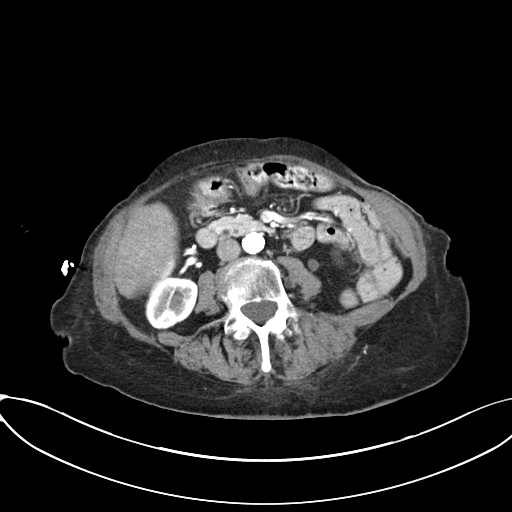
[im 59/89  soft-tissue]
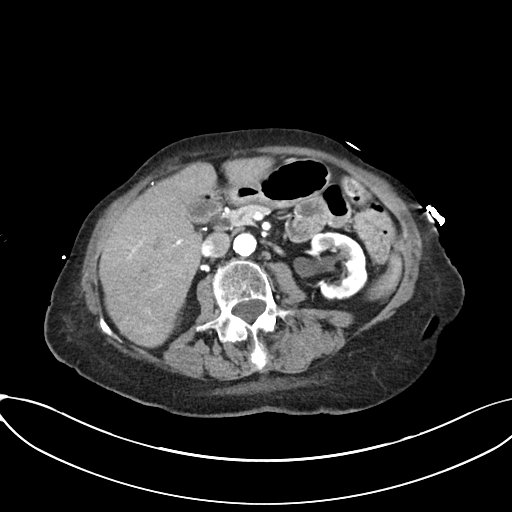
[im 59/89  bone]
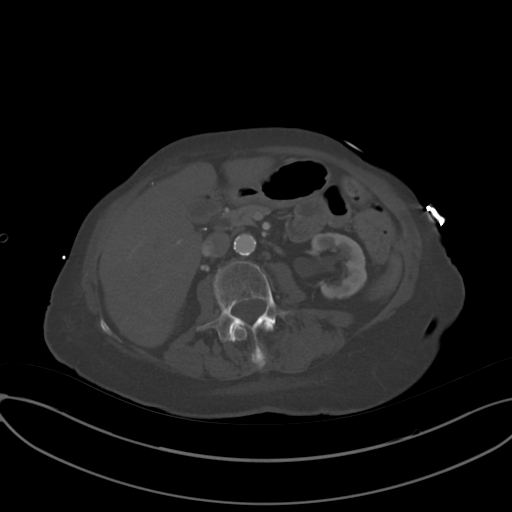
[im 65/89  soft-tissue]
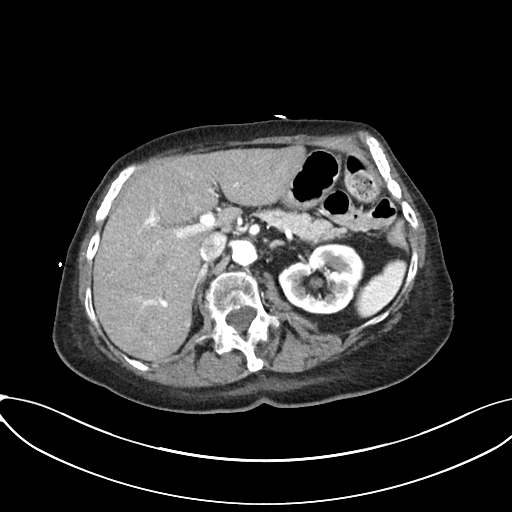
[im 71/89  soft-tissue]
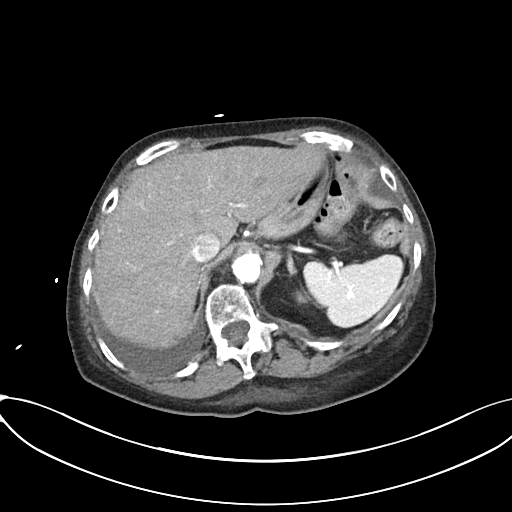
[im 77/89  soft-tissue]
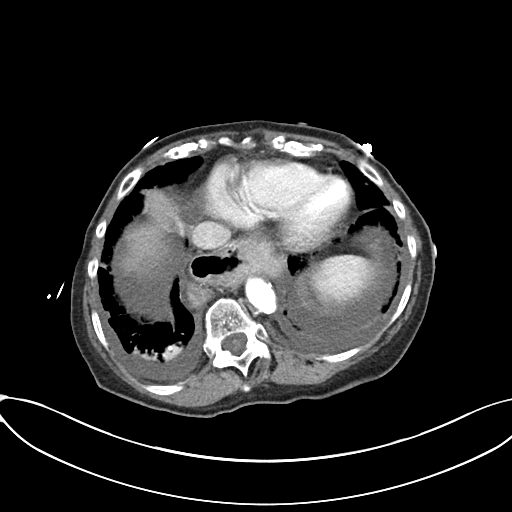
[im 83/89  soft-tissue]
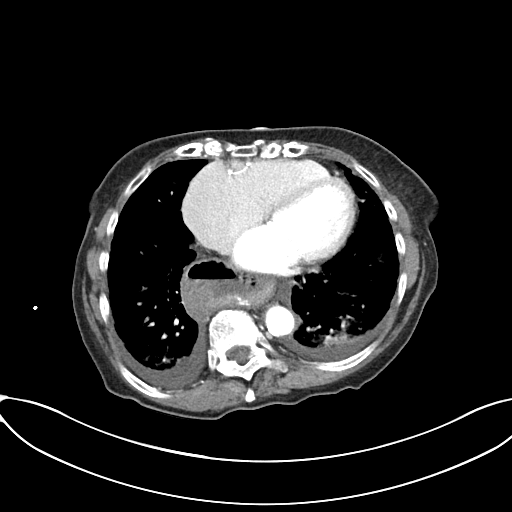

[Series 7: abdomen 3.0 mpr cor · coronal · 0.81mm/px · 3 of 83 slices shown]
[im 28/83  soft-tissue]
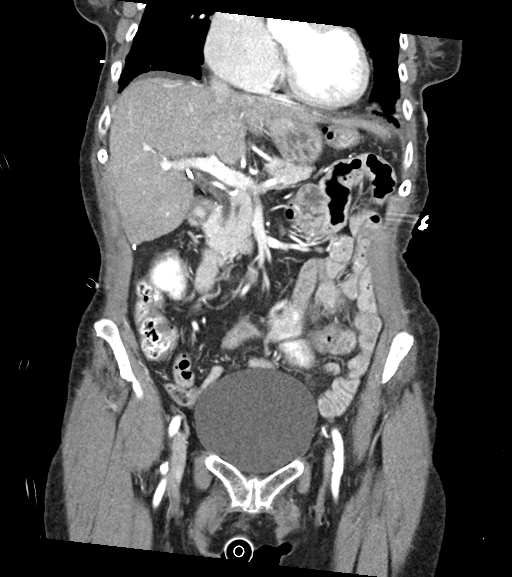
[im 37/83  soft-tissue]
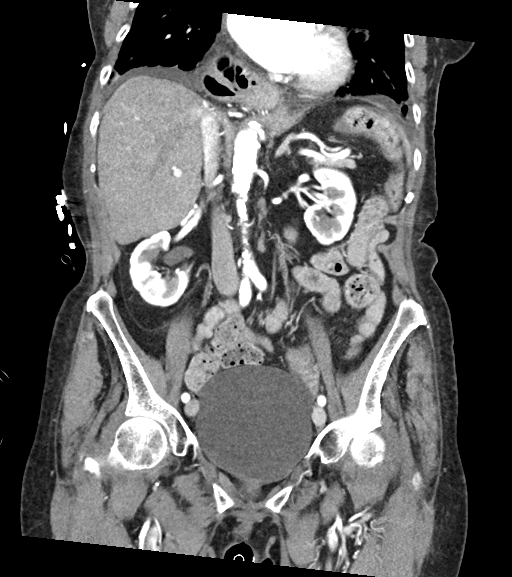
[im 46/83  soft-tissue]
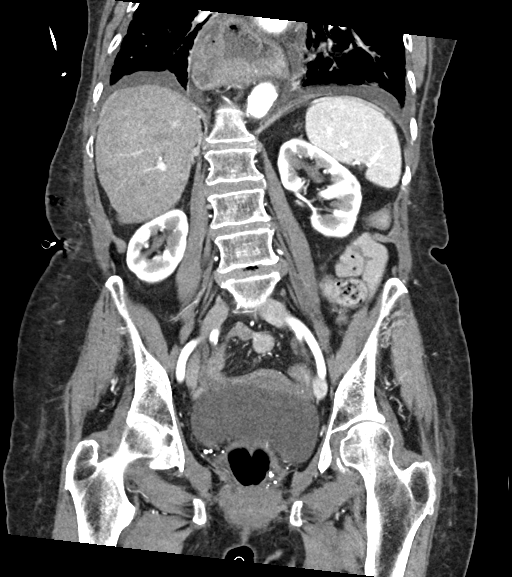

[16 of 46 positions shown; findings below may reference images not displayed]

RADIATION DOSE REDUCTION: This exam was performed according to the
departmental dose-optimization program which includes automated
exposure control, adjustment of the mA and/or kV according to
patient size and/or use of iterative reconstruction technique.

CONTRAST:  100mL OMNIPAQUE IOHEXOL 300 MG/ML  SOLN
FINDINGS: Lower chest: The heart is enlarged and coronary artery
calcifications are noted. There is a large hiatal hernia. Small
bilateral pleural effusions are noted with patchy ground-glass
opacities at the lung bases. Interlobular septal thickening is noted
at the lung bases.

Hepatobiliary: No focal liver abnormality is seen. The common bile
duct is prominent in size which is likely related to patient's post
cholecystectomy status.

Pancreas: Unremarkable. No pancreatic ductal dilatation or
surrounding inflammatory changes.

Spleen: Normal in size without focal abnormality.

Adrenals/Urinary Tract: No adrenal nodule or mass. There is no renal
calculus bilaterally. There is mild hydroureteronephrosis on the
left which is unchanged from the prior exam. No obstructing lesion
is identified. The bladder is unremarkable.

Stomach/Bowel: There is a large hiatal hernia. No bowel obstruction,
free air, or pneumatosis. A moderate amount of stool is present in
the colon. The cecum crosses the midline and is located in the left
lower quadrant. The appendix is not visualized on exam.

Vascular/Lymphatic: Aortic atherosclerosis. No enlarged abdominal or
pelvic lymph nodes.

Reproductive: Uterus and bilateral adnexa are unremarkable.

Other: No ascites.

Musculoskeletal: Degenerative changes are present in the
thoracolumbar spine. No acute osseous abnormality.
IMPRESSION: 1. Patchy ground-glass opacities at the lung bases, possible edema
versus pneumonitis.
2. Small bilateral pleural effusions.
3. Large hiatal hernia.
4. Cardiomegaly with coronary artery calcifications.
5. The cecum is identified in the left lower quadrant, possible
mobile cecum. The appendix is not visualized on exam.
6. Aortic atherosclerosis.
7. Mild hydroureteronephrosis with no obstructing calculus.

## 2023-01-15 IMAGING — DX DG CHEST 1V PORT
1 series · 1 of 1 positions shown · non-contrast
Comparison: 10/26/2020

CLINICAL DATA: Shortness of breath

EXAM:
PORTABLE CHEST 1 VIEW

[chest ap]
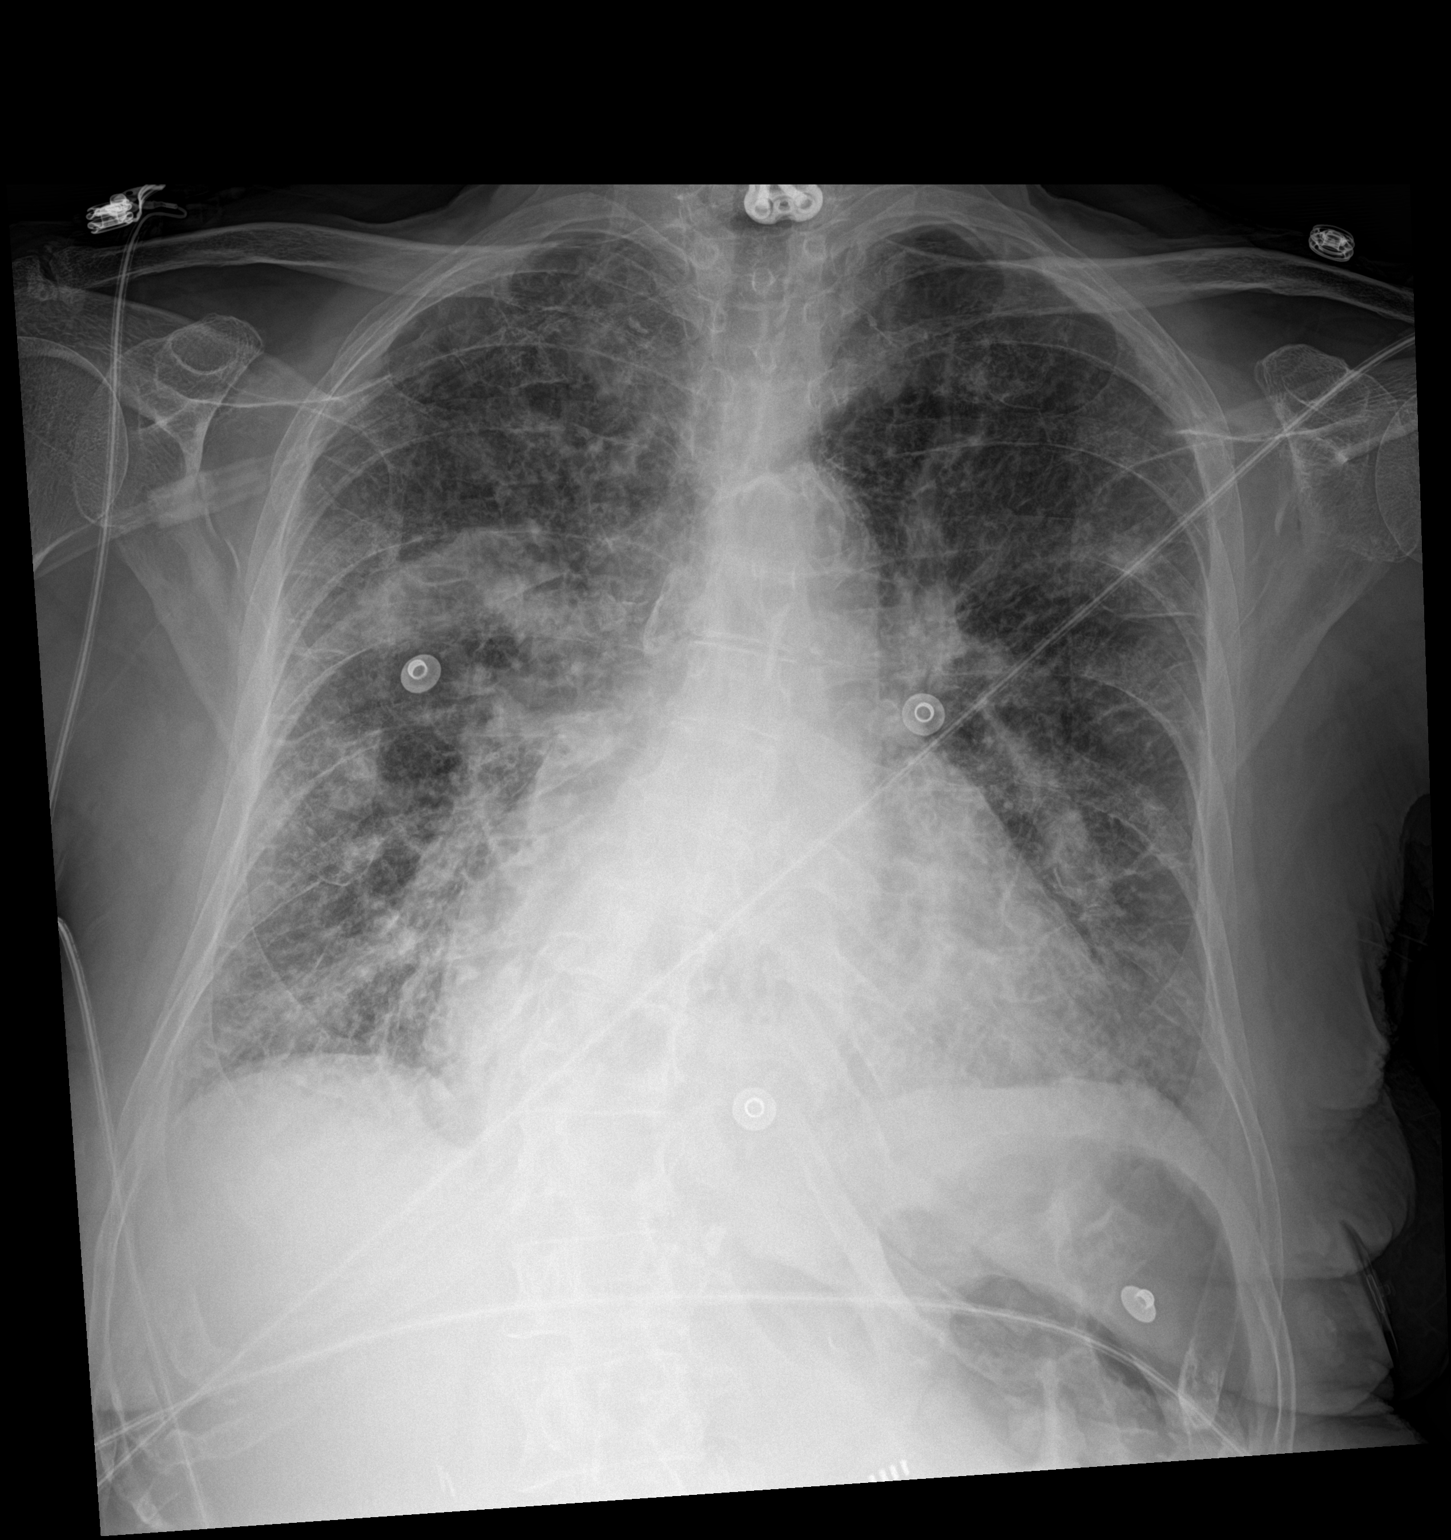

[1 of 1 positions shown; findings below may reference images not displayed]

FINDINGS: Cardiac shadow is enlarged but stable. Aortic calcifications are
again seen. Persistent and increased density is noted in the right
upper lobe. Nodular opacity is noted in the left upper lobe as well.
Diffuse interstitial edema and vascular congestion is noted. No bony
abnormality is seen. Postsurgical changes in the cervical spine are
noted.
IMPRESSION: Changes consistent with CHF worse than that seen on the prior exam.

There is increased right upper lobe density identified when compared
with the prior exam suspicious for an enlarging lesion. Small
nodular density on the left is noted as well. CT of the chest with
contrast would be helpful for further evaluation.

## 2023-01-25 ENCOUNTER — Ambulatory Visit: Payer: Medicare Other | Admitting: Cardiology

## 2023-02-25 ENCOUNTER — Other Ambulatory Visit: Payer: Self-pay | Admitting: Cardiology

## 2023-03-11 ENCOUNTER — Other Ambulatory Visit: Payer: Self-pay | Admitting: Cardiology

## 2023-03-16 ENCOUNTER — Other Ambulatory Visit: Payer: Self-pay | Admitting: Cardiology

## 2023-04-10 ENCOUNTER — Other Ambulatory Visit: Payer: Self-pay | Admitting: Cardiology

## 2023-05-08 ENCOUNTER — Other Ambulatory Visit: Payer: Self-pay | Admitting: Cardiology

## 2023-05-09 NOTE — Telephone Encounter (Signed)
 Prescription sent to pharmacy.

## 2023-06-11 DIAGNOSIS — R413 Other amnesia: Secondary | ICD-10-CM | POA: Insufficient documentation

## 2023-07-18 ENCOUNTER — Ambulatory Visit: Payer: Medicare Other | Attending: Cardiology | Admitting: Cardiology

## 2023-07-18 ENCOUNTER — Encounter: Payer: Self-pay | Admitting: Cardiology

## 2023-07-18 VITALS — BP 116/72 | HR 70 | Ht 65.0 in | Wt 127.8 lb

## 2023-07-18 DIAGNOSIS — E782 Mixed hyperlipidemia: Secondary | ICD-10-CM | POA: Insufficient documentation

## 2023-07-18 DIAGNOSIS — R0609 Other forms of dyspnea: Secondary | ICD-10-CM | POA: Insufficient documentation

## 2023-07-18 DIAGNOSIS — I10 Essential (primary) hypertension: Secondary | ICD-10-CM | POA: Diagnosis present

## 2023-07-18 DIAGNOSIS — I358 Other nonrheumatic aortic valve disorders: Secondary | ICD-10-CM | POA: Diagnosis present

## 2023-07-18 DIAGNOSIS — I480 Paroxysmal atrial fibrillation: Secondary | ICD-10-CM | POA: Insufficient documentation

## 2023-07-18 DIAGNOSIS — R931 Abnormal findings on diagnostic imaging of heart and coronary circulation: Secondary | ICD-10-CM | POA: Diagnosis present

## 2023-07-18 NOTE — Progress Notes (Signed)
 Cardiology Office Note:    Date:  07/18/2023   ID:  Maria Navarro, DOB [birth year], MRN [id number]  PCP:  Olive Bass, MD  Cardiologist:  Gypsy Balsam, MD    Referring MD: Olive Bass, MD   Chief Complaint  Patient presents with   Follow-up    History of Present Illness:    Maria Navarro is a 83 y.o. female past medical history significant for paroxysmal atrial fibrillation suppressed with amiodarone, mild aortic stenosis, essential hypertension, diffuse arthritis.  Comes today to my office for follow-up overall doing well.  Denies have any palpitations no chest pain tightness squeezing pressure burning chest overall doing well.  Past Medical History:  Diagnosis Date   Allergic rhinitis 08/10/2015   Anemia 11/15/2020   Formatting of this note might be different from the original. 10/2020: incidental HGB 11.1, MCV 96, RDW 14   Aortic valve sclerosis 05/14/2019   Formatting of this note might be different from the original. 06/02/2020: ECHO, mild   Atrial fibrillation (HCC) 10/26/2020   Formatting of this note might be different from the original. 10/26/2020: noted pre-op by anesthesia, rate 118, to ED   Benign hypertension 08/18/2015   Chronic bilateral low back pain without sciatica 05/30/2020   Family history of premature coronary artery disease 04/14/2018   GERD (gastroesophageal reflux disease) 08/10/2015   Lumbar disc disease 05/30/2020   Mixed hyperlipidemia 10/03/2015   2018: declined rx 2019: declinded rx   Osteoarthritis 08/10/2015   Osteoporosis 10/03/2015   2008: -1.5 2017: -2.5 hip  Formatting of this note might be different from the original. 2008: -1.5 2017: -2.5 hip 2021: -2.6   Pain syndrome, chronic 08/10/2015   (2004) DX/RX: NSAID (2006) DX: NSAID Gastric ulcer with esoph stricture  (2006) RX: tramadol   Plantar fasciitis 08/18/2016   Recurrent major depressive disorder, in remission (HCC) 08/10/2015   Screening for diabetes mellitus (DM) 10/03/2015    Past Surgical  History:  Procedure Laterality Date   APPENDECTOMY     CARDIOVERSION  12/29/2020   CARDIOVERSION N/A 07/25/2021   Procedure: CARDIOVERSION;  Surgeon: Christell Constant, MD;  Location: [facility];  Service: Cardiovascular;  Laterality: N/A;   CHOLECYSTECTOMY     FOOT SURGERY     Hernia repaired      Current Medications: Current Meds  Medication Sig   acetaminophen (TYLENOL) 500 MG tablet Take 1,000 mg by mouth daily as needed for moderate pain or headache (Arthritis).   amiodarone (PACERONE) 200 MG tablet Take 200 mg by mouth daily.   apixaban (ELIQUIS) 2.5 MG TABS tablet Take 1 tablet (2.5 mg total) by mouth 2 (two) times daily.   Boswellia-Glucosamine-Vit D (OSTEO BI-FLEX ONE PER DAY PO) Take 1 tablet by mouth 2 (two) times daily.   buPROPion (WELLBUTRIN XL) 300 MG 24 hr tablet Take 300 mg by mouth daily.   Casanthranol-Docusate Sodium (LAXATIVE PLUS STOOL SOFTENER PO) Take 2 tablets by mouth at bedtime.   Cholecalciferol (VITAMIN D3) 50 MCG (2000 UT) TABS Take 2,000 Units by mouth every morning.   Cyanocobalamin (VITAMIN B12) 1000 MCG TBCR Take 1,000 mcg by mouth every morning.   DULoxetine (CYMBALTA) 30 MG capsule Take 30 mg by mouth 2 (two) times daily.   ENTRESTO 24-26 MG TAKE 1 TABLET TWICE A DAY   fexofenadine (ALLEGRA) 180 MG tablet Take 180 mg by mouth daily.   furosemide (LASIX) 20 MG tablet Take 20 mg by mouth daily as needed for fluid or edema.  furosemide (LASIX) 20 MG tablet Take 1 tablet (20 mg total) by mouth every other day.   HYDROcodone-acetaminophen (NORCO/VICODIN) 5-325 MG tablet Take 1 tablet by mouth 2 (two) times daily.   iron polysaccharides (NIFEREX) 150 MG capsule Take 150 mg by mouth every other day.   metoprolol tartrate (LOPRESSOR) 25 MG tablet TAKE 1 TABLET TWICE A DAY   Multiple Vitamins-Minerals (VISION FORMULA PO) Take 1 tablet by mouth every morning. 50 +   Omega-3 Fatty Acids (OMEGA-3 FISH OIL PO) Take 1,000 mg by mouth every morning.    pantoprazole (PROTONIX) 40 MG tablet Take 40 mg by mouth 2 (two) times daily.   Probiotic Product (PHILLIPS COLON HEALTH) CAPS Take 1 capsule by mouth 3 (three) times a week. No set days   Turmeric (QC TUMERIC COMPLEX PO) Take 1 tablet by mouth 3 (three) times a week. No set days   [DISCONTINUED] traMADol (ULTRAM) 50 MG tablet Take 50 mg by mouth every 6 (six) hours as needed for moderate pain or severe pain.     Allergies:   Escitalopram   Social History   Socioeconomic History   Marital status: Widowed    Spouse name: Not on file   Number of children: 3   Years of education: Not on file   Highest education level: 11th grade  Occupational History   Occupation: Retired  Tobacco Use   Smoking status: Former    Current packs/day: 0.00    Types: Cigarettes    Quit date: 1988    Years since quitting: 37.2   Smokeless tobacco: Never  Vaping Use   Vaping status: Never Used  Substance and Sexual Activity   Alcohol use: Not Currently   Drug use: Never   Sexual activity: Not on file  Other Topics Concern   Not on file  Social History Narrative   Not on file   Social Drivers of Health   Financial Resource Strain: Low Risk  (07/27/2021)   Overall Financial Resource Strain (CARDIA)    Difficulty of Paying Living Expenses: Not hard at all  Food Insecurity: No Food Insecurity (07/27/2021)   Hunger Vital Sign    Worried About Running Out of Food in the Last Year: Never true    Ran Out of Food in the Last Year: Never true  Transportation Needs: No Transportation Needs (07/27/2021)   PRAPARE - Administrator, Civil Service (Medical): No    Lack of Transportation (Non-Medical): No  Physical Activity: Not on file  Stress: Not on file  Social Connections: Not on file     Family History: The patient's Family history is unknown by patient. ROS:   Please see the history of present illness.    All 14 point review of systems negative except as described per history of present  illness  EKGs/Labs/Other Studies Reviewed:         Recent Labs: No results found for requested labs within last 365 days.  Recent Lipid Panel No results found for: "CHOL", "TRIG", "HDL", "CHOLHDL", "VLDL", "LDLCALC", "LDLDIRECT"  Physical Exam:    VS:  BP 116/72 (BP Location: Right Arm, Patient Position: Sitting)   Pulse 70   Ht 5\' 5"  (1.651 m)   Wt 127 lb 12.8 oz (58 kg)   LMP  (LMP Unknown)   SpO2 97%   BMI 21.27 kg/m     Wt Readings from Last 3 Encounters:  07/18/23 127 lb 12.8 oz (58 kg)  12/13/22 123 lb 12.8 oz (56.2 kg)  05/29/22 127 lb 9.6 oz (57.9 kg)     GEN:  Well nourished, well developed in no acute distress HEENT: Normal NECK: No JVD; No carotid bruits LYMPHATICS: No lymphadenopathy CARDIAC: RRR, no murmurs, no rubs, no gallops RESPIRATORY:  Clear to auscultation without rales, wheezing or rhonchi  ABDOMEN: Soft, non-tender, non-distended MUSCULOSKELETAL:  No edema; No deformity  SKIN: Warm and dry LOWER EXTREMITIES: no swelling NEUROLOGIC:  Alert and oriented x 3 PSYCHIATRIC:  Normal affect   ASSESSMENT:    1. Paroxysmal atrial fibrillation (HCC)   2. Benign hypertension   3. Dyspnea on exertion   4. Elevated coronary artery calcium score   5. Aortic valve sclerosis   6. Mixed hyperlipidemia    PLAN:    In order of problems listed above:  Paroxysmal atrial fibrillation maintained sinus rhythm continue rhythm control strategy continue anticoagulation. Benign essential hypertension blood pressure well-controlled continue present management. Elevated calcium score but no signs and symptoms of coronary artery disease activation continue monitoring. Mixed dyslipidemia follow-up by internal medicine team   Medication Adjustments/Labs and Tests Ordered: Current medicines are reviewed at length with the patient today.  Concerns regarding medicines are outlined above.  Orders Placed This Encounter  Procedures   EKG 12-Lead   ECHOCARDIOGRAM  COMPLETE   Medication changes: No orders of the defined types were placed in this encounter.   Signed, Georgeanna Lea, MD, [facility] 07/18/2023 11:55 AM    [facility]

## 2023-07-18 NOTE — Patient Instructions (Signed)

## 2023-08-07 ENCOUNTER — Ambulatory Visit

## 2023-08-30 ENCOUNTER — Ambulatory Visit: Attending: Cardiology

## 2023-08-30 DIAGNOSIS — R0609 Other forms of dyspnea: Secondary | ICD-10-CM | POA: Diagnosis present

## 2023-08-30 LAB — ECHOCARDIOGRAM COMPLETE
AR max vel: 1.08 cm2
AV Area VTI: 1.05 cm2
AV Area mean vel: 1.03 cm2
AV Mean grad: 9.6 mmHg
AV Peak grad: 16.9 mmHg
Ao pk vel: 2.05 m/s
Area-P 1/2: 3.2 cm2
MV M vel: 3.51 m/s
MV Peak grad: 49.3 mmHg
S' Lateral: 2.6 cm

## 2023-09-05 ENCOUNTER — Telehealth: Payer: Self-pay

## 2023-09-05 NOTE — Telephone Encounter (Signed)
 Echo Results reviewed with pt as per Dr. Vanetta Shawl note.  Pt verbalized understanding and had no additional questions. Routed to PCP

## 2024-01-03 DIAGNOSIS — M47817 Spondylosis without myelopathy or radiculopathy, lumbosacral region: Secondary | ICD-10-CM | POA: Insufficient documentation

## 2024-01-03 DIAGNOSIS — M461 Sacroiliitis, not elsewhere classified: Secondary | ICD-10-CM | POA: Insufficient documentation

## 2024-02-24 ENCOUNTER — Encounter: Payer: Self-pay | Admitting: *Deleted

## 2024-02-24 ENCOUNTER — Encounter: Payer: Self-pay | Admitting: Cardiology

## 2024-02-24 ENCOUNTER — Ambulatory Visit: Attending: Cardiology | Admitting: Cardiology

## 2024-02-24 VITALS — BP 140/76 | HR 66 | Ht 65.0 in | Wt 117.0 lb

## 2024-02-24 DIAGNOSIS — Z79899 Other long term (current) drug therapy: Secondary | ICD-10-CM

## 2024-02-24 DIAGNOSIS — I480 Paroxysmal atrial fibrillation: Secondary | ICD-10-CM

## 2024-02-24 DIAGNOSIS — R931 Abnormal findings on diagnostic imaging of heart and coronary circulation: Secondary | ICD-10-CM | POA: Diagnosis not present

## 2024-02-24 DIAGNOSIS — E782 Mixed hyperlipidemia: Secondary | ICD-10-CM | POA: Diagnosis not present

## 2024-02-24 DIAGNOSIS — I10 Essential (primary) hypertension: Secondary | ICD-10-CM

## 2024-02-24 NOTE — Progress Notes (Signed)
 Cardiology Office Note:    Date:  02/24/2024   ID:  Maria Navarro, DOB 1940/10/14, MRN [id number]  PCP:  Maria Lamar CROME, MD  Cardiologist:  Lamar Fitch, MD    Referring MD: Maria Lamar CROME, MD   Chief Complaint  Patient presents with   Follow-up  Doing fine  History of Present Illness:    Maria Navarro is a 83 y.o. female past medical history significant for paroxysmal atrial fibrillation, suppressed with amiodarone , mild aortic stenosis, essential hypertension, diffuse arthritis, comes today to months for follow-up, overall doing great asymptomatic still complain of having multiple joint aches but no cardiac complaints no syncope no passing out no dizziness no swelling of lower extremities no chest pain no shortness of breath.  Past Medical History:  Diagnosis Date   Allergic rhinitis 08/10/2015   Anemia 11/15/2020   Formatting of this note might be different from the original. 10/2020: incidental HGB 11.1, MCV 96, RDW 14   Aortic valve sclerosis 05/14/2019   Formatting of this note might be different from the original. 06/02/2020: ECHO, mild   Atrial fibrillation (HCC) 10/26/2020   Formatting of this note might be different from the original. 10/26/2020: noted pre-op by anesthesia, rate 118, to ED   Benign hypertension 08/18/2015   Chronic bilateral low back pain without sciatica 05/30/2020   Family history of premature coronary artery disease 04/14/2018   GERD (gastroesophageal reflux disease) 08/10/2015   Lumbar disc disease 05/30/2020   Mixed hyperlipidemia 10/03/2015   2018: declined rx 2019: declinded rx   Osteoarthritis 08/10/2015   Osteoporosis 10/03/2015   2008: -1.5 2017: -2.5 hip  Formatting of this note might be different from the original. 2008: -1.5 2017: -2.5 hip 2021: -2.6   Pain syndrome, chronic 08/10/2015   (2004) DX/RX: NSAID (2006) DX: NSAID Gastric ulcer with esoph stricture  (2006) RX: tramadol   Plantar fasciitis 08/18/2016   Recurrent major depressive disorder,  in remission 08/10/2015   Screening for diabetes mellitus (DM) 10/03/2015    Past Surgical History:  Procedure Laterality Date   APPENDECTOMY     CARDIOVERSION  12/29/2020   CARDIOVERSION N/A 07/25/2021   Procedure: CARDIOVERSION;  Surgeon: Maria Stanly DELENA, MD;  Location: [facility];  Service: Cardiovascular;  Laterality: N/A;   CHOLECYSTECTOMY     FOOT SURGERY     Hernia repaired      Current Medications: Current Meds  Medication Sig   acetaminophen  (TYLENOL ) 500 MG tablet Take 1,000 mg by mouth daily as needed for moderate pain or headache (Arthritis).   amiodarone  (PACERONE ) 200 MG tablet Take 200 mg by mouth daily.   apixaban  (ELIQUIS ) 2.5 MG TABS tablet Take 1 tablet (2.5 mg total) by mouth 2 (two) times daily.   Boswellia-Glucosamine-Vit D (OSTEO BI-FLEX ONE PER DAY PO) Take 1 tablet by mouth 2 (two) times daily.   buPROPion  (WELLBUTRIN  XL) 300 MG 24 hr tablet Take 300 mg by mouth daily.   Casanthranol-Docusate Sodium (LAXATIVE PLUS STOOL SOFTENER PO) Take 2 tablets by mouth at bedtime.   Cholecalciferol (VITAMIN D3) 50 MCG (2000 UT) TABS Take 2,000 Units by mouth every morning.   Cyanocobalamin (VITAMIN B12) 1000 MCG TBCR Take 1,000 mcg by mouth every morning.   DULoxetine  (CYMBALTA ) 30 MG capsule Take 30 mg by mouth 2 (two) times daily.   ENTRESTO  24-26 MG TAKE 1 TABLET TWICE A DAY   fexofenadine (ALLEGRA) 180 MG tablet Take 180 mg by mouth daily.   furosemide  (LASIX ) 20 MG tablet  Take 1 tablet (20 mg total) by mouth every other day.   HYDROcodone -acetaminophen  (NORCO/VICODIN) 5-325 MG tablet Take 1 tablet by mouth 2 (two) times daily. (Patient taking differently: Take 1 tablet by mouth 3 (three) times daily.)   iron polysaccharides (NIFEREX) 150 MG capsule Take 150 mg by mouth every other day.   metoprolol  tartrate (LOPRESSOR ) 25 MG tablet TAKE 1 TABLET TWICE A DAY   Multiple Vitamins-Minerals (VISION FORMULA PO) Take 1 tablet by mouth every morning. 50 +   Omega-3  Fatty Acids (OMEGA-3 FISH OIL PO) Take 1,000 mg by mouth every morning.   pantoprazole  (PROTONIX ) 40 MG tablet Take 40 mg by mouth 2 (two) times daily.   Probiotic Product (PHILLIPS COLON HEALTH) CAPS Take 1 capsule by mouth 3 (three) times a week. No set days   Turmeric (QC TUMERIC COMPLEX PO) Take 1 tablet by mouth 3 (three) times a week. No set days     Allergies:   Escitalopram   Social History   Socioeconomic History   Marital status: Widowed    Spouse name: Not on file   Number of children: 3   Years of education: Not on file   Highest education level: 11th grade  Occupational History   Occupation: Retired  Tobacco Use   Smoking status: Former    Current packs/day: 0.00    Types: Cigarettes    Quit date: 1988    Years since quitting: 37.8   Smokeless tobacco: Never  Vaping Use   Vaping status: Never Used  Substance and Sexual Activity   Alcohol use: Not Currently   Drug use: Never   Sexual activity: Not on file  Other Topics Concern   Not on file  Social History Narrative   Not on file   Social Drivers of Health   Financial Resource Strain: Low Risk  (07/27/2021)   Overall Financial Resource Strain (CARDIA)    Difficulty of Paying Living Expenses: Not hard at all  Food Insecurity: Low Risk  (01/03/2024)   Received from [facility]   Hunger Vital Sign    Within the past 12 months, you worried that your food would run out before you got money to buy more: Never true    Within the past 12 months, the food you bought just didn't last and you didn't have money to get more. : Never true  Transportation Needs: No Transportation Needs (01/03/2024)   Received from [facility]    In the past 12 months, has lack of reliable transportation kept you from medical appointments, meetings, work or from getting things needed for daily living? : No  Physical Activity: Not on file  Stress: Not on file  Social Connections: Not on file     Family History: The  patient's Family history is unknown by patient. ROS:   Please see the history of present illness.    All 14 point review of systems negative except as described per history of present illness  EKGs/Labs/Other Studies Reviewed:    EKG Interpretation Date/Time:  Monday February 24 2024 15:29:31 EDT Ventricular Rate:  66 PR Interval:  142 QRS Duration:  134 QT Interval:  470 QTC Calculation: 492 R Axis:   82  Text Interpretation: Normal sinus rhythm Right bundle branch block When compared with ECG of 18-Jul-2023 11:05, QT has lengthened Confirmed by Bernie Charleston [phone]) on 02/24/2024 3:30:59 PM    Recent Labs: No results found for requested labs within last 365 days.  Recent Lipid  Panel No results found for: CHOL, TRIG, HDL, CHOLHDL, VLDL, LDLCALC, LDLDIRECT  Physical Exam:    VS:  BP (!) 140/76   Pulse 66   Ht 5' 5 (1.651 m)   Wt 117 lb (53.1 kg)   LMP  (LMP Unknown)   SpO2 95%   BMI 19.47 kg/m     Wt Readings from Last 3 Encounters:  02/24/24 117 lb (53.1 kg)  07/18/23 127 lb 12.8 oz (58 kg)  12/13/22 123 lb 12.8 oz (56.2 kg)     GEN:  Well nourished, well developed in no acute distress HEENT: Normal NECK: No JVD; No carotid bruits LYMPHATICS: No lymphadenopathy CARDIAC: RRR, systolic murmur 1-2/6 best heard right upper portion of sternum, no rubs, no gallops RESPIRATORY:  Clear to auscultation without rales, wheezing or rhonchi  ABDOMEN: Soft, non-tender, non-distended MUSCULOSKELETAL:  No edema; No deformity  SKIN: Warm and dry LOWER EXTREMITIES: no swelling NEUROLOGIC:  Alert and oriented x 3 PSYCHIATRIC:  Normal affect   ASSESSMENT:    1. Paroxysmal atrial fibrillation (HCC)   2. Benign hypertension   3. Elevated coronary artery calcium score   4. Mixed hyperlipidemia    PLAN:    In order of problems listed above:  Paroxysmal atrial fibrillation, maintained sinus rhythm continue amiodarone , will recheck a TSH and liver function  test Aortic stenosis mild to moderate based on last echocardiogram next year will repeat echocardiogram no signs and symptoms of worsening deconditioning. Elevated calcium score asymptomatic continue monitoring. Mixed dyslipidemia follow-up by primary care physician, last blood test done January 03, 2024 HDL 81 LDL 91 continue present management   Medication Adjustments/Labs and Tests Ordered: Current medicines are reviewed at length with the patient today.  Concerns regarding medicines are outlined above.  Orders Placed This Encounter  Procedures   EKG 12-Lead   Medication changes: No orders of the defined types were placed in this encounter.   Signed, Lamar DOROTHA Fitch, MD, [facility] 02/24/2024 3:42 PM    [facility]

## 2024-02-24 NOTE — Addendum Note (Signed)
 Addended by: ONEITA BERLINER on: 02/24/2024 03:48 PM   Modules accepted: Orders

## 2024-02-24 NOTE — Patient Instructions (Signed)
 Medication Instructions:  Your physician recommends that you continue on your current medications as directed. Please refer to the Current Medication list given to you today.  *If you need a refill on your cardiac medications before your next appointment, please call your pharmacy*   Lab Work: Your physician recommends that you have a TSH and LFT's today in the office.  If you have labs (blood work) drawn today and your tests are completely normal, you will receive your results only by: MyChart Message (if you have MyChart) OR A paper copy in the mail If you have any lab test that is abnormal or we need to change your treatment, we will call you to review the results.   Testing/Procedures: None ordered   Follow-Up: At [facility], you and your health needs are our priority.  As part of our continuing mission to provide you with exceptional heart care, we have created designated Provider Care Teams.  These Care Teams include your primary Cardiologist (physician) and Advanced Practice Providers (APPs -  Physician Assistants and Nurse Practitioners) who all work together to provide you with the care you need, when you need it.  We recommend signing up for the patient portal called MyChart.  Sign up information is provided on this After Visit Summary.  MyChart is used to connect with patients for Virtual Visits (Telemedicine).  Patients are able to view lab/test results, encounter notes, upcoming appointments, etc.  Non-urgent messages can be sent to your provider as well.   To learn more about what you can do with MyChart, go to [email or web address].    Your next appointment:   6 month(s)  The format for your next appointment:   In Person  Provider:   Lamar Fitch, MD    Other Instructions none  Important Information About Sugar

## 2024-02-25 ENCOUNTER — Telehealth: Payer: Self-pay | Admitting: Cardiology

## 2024-02-25 ENCOUNTER — Other Ambulatory Visit: Payer: Self-pay | Admitting: Cardiology

## 2024-02-25 DIAGNOSIS — I480 Paroxysmal atrial fibrillation: Secondary | ICD-10-CM

## 2024-02-25 LAB — HEPATIC FUNCTION PANEL
ALT: 8 IU/L (ref 0–32)
AST: 18 IU/L (ref 0–40)
Albumin: 4 g/dL (ref 3.7–4.7)
Alkaline Phosphatase: 62 IU/L (ref 48–129)
Bilirubin Total: 0.4 mg/dL (ref 0.0–1.2)
Bilirubin, Direct: 0.15 mg/dL (ref 0.00–0.40)
Total Protein: 6.5 g/dL (ref 6.0–8.5)

## 2024-02-25 LAB — TSH: TSH: 1.29 u[IU]/mL (ref 0.450–4.500)

## 2024-02-25 MED ORDER — APIXABAN 2.5 MG PO TABS: 2.5000 mg | ORAL_TABLET | Freq: Two times a day (BID) | ORAL | 1 refills | Status: DC

## 2024-02-25 NOTE — Telephone Encounter (Signed)
*  STAT* If patient is at the pharmacy, call can be transferred to refill team.   1. Which medications need to be refilled? (please list name of each medication and dose if known)   apixaban  (ELIQUIS ) 2.5 MG TABS tablet  metoprolol  tartrate (LOPRESSOR ) 25 MG tablet  ENTRESTO  24-26 MG  furosemide  (LASIX ) 20 MG tablet   2. Would you like to learn more about the convenience, safety, & potential cost savings by using the [facility]?   3. Are you open to using the [facility] (Type [facility]. ).  4. Which pharmacy/location (including street and city if local pharmacy) is medication to be sent to?  [facility] - [place], [place] - [address]   5. Do they need a 30 day or 90 day supply?   90 day   Patient stated she still has some medication left.

## 2024-02-25 NOTE — Telephone Encounter (Signed)
 Prescription refill request for Eliquis  received. Indication: A FIB Last office visit: 02/24/24 Scr: 1.20 (12/19/22 scanned Lab from [facility]) Age: 83 Weight: 117 lbs.   Patient is already on lowest dose, but she does not have recent lab work. Will send to pharmacy.

## 2024-03-03 ENCOUNTER — Other Ambulatory Visit: Payer: Self-pay | Admitting: Cardiology

## 2024-03-05 ENCOUNTER — Ambulatory Visit: Payer: Self-pay | Admitting: Cardiology

## 2024-04-13 ENCOUNTER — Other Ambulatory Visit: Payer: Self-pay | Admitting: Cardiology

## 2024-04-13 DIAGNOSIS — I480 Paroxysmal atrial fibrillation: Secondary | ICD-10-CM
